# Patient Record
Sex: Female | Born: 1955 | Race: White | Hispanic: No | Marital: Married | Smoking: Never smoker
Health system: Southern US, Community
[De-identification: ages and names within clinical notes are randomized; demographics above are authoritative.]

## PROBLEM LIST (undated history)

## (undated) DIAGNOSIS — K219 Gastro-esophageal reflux disease without esophagitis: Secondary | ICD-10-CM

## (undated) DIAGNOSIS — E039 Hypothyroidism, unspecified: Secondary | ICD-10-CM

## (undated) DIAGNOSIS — I214 Non-ST elevation (NSTEMI) myocardial infarction: Secondary | ICD-10-CM

## (undated) DIAGNOSIS — F419 Anxiety disorder, unspecified: Secondary | ICD-10-CM

## (undated) DIAGNOSIS — G4730 Sleep apnea, unspecified: Secondary | ICD-10-CM

## (undated) DIAGNOSIS — I639 Cerebral infarction, unspecified: Secondary | ICD-10-CM

## (undated) DIAGNOSIS — G43909 Migraine, unspecified, not intractable, without status migrainosus: Secondary | ICD-10-CM

## (undated) DIAGNOSIS — I10 Essential (primary) hypertension: Secondary | ICD-10-CM

## (undated) DIAGNOSIS — I2510 Atherosclerotic heart disease of native coronary artery without angina pectoris: Secondary | ICD-10-CM

## (undated) DIAGNOSIS — M1990 Unspecified osteoarthritis, unspecified site: Secondary | ICD-10-CM

## (undated) HISTORY — PX: SHOULDER ARTHROSCOPY: SHX128

## (undated) HISTORY — PX: ABDOMINAL HYSTERECTOMY: SHX81

## (undated) HISTORY — PX: NASAL FRACTURE SURGERY: SHX718

## (undated) HISTORY — DX: Cerebral infarction, unspecified: I63.9

## (undated) HISTORY — PX: TONSILLECTOMY: SUR1361

## (undated) HISTORY — PX: CARDIAC CATHETERIZATION: SHX172

## (undated) HISTORY — DX: Non-ST elevation (NSTEMI) myocardial infarction: I21.4

---

## 1999-12-18 ENCOUNTER — Ambulatory Visit (HOSPITAL_COMMUNITY): Admission: RE | Admit: 1999-12-18 | Discharge: 1999-12-18 | Payer: Self-pay | Admitting: Family Medicine

## 2000-01-01 ENCOUNTER — Ambulatory Visit (HOSPITAL_COMMUNITY): Admission: RE | Admit: 2000-01-01 | Discharge: 2000-01-01 | Payer: Self-pay | Admitting: Family Medicine

## 2002-10-02 ENCOUNTER — Emergency Department (HOSPITAL_COMMUNITY): Admission: EM | Admit: 2002-10-02 | Discharge: 2002-10-02 | Payer: Self-pay | Admitting: Emergency Medicine

## 2002-10-02 ENCOUNTER — Encounter: Payer: Self-pay | Admitting: Emergency Medicine

## 2008-09-07 DIAGNOSIS — E559 Vitamin D deficiency, unspecified: Secondary | ICD-10-CM | POA: Insufficient documentation

## 2009-01-02 ENCOUNTER — Encounter: Admission: RE | Admit: 2009-01-02 | Discharge: 2009-01-02 | Payer: Self-pay | Admitting: Orthopedic Surgery

## 2009-03-12 DIAGNOSIS — I10 Essential (primary) hypertension: Secondary | ICD-10-CM | POA: Diagnosis present

## 2009-04-09 ENCOUNTER — Ambulatory Visit (HOSPITAL_BASED_OUTPATIENT_CLINIC_OR_DEPARTMENT_OTHER): Admission: RE | Admit: 2009-04-09 | Discharge: 2009-04-09 | Payer: Self-pay | Admitting: Orthopedic Surgery

## 2010-12-07 LAB — POCT I-STAT, CHEM 8
BUN: 10 mg/dL (ref 6–23)
Calcium, Ion: 1 mmol/L — ABNORMAL LOW (ref 1.12–1.32)
Chloride: 104 mEq/L (ref 96–112)
Creatinine, Ser: 0.7 mg/dL (ref 0.4–1.2)
Glucose, Bld: 90 mg/dL (ref 70–99)
HCT: 41 % (ref 36.0–46.0)
Hemoglobin: 13.9 g/dL (ref 12.0–15.0)
Potassium: 4 mEq/L (ref 3.5–5.1)
Sodium: 138 mEq/L (ref 135–145)
TCO2: 26 mmol/L (ref 0–100)

## 2011-01-14 NOTE — Op Note (Signed)
Kelly Warner, HAUTH             ACCOUNT NO.:  [id number]   MEDICAL RECORD NO.:  [id number]          PATIENT TYPE:  AMB   LOCATION:  DSC                          FACILITY:  [facility]   PHYSICIAN:  Feliberto Gottron. Turner Daniels, M.D.   DATE OF BIRTH:  08/21/1956   DATE OF PROCEDURE:  04/09/2009  DATE OF DISCHARGE:                               OPERATIVE REPORT   PREOPERATIVE DIAGNOSIS:  Right shoulder impingement syndrome.   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   PROCEDURE:  Right shoulder arthroscopic anterior-inferior acromioplasty,  formal distal clavicle excision.   SURGEON:  Feliberto Gottron. Turner Daniels, MD   FIRST ASSISTANT:  Shirl Harris, PA-C   ANESTHETIC:  Right shoulder interscalene block plus general  endotracheal.   ESTIMATED BLOOD LOSS:  Minimal.   FLUID REPLACEMENT:  1200 mL of crystalloid.   DRAINS PLACED:  None.   TOURNIQUET TIME:  None.   INDICATIONS FOR PROCEDURE:  The patient is a 55 year old woman with  right shoulder impingement syndrome and AC joint arthritis, treated by  one of my partners Dr. Renae Fickle until recently when Dr. Renae Fickle retired from  surgical practice.  Because of persistent pain, she desires elective  arthroscopic evaluation and treatment of right shoulder.  Preoperative  MRI scan done by Dr. Renae Fickle showed a partial-thickness rotator cuff tear,  but no full-thickness tears in this 55 year old woman.  She has failed  conservative treatment with anti-inflammatory medicines, physical  therapy, good temporary relief with cortisone injections x2 and now  desires elective arthroscopic evaluation, treatment of her shoulder with  decompression, removing a type 3 subacromial spur that is about a  centimeter in size as well as an arthritic AC joint with a subclavicular  spur.  The risks and benefits of surgery have been discussed, questions  answered.   DESCRIPTION OF PROCEDURE:  The patient was  identified by armband and  taken to operating room #1 at [facility] after she had  successful induction of right shoulder interscalene block in the block  area.  Once in operating room #1, the appropriate anesthetic monitors  were reattached and general endotracheal anesthesia induced with the  patient in supine position.  She was then placed in the beach-chair  position and the right upper extremity was prepped and draped in usual  sterile fashion from the wrist to the hemithorax.  Time-out procedure  performed and we began the operation itself by making standard portals  of 1.5 cm anterior to the [facility] joint lateral to the junction of middle and  posterior thirds of the acromion and posterior to the posterolateral  corner of the acromion process.  The inflow was placed anteriorly, the  arthroscope laterally and a 4.2 great white sucker shaver posteriorly  allowing subacromial bursectomy.  We was also used the ArthroCare wand  to delineate subacromial spur and release part of the CA ligament.  The  rotator cuff was evaluated and did not have a partial-thickness external  leaflet rotator cuff tear that involved for about 25% of the tendon  anterior leading edge.  We  then selected a 4.5 hooded vortex bur and  performed a subacromial decompression creating a type 1 flat acromion.  We also removed the subclavicular spur, switched portals, bringing the  scope in posteriorly, the inflow laterally and the bur anteriorly  completing a formal distal clavicle excision.  The arthroscope was then  repositioned into the glenohumeral joint using the posterior portal with  the inflow attached where we documented normal glenohumeral cartilage,  normal labrum, normal biceps, biceps anchor, and a normal internal  leaflets of the subscapularis, supraspinatus, and infraspinatus tendons.  At this point, the shoulder was irrigated out with normal saline  solution.  The arthroscopic instrument was  removed.  A dressing of  Xeroform, 4 x 4 dressing sponges, paper tape, and a sling was applied.  The patient was laid supine, awakened, and taken to the recovery room  without difficulty.      Feliberto Gottron. Turner Daniels, M.D.  Electronically Signed     FJR/MEDQ  D:  04/09/2009  T:  04/10/2009  Job:  [id number]

## 2015-01-09 ENCOUNTER — Other Ambulatory Visit: Payer: Self-pay | Admitting: Gastroenterology

## 2015-01-09 DIAGNOSIS — R110 Nausea: Secondary | ICD-10-CM

## 2015-01-12 ENCOUNTER — Ambulatory Visit (HOSPITAL_COMMUNITY)
Admission: RE | Admit: 2015-01-12 | Discharge: 2015-01-12 | Disposition: A | Discharge status: Home / Self Care | Payer: PRIVATE HEALTH INSURANCE | Source: Ambulatory Visit | Attending: Gastroenterology | Admitting: Gastroenterology

## 2015-01-12 DIAGNOSIS — R110 Nausea: Secondary | ICD-10-CM

## 2015-01-12 DIAGNOSIS — R109 Unspecified abdominal pain: Secondary | ICD-10-CM | POA: Insufficient documentation

## 2015-09-20 DIAGNOSIS — H93299 Other abnormal auditory perceptions, unspecified ear: Secondary | ICD-10-CM | POA: Insufficient documentation

## 2015-09-20 DIAGNOSIS — M8580 Other specified disorders of bone density and structure, unspecified site: Secondary | ICD-10-CM | POA: Insufficient documentation

## 2015-09-20 DIAGNOSIS — K219 Gastro-esophageal reflux disease without esophagitis: Secondary | ICD-10-CM | POA: Diagnosis present

## 2015-09-20 DIAGNOSIS — L821 Other seborrheic keratosis: Secondary | ICD-10-CM | POA: Insufficient documentation

## 2015-09-20 DIAGNOSIS — L814 Other melanin hyperpigmentation: Secondary | ICD-10-CM | POA: Insufficient documentation

## 2015-09-20 DIAGNOSIS — J309 Allergic rhinitis, unspecified: Secondary | ICD-10-CM | POA: Insufficient documentation

## 2015-09-20 DIAGNOSIS — E785 Hyperlipidemia, unspecified: Secondary | ICD-10-CM | POA: Diagnosis present

## 2015-09-20 DIAGNOSIS — G43909 Migraine, unspecified, not intractable, without status migrainosus: Secondary | ICD-10-CM | POA: Diagnosis present

## 2015-11-02 DIAGNOSIS — J0141 Acute recurrent pansinusitis: Secondary | ICD-10-CM | POA: Insufficient documentation

## 2016-09-01 DIAGNOSIS — I639 Cerebral infarction, unspecified: Secondary | ICD-10-CM

## 2016-09-01 HISTORY — DX: Cerebral infarction, unspecified: I63.9

## 2017-03-15 ENCOUNTER — Emergency Department (HOSPITAL_COMMUNITY): Payer: PRIVATE HEALTH INSURANCE

## 2017-03-15 ENCOUNTER — Inpatient Hospital Stay (HOSPITAL_COMMUNITY)
Admission: EM | Admit: 2017-03-15 | Discharge: 2017-03-17 | DRG: 065 | Disposition: A | Discharge status: Home / Self Care | Payer: PRIVATE HEALTH INSURANCE | Attending: Internal Medicine | Admitting: Internal Medicine

## 2017-03-15 ENCOUNTER — Inpatient Hospital Stay (HOSPITAL_COMMUNITY): Payer: PRIVATE HEALTH INSURANCE

## 2017-03-15 ENCOUNTER — Encounter (HOSPITAL_COMMUNITY): Payer: Self-pay | Admitting: *Deleted

## 2017-03-15 DIAGNOSIS — G43909 Migraine, unspecified, not intractable, without status migrainosus: Secondary | ICD-10-CM | POA: Diagnosis present

## 2017-03-15 DIAGNOSIS — R29701 NIHSS score 1: Secondary | ICD-10-CM | POA: Diagnosis present

## 2017-03-15 DIAGNOSIS — G8192 Hemiplegia, unspecified affecting left dominant side: Secondary | ICD-10-CM | POA: Diagnosis present

## 2017-03-15 DIAGNOSIS — K219 Gastro-esophageal reflux disease without esophagitis: Secondary | ICD-10-CM | POA: Diagnosis present

## 2017-03-15 DIAGNOSIS — I639 Cerebral infarction, unspecified: Secondary | ICD-10-CM | POA: Diagnosis present

## 2017-03-15 DIAGNOSIS — Z6832 Body mass index (BMI) 32.0-32.9, adult: Secondary | ICD-10-CM | POA: Diagnosis not present

## 2017-03-15 DIAGNOSIS — Z7982 Long term (current) use of aspirin: Secondary | ICD-10-CM | POA: Diagnosis not present

## 2017-03-15 DIAGNOSIS — E663 Overweight: Secondary | ICD-10-CM | POA: Diagnosis present

## 2017-03-15 DIAGNOSIS — Z823 Family history of stroke: Secondary | ICD-10-CM

## 2017-03-15 DIAGNOSIS — I10 Essential (primary) hypertension: Secondary | ICD-10-CM | POA: Diagnosis present

## 2017-03-15 DIAGNOSIS — I480 Paroxysmal atrial fibrillation: Secondary | ICD-10-CM | POA: Diagnosis present

## 2017-03-15 DIAGNOSIS — E785 Hyperlipidemia, unspecified: Secondary | ICD-10-CM | POA: Diagnosis present

## 2017-03-15 DIAGNOSIS — Z79899 Other long term (current) drug therapy: Secondary | ICD-10-CM

## 2017-03-15 DIAGNOSIS — R531 Weakness: Secondary | ICD-10-CM

## 2017-03-15 DIAGNOSIS — G43709 Chronic migraine without aura, not intractable, without status migrainosus: Secondary | ICD-10-CM | POA: Diagnosis not present

## 2017-03-15 DIAGNOSIS — Z90710 Acquired absence of both cervix and uterus: Secondary | ICD-10-CM

## 2017-03-15 HISTORY — DX: Essential (primary) hypertension: I10

## 2017-03-15 HISTORY — DX: Migraine, unspecified, not intractable, without status migrainosus: G43.909

## 2017-03-15 HISTORY — DX: Gastro-esophageal reflux disease without esophagitis: K21.9

## 2017-03-15 LAB — DIFFERENTIAL
Basophils Absolute: 0 10*3/uL (ref 0.0–0.1)
Basophils Relative: 0 %
Eosinophils Absolute: 0.1 10*3/uL (ref 0.0–0.7)
Eosinophils Relative: 1 %
LYMPHS PCT: 27 %
Lymphs Abs: 1.6 10*3/uL (ref 0.7–4.0)
MONO ABS: 0.4 10*3/uL (ref 0.1–1.0)
MONOS PCT: 7 %
NEUTROS ABS: 3.9 10*3/uL (ref 1.7–7.7)
Neutrophils Relative %: 65 %

## 2017-03-15 LAB — COMPREHENSIVE METABOLIC PANEL
ALK PHOS: 123 U/L (ref 38–126)
ALT: 17 U/L (ref 14–54)
AST: 20 U/L (ref 15–41)
Albumin: 3.8 g/dL (ref 3.5–5.0)
Anion gap: 8 (ref 5–15)
BILIRUBIN TOTAL: 0.8 mg/dL (ref 0.3–1.2)
BUN: 14 mg/dL (ref 6–20)
CALCIUM: 9.1 mg/dL (ref 8.9–10.3)
CO2: 25 mmol/L (ref 22–32)
CREATININE: 0.82 mg/dL (ref 0.44–1.00)
Chloride: 105 mmol/L (ref 101–111)
Glucose, Bld: 116 mg/dL — ABNORMAL HIGH (ref 65–99)
Potassium: 3.9 mmol/L (ref 3.5–5.1)
Sodium: 138 mmol/L (ref 135–145)
Total Protein: 6.9 g/dL (ref 6.5–8.1)

## 2017-03-15 LAB — I-STAT CHEM 8, ED
BUN: 16 mg/dL (ref 6–20)
CREATININE: 0.7 mg/dL (ref 0.44–1.00)
Calcium, Ion: 1.1 mmol/L — ABNORMAL LOW (ref 1.15–1.40)
Chloride: 103 mmol/L (ref 101–111)
GLUCOSE: 114 mg/dL — AB (ref 65–99)
HCT: 44 % (ref 36.0–46.0)
HEMOGLOBIN: 15 g/dL (ref 12.0–15.0)
POTASSIUM: 3.9 mmol/L (ref 3.5–5.1)
Sodium: 140 mmol/L (ref 135–145)
TCO2: 25 mmol/L (ref 0–100)

## 2017-03-15 LAB — APTT: aPTT: 30 seconds (ref 24–36)

## 2017-03-15 LAB — CBC
HEMATOCRIT: 43.9 % (ref 36.0–46.0)
Hemoglobin: 14.5 g/dL (ref 12.0–15.0)
MCH: 29.1 pg (ref 26.0–34.0)
MCHC: 33 g/dL (ref 30.0–36.0)
MCV: 88.2 fL (ref 78.0–100.0)
Platelets: 288 10*3/uL (ref 150–400)
RBC: 4.98 MIL/uL (ref 3.87–5.11)
RDW: 14.1 % (ref 11.5–15.5)
WBC: 6 10*3/uL (ref 4.0–10.5)

## 2017-03-15 LAB — I-STAT TROPONIN, ED: TROPONIN I, POC: 0.01 ng/mL (ref 0.00–0.08)

## 2017-03-15 LAB — PROTIME-INR
INR: 1.08
Prothrombin Time: 14.1 seconds (ref 11.4–15.2)

## 2017-03-15 LAB — CBG MONITORING, ED: Glucose-Capillary: 114 mg/dL — ABNORMAL HIGH (ref 65–99)

## 2017-03-15 MED ORDER — PANTOPRAZOLE SODIUM 40 MG PO TBEC
40.0000 mg | DELAYED_RELEASE_TABLET | Freq: Every day | ORAL | Status: DC
  Administered 2017-03-16 – 2017-03-17 (×2): 40 mg via ORAL
  Filled 2017-03-15 (×2): qty 1

## 2017-03-15 MED ORDER — ACETAMINOPHEN 325 MG PO TABS
650.0000 mg | ORAL_TABLET | ORAL | Status: DC | PRN
  Administered 2017-03-15 – 2017-03-16 (×4): 650 mg via ORAL
  Filled 2017-03-15 (×4): qty 2

## 2017-03-15 MED ORDER — ATENOLOL 25 MG PO TABS
12.5000 mg | ORAL_TABLET | Freq: Every day | ORAL | Status: DC
  Administered 2017-03-16: 12.5 mg via ORAL
  Filled 2017-03-15: qty 1

## 2017-03-15 MED ORDER — ACETAMINOPHEN 160 MG/5ML PO SOLN: 650.0000 mg | ORAL | Status: DC | PRN

## 2017-03-15 MED ORDER — STROKE: EARLY STAGES OF RECOVERY BOOK
Freq: Once | Status: AC
  Administered 2017-03-16: 03:00:00
  Filled 2017-03-15: qty 1

## 2017-03-15 MED ORDER — IOPAMIDOL (ISOVUE-370) INJECTION 76%
INTRAVENOUS | Status: AC
  Administered 2017-03-15: 50 mL
  Filled 2017-03-15: qty 50

## 2017-03-15 MED ORDER — ASPIRIN EC 81 MG PO TBEC: 81.0000 mg | DELAYED_RELEASE_TABLET | Freq: Every day | ORAL | Status: DC

## 2017-03-15 MED ORDER — ATORVASTATIN CALCIUM 40 MG PO TABS
40.0000 mg | ORAL_TABLET | Freq: Every day | ORAL | Status: DC
  Filled 2017-03-15: qty 1

## 2017-03-15 MED ORDER — SENNOSIDES-DOCUSATE SODIUM 8.6-50 MG PO TABS
1.0000 | ORAL_TABLET | Freq: Every evening | ORAL | Status: DC | PRN
  Filled 2017-03-15: qty 1

## 2017-03-15 MED ORDER — ATORVASTATIN CALCIUM 80 MG PO TABS: 80.0000 mg | ORAL_TABLET | Freq: Every day | ORAL | Status: DC

## 2017-03-15 MED ORDER — ENOXAPARIN SODIUM 40 MG/0.4ML ~~LOC~~ SOLN
40.0000 mg | SUBCUTANEOUS | Status: DC
  Administered 2017-03-16: 40 mg via SUBCUTANEOUS
  Filled 2017-03-15: qty 0.4

## 2017-03-15 MED ORDER — ACETAMINOPHEN 650 MG RE SUPP: 650.0000 mg | RECTAL | Status: DC | PRN

## 2017-03-15 MED ORDER — CITALOPRAM HYDROBROMIDE 10 MG PO TABS
20.0000 mg | ORAL_TABLET | Freq: Every day | ORAL | Status: DC
  Administered 2017-03-16 – 2017-03-17 (×2): 20 mg via ORAL
  Filled 2017-03-15 (×2): qty 2

## 2017-03-15 MED ORDER — CYCLOBENZAPRINE HCL 10 MG PO TABS: 5.0000 mg | ORAL_TABLET | Freq: Three times a day (TID) | ORAL | Status: DC | PRN

## 2017-03-15 MED ORDER — FLUTICASONE PROPIONATE 50 MCG/ACT NA SUSP
2.0000 | Freq: Every day | NASAL | Status: DC | PRN
  Filled 2017-03-15: qty 16

## 2017-03-15 NOTE — ED Notes (Signed)
Neurologist at bedside. 

## 2017-03-15 NOTE — ED Notes (Signed)
Patient transported to CT 

## 2017-03-15 NOTE — Progress Notes (Signed)
New Admission Note:  Arrival Method: stretcher from ER Mental Orientation: Alert & oriented x 4  Telemetry:box [id number] Assessment: Completed Skin: assessment complete IV: R AC Pain:5/10; pt medicated see MAR Safety Measures: Safety Fall Prevention Plan was given, discussed. Admission: Completed 5M10: Patient has been orientated to the room, unit and the staff. Family: husband at beside   Orders have been reviewed and implemented. Will continue to monitor the patient. Call light has been placed within reach and bed alarm has been activated.   Arta Silence ,RN

## 2017-03-15 NOTE — H&P (Signed)
Date: 03/15/2017               Patient Name:  Kelly Warner MRN: [id number]  DOB: Aug 23, 1956 Age / Sex: 61 y.o., female   PCP: System, Pcp Not In         Medical Service: Internal Medicine Teaching Service         Attending Physician: Dr. Sherry Ruffing, Gwenyth Allegra, *    First Contact: Dr. Maricela Bo Pager: [phone]  Second Contact: Dr. Benjamine Mola Pager: [phone]       After Hours (After 5p/  First Contact Pager: [phone]  weekends / holidays): Second Contact Pager: [phone]   Chief Complaint: Extremity weakness   History of Present Illness:   Kelly Warner is a 61 y.o f with pmh of htn, migraine headaches, and gerd who presented to the ed following an episode of acute lower extremity weakness. Kelly Warner woke up this morning at 4/5am because she had a headache and neck pain. She put an ice pack on her head and subsequently went to sleep. After waking up her and her husband decided to go on a walk with Kelly Warner around 8am. As Kelly Warner was walking she heard her feet dragging and she felt that both her left arm and leg felt rubbery. She denies any dizziness, numbness or tingling. She does say that she felt weak and would have possibly fallen if not for holding as stroller. She states she took an aspirn 325mg  and then subsequently came to the emergency room.   Kelly Warner states that she has been having a headache for the past week that is 6-7/10 intensity, in the left frontal area, pressure type pain, worsens with loud noises and light. She got a massage last week in attempt to alleviate the pain, but that did not help. She uses zolmitriptan 5mg  for her headaches which she last used on Thursday 03/12/17.  Kelly Warner states has not had a stroke in the past. She states that she is compliant with all her medication.   ED Course- Ct head and MRI brain, EKG, neurology consult  Meds:  Current Meds  Medication Sig  . aspirin EC 325 MG tablet Take 325 mg by mouth daily as needed for mild pain.    Marland Kitchen atenolol (TENORMIN) 25 MG tablet Take 12.5 mg by mouth daily.   . citalopram (CELEXA) 20 MG tablet Take 20 mg by mouth daily.  . cyclobenzaprine (FLEXERIL) 5 MG tablet Take 5 mg by mouth 3 (three) times daily as needed for muscle spasms.   Marland Kitchen loratadine (CLARITIN) 10 MG tablet Take 10 mg by mouth daily as needed for allergies.  . mometasone (NASONEX) 50 MCG/ACT nasal spray Place 2 sprays into the nose daily as needed (congestion).  . naproxen sodium (ANAPROX) 220 MG tablet Take 220 mg by mouth daily as needed (pain).  . pantoprazole (PROTONIX) 40 MG tablet Take 40 mg by mouth daily.  Marland Kitchen zolmitriptan (ZOMIG) 5 MG tablet Take 5 mg by mouth as needed for migraine.  . [DISCONTINUED] mometasone (NASONEX) 50 MCG/ACT nasal spray 2 SPRAYS BY NASAL ROUTE DAILY prn     Allergies: Allergies as of 03/15/2017  . (No Known Allergies)   Past Medical History:  Diagnosis Date  . Hypertension   . Migraines     Family History:   Mother-afib, dementia, hemorrhagic stroke at age 38 with dementia, htn   Father- vfib at age 62, htn  Brother-htn  Sister-htn  Social History:  Denies smoking  and illicit drugs Drinks alcohol occasionally  Review of Systems: A complete ROS was negative except as per HPI.  Physical Exam: Blood pressure 123/72, pulse (!) 56, temperature 98.2 F (36.8 C), temperature source Oral, resp. rate 16, height 5\' 4"  (1.626 m), weight 180 lb (81.6 kg), SpO2 96 %.  Physical Exam  Constitutional: She appears distressed.  HENT:  Head: Normocephalic and atraumatic.  Cardiovascular: Normal rate, regular rhythm, normal heart sounds and intact distal pulses.   Pulmonary/Chest: Effort normal and breath sounds normal. No respiratory distress. She has no wheezes. She exhibits no tenderness.  Abdominal: Soft. Bowel sounds are normal. She exhibits no distension. There is no tenderness.  Neurological: She is alert. She displays normal reflexes. No cranial nerve deficit. Coordination  normal.  Reflex Scores:      Patellar reflexes are 2+ on the right side and 2+ on the left side.      Achilles reflexes are 2+ on the right side and 2+ on the left side. Right upper and lower extremity strength 5/5 Left upper and lower extremity strength 4/5  Psychiatric: Mood, memory, affect and judgment normal.    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    Assessment & Plan by Problem:  Acute Ischemic Stroke: The patient's age and htn are risk factors for stroke. She does not have any large deficits on exam except for slight weakness on her left upper and lower extremities. She has no history of coagulopathy, dm, or hyperlipidemia. In order to further assess cause of the stoke we have ordered  - CTangio head and neck, lipid panel, HbA1c, and echocardiogram.  Her family history of atrial fibrillation causes suspicion as to whether a paroxysmal atrial fibrillation caused her stroke. Furthermore, the patient has been on zolmitriptan. There has been some research which show the usage of triptans being linked to onset of ischemic stroke due to the vasoconstricting properties of triptan agents. It is also thought that migraine can also independently be a possible cause of stroke due to vasospasm causing changes in cerebral blood flow, increased platelet aggregability, and endothelial abnormalities.   -stop zolmitriptan due to contraindication in ischemic strokes -PT/OT -Lovenox 40mg   -Neuro checks q 2hrs x12 hrs  HTN: Blood pressure has ranged 117-140/73-76 over the past 24 hrs. Contineue atenolol 12.5mg   GERD: Continue with home pantoprazole   Dispo: Admit patient to Inpatient with expected length of stay greater than 2 midnights.  Signed: Lars Mage, MD Internal Medicine PGY1 [phone] 03/15/2017, 6:02 PM

## 2017-03-15 NOTE — ED Triage Notes (Signed)
Pt with L sided weakness "feels rubbery", acute onset at 0830 this am.  No deficits noted on exam.  Hx of migraines with L parietal headache x 1 week that improved with massage.

## 2017-03-15 NOTE — ED Notes (Signed)
Patient transported to MRI 

## 2017-03-15 NOTE — Consult Note (Addendum)
Referring Physician: Dr Leonides Schanz    Chief Complaint:  Left-sided weakness  HPI: Kelly Warner is a 61 y.o. female  with a history of mild hypertension and migraine headaches. Last week the patient had neck and occipital pain which was different from her usual migraine headaches. She did take migraine medication with no relief. She finally put an ice pack to the area. She had intermittent relief. This morning she awoke at approximately 4 or 5 AM again with neck and occipital pain. She applied an ice pack and fell back to sleep. She later got up at around 8 AM and with her husband and granddaughter went for a short walk outside. She noticed that her left arm and left leg felt heavy. She began to drag her left foot. They went back to the house. She took an aspirin 325 mgs and went to lie on the couch. She was still having symptoms after she had rested. They decided to come to the emergency room for further evaluation. Currently she feels that her left arm is back to normal. She still feels heaviness in her left leg. She is not on antiplatelet therapy on a regular basis. She takes aspirin as needed for mild discomfort. An MRI revealed an acute nonhemorrhagic 8 mm white matter infarct within the right corona and radiata, potentially involving the cortical spinal tracts.   Date last known well: Date: 03/15/2017 Time last known well: Time: 05:00 tPA Given: No: Minimal deficits which appear to be improving.  Past Medical History:  Diagnosis Date  . Hypertension   . Migraines     Past Surgical History:  Procedure Laterality Date  . ABDOMINAL HYSTERECTOMY    . NASAL FRACTURE SURGERY    . SHOULDER ARTHROSCOPY Right   . TONSILLECTOMY      No family history on file. The patient's mother had a hemorrhagic stroke in her 45s. Her mother also had atrial fibrillation. Social History:  reports that she has never smoked. She has never used smokeless tobacco. She reports that she drinks alcohol. She reports that  she does not use drugs.  Allergies: No Known Allergies  Medications:  Current Facility-Administered Medications  Medication Dose Route Frequency Provider Last Rate Last Dose  .  stroke: mapping our early stages of recovery book   Does not apply Once Collier Salina, MD      . acetaminophen (TYLENOL) tablet 650 mg  650 mg Oral Q4H PRN Rice, Resa Miner, MD       Or  . acetaminophen (TYLENOL) solution 650 mg  650 mg Per Tube Q4H PRN Rice, Resa Miner, MD       Or  . acetaminophen (TYLENOL) suppository 650 mg  650 mg Rectal Q4H PRN Collier Salina, MD      . Derrill Memo ON 03/16/2017] aspirin EC tablet 81 mg  81 mg Oral Daily Rice, Resa Miner, MD      . atenolol (TENORMIN) tablet 12.5 mg  12.5 mg Oral Daily Rice, Resa Miner, MD      . citalopram (CELEXA) tablet 20 mg  20 mg Oral Daily Rice, Resa Miner, MD      . cyclobenzaprine (FLEXERIL) tablet 5 mg  5 mg Oral TID PRN Collier Salina, MD      . enoxaparin (LOVENOX) injection 40 mg  40 mg Subcutaneous Q24H Rice, Resa Miner, MD      . fluticasone (FLONASE) 50 MCG/ACT nasal spray 2 spray  2 spray Each Nare Daily PRN Collier Salina,  MD      . pantoprazole (PROTONIX) EC tablet 40 mg  40 mg Oral Daily Rice, Resa Miner, MD      . senna-docusate (Senokot-S) tablet 1 tablet  1 tablet Oral QHS PRN Collier Salina, MD       Current Outpatient Prescriptions  Medication Sig Dispense Refill  . aspirin EC 325 MG tablet Take 325 mg by mouth daily as needed for mild pain.    Marland Kitchen atenolol (TENORMIN) 25 MG tablet Take 12.5 mg by mouth daily.     . citalopram (CELEXA) 20 MG tablet Take 20 mg by mouth daily.    . cyclobenzaprine (FLEXERIL) 5 MG tablet Take 5 mg by mouth 3 (three) times daily as needed for muscle spasms.   0  . loratadine (CLARITIN) 10 MG tablet Take 10 mg by mouth daily as needed for allergies.    . mometasone (NASONEX) 50 MCG/ACT nasal spray Place 2 sprays into the nose daily as needed (congestion).    .  naproxen sodium (ANAPROX) 220 MG tablet Take 220 mg by mouth daily as needed (pain).    . pantoprazole (PROTONIX) 40 MG tablet Take 40 mg by mouth daily.  3  . zolmitriptan (ZOMIG) 5 MG tablet Take 5 mg by mouth as needed for migraine.     I have reviewed the patient's current medications.  ROS: History obtained from the patient  General ROS: negative for - chills, fatigue, fever, night sweats, weight gain or weight loss. Positive for recent occipital headaches and neck pain. Psychological ROS: negative for - behavioral disorder, hallucinations, memory difficulties, mood swings or suicidal ideation Ophthalmic ROS: negative for - blurry vision, double vision, eye pain or loss of vision. Positive for recent blurred vision in her left eye. Seen by her ophthalmologist who told her she had a corneal ulcer. ENT ROS: negative for - epistaxis, nasal discharge, oral lesions, sore throat, tinnitus or vertigo Allergy and Immunology ROS: negative for - hives or itchy/watery eyes Hematological and Lymphatic ROS: negative for - bleeding problems, bruising or swollen lymph nodes Endocrine ROS: negative for - galactorrhea, hair pattern changes, polydipsia/polyuria or temperature intolerance Respiratory ROS: negative for - cough, hemoptysis, shortness of breath or wheezing. Positive for shortness of breath during a hike last weekend with some mild chest discomfort. Cardiovascular ROS: negative for - chest pain, dyspnea on exertion, edema or irregular heartbeat Gastrointestinal ROS: negative for - abdominal pain, diarrhea, hematemesis, nausea/vomiting or stool incontinence Genito-Urinary ROS: negative for - dysuria, hematuria, incontinence or urinary frequency/urgency Musculoskeletal ROS: negative for - joint swelling or muscular weakness Neurological ROS: as noted in HPI Dermatological ROS: negative for rash and skin lesion changes   Physical Examination: Blood pressure 123/72, pulse (!) 56, temperature  98.2 F (36.8 C), temperature source Oral, resp. rate 16, height 5\' 4"  (1.626 m), weight 81.6 kg (180 lb), SpO2 96 %.  General - pleasant 61 year old female in no acute distress Heart - Regular rate and rhythm - no murmer appreciated Lungs - Clear to auscultation Abdomen - Soft - non tender Extremities - Distal pulses intact - no edema Skin - Warm and dry   Neurologic Examination:  Mental Status:  Alert, oriented, thought content appropriate. Speech without evidence of dysarthria or aphasia. Able to follow 3 step commands without difficulty.  Cranial Nerves:  II-bilateral visual fields intact III/IV/VI-Pupils were equal and reacted. Extraocular movements were full.  V/VII-no facial numbness and no facial weakness.  VIII-hearing normal.  X-normal speech and symmetrical palatal movement.  XII-midline tongue extension  Motor: 5/5 strength symmetrical throughout.  Muscle tone normal throughout. Sensory: Sensation decreased to light touch in the left upper and lower extremities.. Deep Tendon Reflexes: 2/4 throughout Plantars: Downgoing bilaterally  Cerebellar: Normal finger to nose and heel to shin bilaterally. Gait: not tested   Laboratory Studies:  Basic Metabolic Panel:  Recent Labs Lab 03/15/17 1029 03/15/17 1045  NA 138 140  K 3.9 3.9  CL 105 103  CO2 25  --   GLUCOSE 116* 114*  BUN 14 16  CREATININE 0.82 0.70  CALCIUM 9.1  --     Liver Function Tests:  Recent Labs Lab 03/15/17 1029  AST 20  ALT 17  ALKPHOS 123  BILITOT 0.8  PROT 6.9  ALBUMIN 3.8   No results for input(s): LIPASE, AMYLASE in the last 168 hours. No results for input(s): AMMONIA in the last 168 hours.  CBC:  Recent Labs Lab 03/15/17 1029 03/15/17 1045  WBC 6.0  --   NEUTROABS 3.9  --   HGB 14.5 15.0  HCT 43.9 44.0  MCV 88.2  --   PLT 288  --     Cardiac Enzymes: No results for input(s): CKTOTAL, CKMB, CKMBINDEX, TROPONINI in the last 168 hours.  BNP: Invalid input(s):  POCBNP  CBG:  Recent Labs Lab 03/15/17 1050  GLUCAP 114*    Microbiology: No results found for this or any previous visit.  Coagulation Studies:  Recent Labs  03/15/17 1029  LABPROT 14.1  INR 1.08    Urinalysis: No results for input(s): COLORURINE, LABSPEC, PHURINE, GLUCOSEU, HGBUR, BILIRUBINUR, KETONESUR, PROTEINUR, UROBILINOGEN, NITRITE, LEUKOCYTESUR in the last 168 hours.  Invalid input(s): APPERANCEUR  Lipid Panel: No results found for: CHOL, TRIG, HDL, CHOLHDL, VLDL, LDLCALC  HgbA1C: No results found for: HGBA1C  Urine Drug Screen:  No results found for: LABOPIA, COCAINSCRNUR, LABBENZ, AMPHETMU, THCU, LABBARB  Alcohol Level: No results for input(s): ETH in the last 168 hours.  Other results: EKG: Sinus bradycardia rate 59 bpm. No obvious ischemia. Please refer to the formal cardiology reading for complete details.  Imaging:  Ct Head Wo Contrast 03/15/2017 No significant abnormality.   Mr Brain Wo Contrast 03/15/2017 1. Acute nonhemorrhagic 8 mm white matter infarct within the right corona and radiata, potentially involving the cortical spinal tracts.  2. No other acute intracranial abnormality or evidence for significant previous ischemia.       Assessment: 61 y.o. female with a history of hypertension and migraine headaches who developed left-sided heaviness / weakness while outside walking with her husband and granddaughter this morning between 8:30 and 9 AM. Her left upper extremity has improved. Her left lower extremity still feels heavy although strength appears to be equal throughout on exam. She has mildly decreased sensation in both the left upper and lower extremities. MRI is consistent with a right corona radiata infarct. The patient will be admitted by the teaching service for a full stroke workup. Dr. Rory Percy recommended a CT angiogram of the head and neck which has been ordered.  Stroke Risk Factors - hypertension, overweight, migraine headaches,  and family history. Lipid status unknown.  Plan: HgbA1c, fasting lipid panel PT consult, OT consult, Speech consult Echocardiogram CTA head and neck Prophylactic therapy- aspirin 325 mg daily NPO until RN stroke swallow screen Telemetry monitoring Frequent neuro checks   Further impression and plan to follow per Dr. Caryl Comes Rinehuls PA-C [facility] Pager [phone] 03/15/2017, 5:21 PM   NEUROHOSPITALIST ADDENDUM  Patient seen and examined in the emergency room. Please see the detailed history documented above. Briefly, 61 year old woman with a history of hypertension and migraine headaches who had some ongoing headache for about a week woke up this morning about 5 AM with a headache, went about her day and during the course of the day felt heaviness in the left leg and felt that she was dragging her left foot while walking. She returned back to baseline. She took aspirin and went to rest. She continued to have some symptoms including some sensory changes and that prompted her to come to the emergency room. Last known well-unclear, but greater than 12 hours ago. Not a candidate for TPA due to being outside the time window a  Review of systems documented above positive for headaches, blurred vision because of her corneal ulcer, or is negative. A full 14 point review of systems was conducted.  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  Family history significant for hemorrhagic stroke in the mother.  On examination, her vital signs are stable. Well-developed well-nourished acute distress S1-S2 heard, regular rate rhythm no murmur or gallop Chest clear to auscultation Soft nontender abdomen Warm well-perfused extremities with normal pulses Neurological exam She is alert, awake oriented 3. Normal speech. No dysarthria. No aphasia. Following all commands. Cranial nerve exam: Pupils equal round reactive to  light, visual fields full, movements intact, face symmetric, facial sensation intact bilaterally, palate elevates symmetrically shoulder shrug intact and tongue is midline. Motor exam: 5/5 strength in all 4 extremities with normal tone and normal range of motion Sensory exam reveals decreased sensation to light touch on the left upper and lower extremity. Coordination intact finger-nose-finger and heel shin Deep tendon reflexes: 2+ all over, downgoing toes bilaterally. Gait was not tested at this time.  Pertinent labs reviewed: Unremarkable BMP and CBC.  Imaging: Noncontrast CT of the head shows no acute changes. No bleeds MRI of the brain without contrast shows a small area of restricted diffusion in the right corona radiata. No other infarcts noted. No evidence of bleed.  Medication list Aspirin, atenolol, citalopram, Flexeril, loratadine, Nasonex, Naprosyn, metoprolol, Zomig  NIHSS 1a Level of Conscious.:  1b LOC Questions:  1c LOC Commands:  2 Best Gaze:  3 Visual:  4 Facial Palsy:  5a Motor Arm - left:  5b Motor Arm - Right:  6a Motor Leg - Left:  6b Motor Leg - Right:  7 Limb Ataxia:  8 Sensory: 1 9 Best Language:  10 Dysarthria:  11 Extinct. and Inatten.:  TOTAL: 1  Assessment and plan 61 year old with a history of hypertension and migraine, who is also on triptan presented with left-sided weakness and paresthesias. On exam, she does have some sensory loss on the left hemibody. NIH stroke scale 01. Mild symptoms. She is not a candidate for IV TPA because her symptoms started sometime yesterday or this morning and she is well outside the 4-1/2 hour window of TPA administration. She does not have any cortical signs or evidence to suggest large vessel occlusion and has not a candidate for endovascular treatment.  The etiology of her stroke is small vessel versus embolic. Triptan as an etiology for stroke should also be considered and at this point I would recommend  discontinuing the triptan.  Impression Acute ischemic stroke Hypertension Migraine  Recommendations  1. HgbA1c, fasting lipid panel 2. CTA of the  head and neck 3. Frequent neuro checks 4. Echocardiogram  5. Carotid dopplers 6. Prophylactic therapy-Antiplatelet med: Aspirin - dose 325mg  PO or 300mg  PR. And statin - atorvastatin 80 7. Risk factor modification 8. Telemetry monitoring 9. PT consult, OT consult, Speech consult 10. D/C Triptan (Zomig)  Please page stroke NP  Or  PA  Or MD  from 8am -4 pm as this patient will be followed by the stroke team at this point.   You can look them up on [email or web address]   Amie Portland, MD [facility] [phone]  If 7pm to 7am, please call on call as listed on AMION.

## 2017-03-15 NOTE — ED Provider Notes (Signed)
[facility] DEPT Provider Note   CSN: [id number] Arrival date & time: 03/15/17  1016     History   Chief Complaint Chief Complaint  Patient presents with  . Extremity Weakness    HPI CLAUDETT BAYLY is a 61 y.o. female.  The history is provided by the patient and medical records. No language interpreter was used.  Extremity Weakness  Associated symptoms include headaches.   OKLA QAZI is a 61 y.o. female  with a PMH of HTN, migraines who presents to the Emergency Department complaining of acute onset of left upper and lower extremity weakness around 8:30 am this morning. Patient states that she awoke around 5:30 this morning with a headache. She has a history of migraines and headache felt similar. She applied ice which mildly improved symptoms and returned to bed. She is having no weakness at this time. When she awoke, she was not experiencing any weakness either. She and her husband went on a walk. Halfway through her walk, she felt as if her left arm and leg "started feeling rubbery". She will notice that she was slightly dragging her left leg while walking, however was able to continue ambulating back to the car. Her husband stated that he thought out carrying her because she was going slow, dragging her left foot and complaining that her left arm was weak as well, however she was able to get back to the car without any falls. Husband denies any changes in her speech. Patient also denies any slurred speech, but does state that while in triage she felt as if she was having to getting her words out. This has now resolved. Her symptoms are improving, however her left arm still feels slightly weak. She did take a 324 ASA prior to arrival. No other medications. She never experienced any numbness or tingling. She has never had similar signs or symptoms. No prior stroke. No hx of coagulopathy, HLD or DM.   Past Medical History:  Diagnosis Date  . Hypertension   . Migraines      Patient Active Problem List   Diagnosis Date Noted  . Acute recurrent pansinusitis 11/02/2015  . Allergic rhinitis 09/20/2015  . Hyperlipidemia 09/20/2015  . Laryngopharyngeal reflux (LPR) 09/20/2015  . Lentigo 09/20/2015  . Migraine headache 09/20/2015  . Osteopenia 09/20/2015  . Other abnormal auditory perceptions, unspecified ear 09/20/2015  . Other seborrheic keratosis 09/20/2015  . Essential hypertension 03/12/2009  . Vitamin D deficiency 09/07/2008    Past Surgical History:  Procedure Laterality Date  . ABDOMINAL HYSTERECTOMY    . NASAL FRACTURE SURGERY    . SHOULDER ARTHROSCOPY Right   . TONSILLECTOMY      OB History    No data available       Home Medications    Prior to Admission medications   Medication Sig Start Date End Date Taking? Authorizing Provider  aspirin EC 325 MG tablet Take 325 mg by mouth daily as needed for mild pain.   Yes [provider]  atenolol (TENORMIN) 25 MG tablet Take 12.5 mg by mouth daily.  10/13/16  Yes [provider]  citalopram (CELEXA) 20 MG tablet Take 20 mg by mouth daily. 12/31/16  Yes [provider]  cyclobenzaprine (FLEXERIL) 5 MG tablet Take 5 mg by mouth 3 (three) times daily as needed for muscle spasms.  12/31/16  Yes [provider]  loratadine (CLARITIN) 10 MG tablet Take 10 mg by mouth daily as needed for allergies.   Yes [provider]  mometasone (NASONEX) 50 MCG/ACT nasal spray Place 2 sprays into the nose daily as needed (congestion).   Yes [provider]  naproxen sodium (ANAPROX) 220 MG tablet Take 220 mg by mouth daily as needed (pain).   Yes [provider]  pantoprazole (PROTONIX) 40 MG tablet Take 40 mg by mouth daily. 02/04/17  Yes [provider]  zolmitriptan (ZOMIG) 5 MG tablet Take 5 mg by mouth as needed for migraine. 12/31/16  Yes [provider]    Family History History reviewed. No pertinent family history.  Social  History Social History  Substance Use Topics  . Smoking status: Never Smoker  . Smokeless tobacco: Never Used  . Alcohol use Yes     Comment: occ     Allergies   Patient has no known allergies.   Review of Systems Review of Systems  Musculoskeletal: Positive for extremity weakness.  Neurological: Positive for weakness and headaches. Negative for dizziness, syncope, facial asymmetry and numbness.  All other systems reviewed and are negative.    Physical Exam Updated Vital Signs BP 123/72   Pulse (!) 56   Temp 98.2 F (36.8 C) (Oral)   Resp 16   Ht 5\' 4"  (1.626 m)   Wt 81.6 kg (180 lb)   SpO2 96%   BMI 30.90 kg/m   Physical Exam  Constitutional: She is oriented to person, place, and time. She appears well-developed and well-nourished. No distress.  HENT:  Head: Normocephalic and atraumatic.  Cardiovascular: Normal rate, regular rhythm and normal heart sounds.   No murmur heard. Pulmonary/Chest: Effort normal and breath sounds normal. No respiratory distress.  Abdominal: Soft. She exhibits no distension. There is no tenderness.  Musculoskeletal: Normal range of motion. She exhibits no edema.  Neurological: She is alert and oriented to person, place, and time.  Alert, oriented, thought content appropriate, able to give a coherent history. Speech is clear and goal oriented, able to follow commands.  Cranial Nerves:  II:  Pupils equal, round, reactive to light III, IV, VI: EOM intact bilaterally, ptosis not present V,VII: smile symmetric, eyes kept closed tightly against resistance, facial light touch sensation equal VIII: hearing grossly normal IX, X: symmetric soft palate movement, uvula elevates symmetrically  XI: bilateral shoulder shrug symmetric and strong XII: midline tongue extension 5/5 muscle strength in upper and lower extremities bilaterally including strong and equal grip strength and dorsiflexion/plantar flexion Sensory to light touch normal in all four  extremities.  Normal finger-to-nose and rapid alternating movements;  no drift.  Skin: Skin is warm and dry.  Nursing note and vitals reviewed.    ED Treatments / Results  Labs (all labs ordered are listed, but only abnormal results are displayed) Labs Reviewed  COMPREHENSIVE METABOLIC PANEL - Abnormal; Notable for the following:       Result Value   Glucose, Bld 116 (*)    All other components within normal limits  CBG MONITORING, ED - Abnormal; Notable for the following:    Glucose-Capillary 114 (*)    All other components within normal limits  I-STAT CHEM 8, ED - Abnormal; Notable for the following:    Glucose, Bld 114 (*)    Calcium, Ion 1.10 (*)    All other components within normal limits  PROTIME-INR  APTT  CBC  DIFFERENTIAL  I-STAT TROPOININ, ED    EKG  EKG Interpretation  Date/Time:  Sunday March 15 2017 10:21:51 EDT Ventricular Rate:  59 PR Interval:  190 QRS Duration:  82 QT Interval:  442 QTC Calculation: 437 R Axis:   12 Text Interpretation:  Sinus bradycardia Low voltage QRS Borderline ECG No prior ECG.  No STEMI Confirmed by Antony Blackbird [phone]) on 03/15/2017 12:20:00 PM       Radiology Ct Head Wo Contrast  Result Date: 03/15/2017 CLINICAL DATA:  Headache for the past week. Left arm numbness this morning. Left neck pain. EXAM: CT HEAD WITHOUT CONTRAST TECHNIQUE: Contiguous axial images were obtained from the base of the skull through the vertex without intravenous contrast. COMPARISON:  Paranasal sinus CT dated 11/18/2006. FINDINGS: Brain: No evidence of acute infarction, hemorrhage, hydrocephalus, extra-axial collection or mass lesion/mass effect. Vascular: No hyperdense vessel or unexpected calcification. Skull: Normal. Negative for fracture or focal lesion. Sinuses/Orbits: Unremarkable. Other: Mild concha bullosa on the left. IMPRESSION: No significant abnormality. Electronically Signed   By: Claudie Revering M.D.   On: 03/15/2017 12:02   Mr Brain Wo  Contrast  Result Date: 03/15/2017 CLINICAL DATA:  Acute onset of left-sided weakness beginning at 8:30 a.m. today. Personal history of migraine headaches with left parietal headache over the last 1 week. EXAM: MRI HEAD WITHOUT CONTRAST TECHNIQUE: Multiplanar, multiecho pulse sequences of the brain and surrounding structures were obtained without intravenous contrast. COMPARISON:  CT head without contrast from the same day at 11:35 a.m. FINDINGS: Brain: Acute nonhemorrhagic 8 mm white matter infarct is present within the right corona radiata, potentially involving the cortical spinal tracts. Subtle T2 changes are associated. Minimal periventricular T2 change is present otherwise. Minimal T2 hyperintensity is present in the right paramedian pons. Brainstem and cerebellum are otherwise within normal limits. No acute hemorrhage or mass lesion is present. The ventricles are of normal size. No significant extra-axial fluid collection is present. Vascular: Flow is present in the major intracranial arteries. Skull and upper cervical spine: The skullbase is within normal limits. The craniocervical junction is within normal limits. The upper cervical spine is normal. Sinuses/Orbits: The paranasal sinuses and mastoid air cells are clear. The globes and orbits are within normal limits. IMPRESSION: 1. Acute nonhemorrhagic 8 mm white matter infarct within the right corona and radiata, potentially involving the cortical spinal tracts. 2. No other acute intracranial abnormality or evidence for significant previous ischemia. These results were called by telephone at the time of interpretation on 03/15/2017 at 3:07 pm to [facility] , who verbally acknowledged these results. Electronically Signed   By: San Morelle M.D.   On: 03/15/2017 15:07    Procedures Procedures (including critical care time)  Medications Ordered in ED Medications - No data to display   Initial Impression / Assessment and Plan / ED Course    I have reviewed the triage vital signs and the nursing notes.  Pertinent labs & imaging results that were available during my care of the patient were reviewed by me and considered in my medical decision making (see chart for details).    NAKYIAH KUCK is a 61 y.o. female who presents to ED for acute onset of left upper and lower extremity weakness today at 8:30 am. Symptoms improved upon my evaluation. Exam with no focal neuro deficits and 5/5 strength in all four extremities. Labs reassuring. CT head negative. Neurology consulted: will obtain MRI. If negative, home with outpatient TIA work up. If abnormal, will reconsult neurology.   MRI does show acute nonhemorrhagic 8 mm white matter infarct within the right corona and radiata, potentially involving the cortical spinal tracts. No other acute abnormalities noted. Reconsulted  neurology who will evaluate patient. IM teaching service consulted who will admit.   Patient discussed with Dr. Sherry Ruffing who agrees with treatment plan.    Final Clinical Impressions(s) / ED Diagnoses   Final diagnoses:  Left-sided weakness  Cerebrovascular accident (CVA), unspecified mechanism [facility])    New Prescriptions New Prescriptions   No medications on file     Ward, Ozella Almond, PA-C 03/15/17 [place], Gwenyth Allegra, MD 03/16/17 1028

## 2017-03-16 DIAGNOSIS — E785 Hyperlipidemia, unspecified: Secondary | ICD-10-CM

## 2017-03-16 DIAGNOSIS — I639 Cerebral infarction, unspecified: Principal | ICD-10-CM

## 2017-03-16 DIAGNOSIS — I10 Essential (primary) hypertension: Secondary | ICD-10-CM

## 2017-03-16 DIAGNOSIS — G43709 Chronic migraine without aura, not intractable, without status migrainosus: Secondary | ICD-10-CM

## 2017-03-16 LAB — RAPID URINE DRUG SCREEN, HOSP PERFORMED
Amphetamines: NOT DETECTED
BARBITURATES: NOT DETECTED
BENZODIAZEPINES: NOT DETECTED
Cocaine: NOT DETECTED
Opiates: NOT DETECTED
TETRAHYDROCANNABINOL: NOT DETECTED

## 2017-03-16 LAB — LIPID PANEL
Cholesterol: 177 mg/dL (ref 0–200)
HDL: 55 mg/dL (ref >40)
LDL CALC: 105 mg/dL — AB (ref 0–99)
TRIGLYCERIDES: 84 mg/dL (ref <150)
Total CHOL/HDL Ratio: 3.2 RATIO
VLDL: 17 mg/dL (ref 0–40)

## 2017-03-16 MED ORDER — ASPIRIN EC 325 MG PO TBEC
325.0000 mg | DELAYED_RELEASE_TABLET | Freq: Every day | ORAL | Status: DC
  Administered 2017-03-16 – 2017-03-17 (×2): 325 mg via ORAL
  Filled 2017-03-16 (×2): qty 1

## 2017-03-16 MED ORDER — ATORVASTATIN CALCIUM 80 MG PO TABS
80.0000 mg | ORAL_TABLET | Freq: Every day | ORAL | Status: DC
  Administered 2017-03-16: 80 mg via ORAL
  Filled 2017-03-16: qty 1

## 2017-03-16 MED ORDER — ATORVASTATIN CALCIUM 40 MG PO TABS: 40.0000 mg | ORAL_TABLET | Freq: Every day | ORAL | Status: DC

## 2017-03-16 NOTE — Progress Notes (Addendum)
Paged by Nurse Raquel Sarna about bp being 101/42. Requested a manual bp reading which was 104/65. Patient does not have any lightheadedness or dizziness and she has been careful to ambulate around her room.   The patient's heartrate has been 51-58 during this admission. Stopped the patient's atenolol 12.5mg . The patient states that she usually takes the atenolol at nighttime prior to bed, however she took it at 9:11am this morning 7/16.  Lars Mage, MD Internal Medicine PGY1 Pager:[phone] 03/16/2017, 2:41 PM

## 2017-03-16 NOTE — Progress Notes (Signed)
   Subjective: Kelly Warner was seen laying in her bed this morning. She was pleasant and stated that she was doing well. She denied any headache, sob, chest pain, weakness, numbness or tingling. She said that the headache that she previously had has now subsided with tylenol.  Objective:  Vital signs in last 24 hours: Vitals:   03/16/17 0245 03/16/17 0435 03/16/17 0637 03/16/17 0957  BP: 110/68 124/72 126/67 123/69  Pulse: (!) 55 (!) 59 (!) 57 (!) 58  Resp: 16 16 16 18   Temp: 97.9 F (36.6 C) 97.9 F (36.6 C) 97.9 F (36.6 C) 99.3 F (37.4 C)  TempSrc: Oral Oral Oral Oral  SpO2: 95% 96% 95% 95%  Weight:      Height:       Physical Exam  Constitutional: She is oriented to person, place, and time and well-developed, well-nourished, and in no distress. No distress.  HENT:  Head: Normocephalic and atraumatic.  Cardiovascular: Normal rate, regular rhythm, normal heart sounds and intact distal pulses.   Pulmonary/Chest: Effort normal and breath sounds normal. No respiratory distress. She has no wheezes.  Abdominal: Soft. Bowel sounds are normal. She exhibits no distension. There is no tenderness.  Neurological: She is alert and oriented to person, place, and time. She displays no Babinski's sign on the right side. She displays no Babinski's sign on the left side.  Normal strength  Skin: Skin is intact.  Psychiatric: Memory, affect and judgment normal.    Assessment/Plan:  Acute Ischemic Stroke: The patient suffered an ischemic stroke as indicated by the MRI showing an acute nonhemorrhagic 54mm white matter infarct in the corona and radiata. She does not have any physical defecits are indicated by the physical exam.  -CT angio of the head and neck did not show any stenosis at the aortic arch or in the neck, there was normal variant cta circle of willis without sig prox stenosis, aneurysm, or branch vessel occlusion.  -Lipid panel was normal with tchol=177, trig=84, hdl=55,  vldl=17 -HbA1c pending -echo pending -stop zolmitriptan due to contraindication in ischemic strokes -PT/OT follow-up OT said that pt was able to independently ambulate and found that pt only felt residual numbness around the left knee. She does not need f/u and needs intermittent supervision. -Lovenox 40mg   -atorvastatin 80mg , aspirin 325mg   It is still uncertain what precipitated the patient's stroke, but migraine is a possible cause due to the vasospastic effects causing changes in blood flow, increased platelet aggregability, and endothelial abnormalities.   Essential HTN: Blood pressure has ranged 117-140/73-76 over the past 24 hrs. Contineue atenolol 12.5mg   GERD: Continue with home pantoprazole  Migraine headache Alleviated with tylenol -D/c zomig due to contraindication in strokes  Dispo: Anticipated discharge today.   Lars Mage, MD Internal Medicine PGY1 [phone] 03/16/2017, 1:23 PM

## 2017-03-16 NOTE — Progress Notes (Signed)
Occupational Therapy Evaluation Patient Details Name: Kelly Warner MRN: [id number] DOB: [birth year] Today's Date: 03/16/2017    History of Present Illness 61 yo with acute onset of L U/LE weakness. See chart for PMH. 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 Pt appears to be functioning close to baseline level. Pt states she only feels residual "numbness" around L knee. Educated pt/family on warning signs/symptoms of CVA using [employer]. Pt verbalized understanding. Pt safe to DC home from OT viewpoint when medically stable. OT signing off.     Follow Up Recommendations  No OT follow up;Supervision - Intermittent    Equipment Recommendations  None recommended by OT    Recommendations for Other Services       Precautions / Restrictions Precautions Precautions: None      Mobility Bed Mobility Overal bed mobility: Independent                Transfers Overall transfer level: Independent                    Balance Overall balance assessment: Independent                               Standardized Balance Assessment Standardized Balance Assessment : Dynamic Gait Index   Dynamic Gait Index Level Surface: Normal Change in Gait Speed: Normal Gait with Horizontal Head Turns: Normal Gait with Vertical Head Turns: Normal Gait and Pivot Turn: Normal Step Over Obstacle: Normal Step Around Obstacles: Normal Steps: Normal Total Score: 24     ADL either performed or assessed with clinical judgement   ADL Overall ADL's : At baseline                                             Vision Baseline Vision/History: Wears glasses Wears Glasses: At all times Vision Assessment?: No apparent visual deficits Additional Comments: Pt reports visual changes @ 3 weeks ago that she attributed to migraine headache     Perception      Praxis      Pertinent Vitals/Pain Pain Assessment: 0-10 Pain Score: 3  Pain Location: head Pain Descriptors / Indicators: Aching Pain Intervention(s): Limited activity within patient's tolerance     Hand Dominance Left   Extremity/Trunk Assessment Upper Extremity Assessment Upper Extremity Assessment: Overall WFL for tasks assessed   Lower Extremity Assessment Lower Extremity Assessment: Defer to PT evaluation   Cervical / Trunk Assessment Cervical / Trunk Assessment: Normal   Communication Communication Communication: No difficulties   Cognition Arousal/Alertness: Awake/alert Behavior During Therapy: WFL for tasks assessed/performed Overall Cognitive Status: Within Functional Limits for tasks assessed                                     General Comments       Exercises     Shoulder Instructions      Home Living Family/patient expects to be discharged to:: Private residence Living Arrangements: Spouse/significant other;Children Available Help at Discharge: Family;Available 24 hours/day Type of Home: House Home Access: Stairs to enter [employer] of Steps: 5 Entrance Stairs-Rails: Right Home Layout: Two level;Able to  live on main level with bedroom/bathroom     Bathroom Shower/Tub: Occupational psychologist: Standard Bathroom Accessibility: Yes How Accessible: Accessible via walker Home Equipment: None          Prior Functioning/Environment Level of Independence: Independent        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 [employer]        OT Problem List: Impaired sensation;Decreased knowledge of precautions      OT Treatment/Interventions:      OT Goals(Current goals can be found in the care plan section) Acute Rehab OT Goals Patient Stated Goal: to find out why this happened OT Goal Formulation: All assessment and education complete, DC therapy  OT Frequency:     Barriers to D/C:             Co-evaluation              AM-PAC PT "6 Clicks" Daily Activity     Outcome Measure Help from another person eating meals?: None Help from another person taking care of personal grooming?: None Help from another person toileting, which includes using toliet, bedpan, or urinal?: None Help from another person bathing (including washing, rinsing, drying)?: None Help from another person to put on and taking off regular upper body clothing?: None Help from another person to put on and taking off regular lower body clothing?: None 6 Click Score: 24   End of Session Nurse Communication: Mobility status  Activity Tolerance: Patient tolerated treatment well Patient left: in bed;with call bell/phone within reach;with family/visitor present  OT Visit Diagnosis: Unsteadiness on feet (R26.81)                Time: 6546-5035 OT Time Calculation (min): 19 min Charges:  OT General Charges $OT Visit: 1 Procedure OT Evaluation $OT Eval Low Complexity: 1 Procedure G-Codes:     Brandom Kerwin, OT/L  [phone] 03/16/2017  Edmund Holcomb,HILLARY 03/16/2017, 12:23 PM

## 2017-03-16 NOTE — Evaluation (Signed)
Physical Therapy Evaluation Patient Details Name: Kelly Warner MRN: [id number] DOB: 10/28/1955 Today's Date: 03/16/2017   History of Present Illness  61 yo female admitted on 03/15/17 with acute onset of L U/LE weakness. MRI + acute R corona radiata infarct.  Pt with significant PMH of HTN, migraines, R shoulder surgery.   Clinical Impression  Pt with very mild residual deficits affecting her high level balance.  BEFAST education reinforced as she could not report one of the letters without assist.  Encouraged to slow down and take her time until she feels that all of her symptoms have resolved.  Pt is too high functioning and will likely make a full recovery.  At this time PT will sign off and does not recommend f/u therapy.     Follow Up Recommendations No PT follow up    Equipment Recommendations  None recommended by PT    Recommendations for Other Services   NA    Precautions / Restrictions Precautions Precautions: None      Mobility  Bed Mobility Overal bed mobility: Independent                Transfers Overall transfer level: Independent                  Ambulation/Gait Ambulation/Gait assistance: Supervision Ambulation Distance (Feet): 200 Feet Assistive device: None Gait Pattern/deviations: WFL(Within Functional Limits)   Gait velocity interpretation: at or above normal speed for age/gender General Gait Details: Pt was able to preform level surface gait at supervision level.  Only LOB observed was with turns bil stepping reaction elicited, but no outside physical help to recover balance.   Stairs Stairs: Yes Stairs assistance: Modified independent (Device/Increase time) Stair Management: One rail Left;Alternating pattern;Forwards Number of Stairs: 10 General stair comments: Pt able to do stairs safely and easily with rail for support/balance.       Modified Rankin (Stroke Patients Only) Modified Rankin (Stroke Patients Only) Pre-Morbid  Rankin Score: No symptoms Modified Rankin: No significant disability     Balance Overall balance assessment: Needs assistance Sitting-balance support: No upper extremity supported;Feet supported Sitting balance-Leahy Scale: Normal     Standing balance support: No upper extremity supported Standing balance-Leahy Scale: Good               High level balance activites: Side stepping;Backward walking;Direction changes;Turns;Sudden stops High Level Balance Comments: high level balance activities required supervision for safety and LOB x 2             Pertinent Vitals/Pain Pain Assessment: 0-10 Pain Score: 5  Pain Location: head Pain Descriptors / Indicators: Aching Pain Intervention(s): Limited activity within patient's tolerance;Monitored during session;Repositioned    Home Living Family/patient expects to be discharged to:: Private residence Living Arrangements: Spouse/significant other;Children Available Help at Discharge: Family;Available 24 hours/day Type of Home: House Home Access: Stairs to enter Entrance Stairs-Rails: Right Entrance Stairs-Number of Steps: 5 Home Layout: Two level;Able to live on main level with bedroom/bathroom Home Equipment: None      Prior Function Level of Independence: Independent         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  LLE Sensation:  (intact to LT) LLE Coordination: decreased fine motor (pt reports the leg feels "slow")    Cervical / Trunk Assessment Cervical / Trunk Assessment: Normal  Communication   Communication: No  difficulties  Cognition Arousal/Alertness: Awake/alert Behavior During Therapy: WFL for tasks assessed/performed Overall Cognitive Status: Impaired/Different from baseline Area of Impairment: Memory                     Memory: Decreased recall of precautions;Decreased short-term memory         General Comments: PT had significant difficulty recalling any of the BEFAST acronyms.        General Comments General comments (skin integrity, edema, etc.): Reviewed BEFAST with pt and her family.  Daughter wrote it down and will quiz pt re: what it means.         Assessment/Plan    PT Assessment Patent does not need any further PT services         PT Goals (Current goals can be found in the Care Plan section)  Acute Rehab PT Goals Patient Stated Goal: to find out why this happened PT Goal Formulation: All assessment and education complete, DC therapy               AM-PAC PT "6 Clicks" Daily Activity  Outcome Measure Difficulty turning over in bed (including adjusting bedclothes, sheets and blankets)?: None Difficulty moving from lying on back to sitting on the side of the bed? : None Difficulty sitting down on and standing up from a chair with arms (e.g., wheelchair, bedside commode, etc,.)?: None Help needed moving to and from a bed to chair (including a wheelchair)?: None Help needed walking in hospital room?: None Help needed climbing 3-5 steps with a railing? : None 6 Click Score: 24    End of Session   Activity Tolerance: Patient tolerated treatment well Patient left: in chair;with family/visitor present   PT Visit Diagnosis: Hemiplegia and hemiparesis Hemiplegia - Right/Left: Left Hemiplegia - dominant/non-dominant: Dominant Hemiplegia - caused by: Cerebral infarction    Time: 1719-1735 PT Time Calculation (min) (ACUTE ONLY): 16 min   Charges:      Wells Guiles B. Amberlie Gaillard, PT, DPT [phone]    PT Evaluation $PT Eval Low Complexity: 1 Procedure      03/16/2017, 11:06 PM

## 2017-03-16 NOTE — Progress Notes (Signed)
NT reported to nurse pt's blood pressure showing 101/42. RN informed MD. MD asking to get manual B/P on patient.

## 2017-03-16 NOTE — Progress Notes (Signed)
STROKE TEAM PROGRESS NOTE   HISTORY OF PRESENT ILLNESS (per record) HPI: Kelly Warner is a 61 y.o. female  with a history of mild hypertension and migraine headaches. Last week the patient had neck and occipital pain which was different from her usual migraine headaches. She did take migraine medication with no relief. She finally put an ice pack to the area. She had intermittent relief.  On the morning of 03/15/2017 she awoke at approximately 4 or 5 AM again with neck and occipital pain. She applied an ice pack and fell back to sleep. She later got up at around 8 AM and with her husband and granddaughter went for a short walk outside. She noticed that her left arm and left leg felt heavy. She began to drag her left foot. They went back to the house. She took an aspirin 325 mgs and went to lie on the couch. She was still having symptoms after she had rested. She decided to come to the emergency room for further evaluation.  Left arm symptoms had resolved by the time the patient was examined by the Neurohospitalist.  The patient continues to endorse left-leg heaviness in her left leg. She is not on antiplatelet therapy on a regular basis. She takes aspirin as needed for mild discomfort. An MRI revealed an acute nonhemorrhagic 8 mm white matter infarct within the right corona and radiata, potentially involving the cortical spinal tracts.   Date last known well: Date: 03/15/2017 Time last known well: Time: 05:00   Patient was not administered IV t-PA secondary to minimal deficits on arrival. She was admitted to General Neurology for further evaluation and treatment.   SUBJECTIVE (INTERVAL HISTORY) Her husband and daughter are at the bedside.  The patient is awake, alert, and answers all questions appropriately.  On exam she continues to endorse slight weakness around her right vastus lateralis, but has a normal gait.  Of note, the patient has a history of migraines, and has been taking zolmitriptan.  Admitted that occasionally she will have heart fluttering lasting just seconds but not often.    OBJECTIVE Temp:  [97.9 F (36.6 C)-99.3 F (37.4 C)] 99.3 F (37.4 C) (07/16 0957) Pulse Rate:  [54-59] 58 (07/16 0957) Cardiac Rhythm: Heart block (07/16 0700) Resp:  [12-18] 18 (07/16 0957) BP: (107-145)/(58-78) 123/69 (07/16 0957) SpO2:  [91 %-97 %] 95 % (07/16 0957) Weight:  [84.7 kg (186 lb 11.2 oz)] 84.7 kg (186 lb 11.2 oz) (07/15 1830)  CBC:  Recent Labs Lab 03/15/17 1029 03/15/17 1045  WBC 6.0  --   NEUTROABS 3.9  --   HGB 14.5 15.0  HCT 43.9 44.0  MCV 88.2  --   PLT 288  --     Basic Metabolic Panel:  Recent Labs Lab 03/15/17 1029 03/15/17 1045  NA 138 140  K 3.9 3.9  CL 105 103  CO2 25  --   GLUCOSE 116* 114*  BUN 14 16  CREATININE 0.82 0.70  CALCIUM 9.1  --     Lipid Panel:    Component Value Date/Time   CHOL 177 03/16/2017 0307   TRIG 84 03/16/2017 0307   HDL 55 03/16/2017 0307   CHOLHDL 3.2 03/16/2017 0307   VLDL 17 03/16/2017 0307   LDLCALC 105 (H) 03/16/2017 0307   HgbA1c: No results found for: HGBA1C Urine Drug Screen:    Component Value Date/Time   LABOPIA NONE DETECTED 03/16/2017 0800   COCAINSCRNUR NONE DETECTED 03/16/2017 0800   LABBENZ NONE  DETECTED 03/16/2017 0800   AMPHETMU NONE DETECTED 03/16/2017 0800   THCU NONE DETECTED 03/16/2017 0800   LABBARB NONE DETECTED 03/16/2017 0800    Alcohol Level No results found for: [place] I have personally reviewed the radiological images below and agree with the radiology interpretations.  Ct Angio Head and neck W and Wo Contrast 03/15/2017 IMPRESSION: 1. No significant stenosis at the aortic arch or in the neck. 2. Normal variant CTA circle of Willis without significant proximal stenosis, aneurysm, or branch vessel occlusion. 3. Cervical spondylosis is most pronounced at C4-5 and C5-6.   Ct Head Wo Contrast 03/15/2017 IMPRESSION: No significant abnormality.  Mr Brain Wo  Contrast 03/15/2017 IMPRESSION: 1. Acute nonhemorrhagic 8 mm white matter infarct within the right corona and radiata, potentially involving the cortical spinal tracts. 2. No other acute intracranial abnormality or evidence for significant previous ischemia.   TTE pending   PHYSICAL EXAM  Temp:  [97.9 F (36.6 C)-99.3 F (37.4 C)] 97.9 F (36.6 C) (07/16 1426) Pulse Rate:  [51-59] 55 (07/16 1447) Resp:  [12-18] 17 (07/16 1447) BP: (101-145)/(42-78) 104/65 (07/16 1447) SpO2:  [91 %-97 %] 97 % (07/16 1426) Weight:  [186 lb 11.2 oz (84.7 kg)] 186 lb 11.2 oz (84.7 kg) (07/15 1830)  General - Well nourished, well developed, in no apparent distress.  Ophthalmologic - Sharp disc margins OU.   Cardiovascular - Regular rate and rhythm.  Mental Status -  Level of arousal and orientation to time, place, and person were intact. Language including expression, naming, repetition, comprehension was assessed and found intact. Attention span and concentration were normal. Fund of Knowledge was assessed and was intact.  Cranial Nerves II - XII - II - Visual field intact OU. III, IV, VI - Extraocular movements intact. V - Facial sensation intact bilaterally. VII - Facial movement intact bilaterally. VIII - Hearing & vestibular intact bilaterally. X - Palate elevates symmetrically. XI - Chin turning & shoulder shrug intact bilaterally. XII - Tongue protrusion intact.  Motor Strength - The patient's strength was normal in all extremities and pronator drift was absent.  Bulk was normal and fasciculations were absent.   Motor Tone - Muscle tone was assessed at the neck and appendages and was normal.  Reflexes - The patient's reflexes were 1+ in all extremities and she had no pathological reflexes.  Sensory - Light touch, temperature/pinprick, vibration and proprioception, and Romberg testing were assessed and were symmetrical.    Coordination - The patient had normal movements in the hands  and feet with no ataxia or dysmetria.  Tremor was absent.  Gait and Station - The patient's transfers, posture, gait, station, and turns were observed as normal.   ASSESSMENT/PLAN Ms. Kelly Warner is a 61 y.o. female with history of mild hypertension and migraine headaches presenting with occipital and neck pain, and left and and leg heaviness. She did not receive IV t-PA due to low NIHSS/minimal deficits on arrival.   Stroke: acute right CR/SO small infarct, likely secondary to small vessel disease. However, patient lack of traditional stroke risk factors, we will recommended outpatient 30 day cardiac event monitoring.  Resultant  subjective left leg discomfort on walking  CT head: no stroke  MRI head: acute right corona radiata infarct, potentially involving the cortical spinal tracts  CTA head: Unremarkable, No LVO  2D Echo: pending  Recommend 30 day cardiac event monitoring as outpatient to rule out A. fib  LDL 105  HgbA1c pending  Lovenox 40 mg  sq daily for VTE prophylaxis  Diet Heart Room service appropriate? Yes; Fluid consistency: Thin  aspirin 81 mg daily (taking intermittently) prior to admission, now on aspirin 325 mg daily. Continue aspirin on discharge  Patient counseled to be compliant with her antithrombotic medications  Ongoing aggressive stroke risk factor management  Therapy recommendations:  None  Disposition:  Likely home  Hypertension  Stable  On home atenolol 12.5mg  PO daily  Permissive hypertension (OK if < 220/120) but gradually normalize in 5-7 days  Long-term BP goal normotensive  Hyperlipidemia  Home meds: none  LDL 105, goal < 70  Add atorvastatin 40mg  PO daily  Continue statin at discharge  Other Stroke Risk Factors  ETOH use, advised to drink no more than drink(s) a day  Obesity, Body mass index is 32.05 kg/m., recommend weight loss, diet and exercise as appropriate   Migraines - every 3 months in the past, however  for the last week it was constant  Other Active Problems  Infrequent heart fluttering lasting just seconds   Hospital day # 1  Neurology will sign off. Please call with questions. Pt will follow up with Cecille Rubin, NP, at [facility] in about 6 weeks. Thanks for the consult.  Rosalin Hawking, MD PhD Stroke Neurology 03/16/2017 5:29 PM   To contact Stroke Continuity provider, please refer to [email or web address] After hours, contact General Neurology

## 2017-03-17 ENCOUNTER — Inpatient Hospital Stay (HOSPITAL_COMMUNITY): Payer: PRIVATE HEALTH INSURANCE

## 2017-03-17 DIAGNOSIS — I639 Cerebral infarction, unspecified: Secondary | ICD-10-CM

## 2017-03-17 LAB — ECHOCARDIOGRAM COMPLETE
Height: 64 in
Weight: 2987.2 oz

## 2017-03-17 LAB — HIV ANTIBODY (ROUTINE TESTING W REFLEX): HIV Screen 4th Generation wRfx: NONREACTIVE

## 2017-03-17 MED ORDER — ATORVASTATIN CALCIUM 80 MG PO TABS: 80.0000 mg | ORAL_TABLET | Freq: Every day | ORAL | 0 refills | Status: DC

## 2017-03-17 MED ORDER — ASPIRIN EC 81 MG PO TBEC: 81.0000 mg | DELAYED_RELEASE_TABLET | Freq: Every day | ORAL | 0 refills | Status: AC

## 2017-03-17 NOTE — Progress Notes (Signed)
  Echocardiogram 2D Echocardiogram has been performed.  Kelly Warner 03/17/2017, 10:25 AM

## 2017-03-17 NOTE — Progress Notes (Signed)
   Subjective: Kelly Warner was seen sitting on the side of her bed this morning. She was pleasant and doing well. She denied any lightheadedness, dizziness, sob, or chest pain. She said that she was able to ambulate well and only has some mild left sided weakness at her knee. She was educated about the medication she will be discharged on. She expressed concern whether she will have any GI bleeds if she has to use aspirin long term. We suggested she use enteric coated aspirin.   Objective:  Vital signs in last 24 hours: Vitals:   03/16/17 2100 03/17/17 0054 03/17/17 0438 03/17/17 1044  BP: 123/66 123/60 112/62 135/65  Pulse: (!) 59 60 (!) 58 (!) 57  Resp: 18 18 16 18   Temp:  98.4 F (36.9 C) 98.5 F (36.9 C) 97.7 F (36.5 C)  TempSrc: Oral Oral Oral Oral  SpO2: 98% 95% 92% 98%  Weight:      Height:       Physical Exam  Constitutional: She is well-developed, well-nourished, and in no distress. No distress.  HENT:  Head: Normocephalic and atraumatic.  Cardiovascular: Normal rate, regular rhythm, normal heart sounds and intact distal pulses.   Pulmonary/Chest: Effort normal and breath sounds normal. No respiratory distress. She has no wheezes.  Musculoskeletal: Normal range of motion.  Neurological:  Normal 5/5 strength bilaterally  Skin: No rash noted. No erythema.    Assessment/Plan: Acute Ischemic Stroke: -Acute 62mm ischemic stroke noted in the corona and radiata per mri.  -Mild residual left knee weakness  -OT recommended intermittent supervision w/o any need for follow up -HbA1c pending -echo in process today 7/17 -stop zolmitriptan due to contraindication in ischemic strokes -Lovenox 40mg   -atorvastatin 80mg , aspirin 325mg  -Holter monitor outpatient   Cause of stroke uncertain. Possible due to paroxysmal afib that will be followed with holter monitor. May also be due to migraine history.  Essential HTN: Blood pressure has ranged 101-123/42-69.  -Discontinued  atenolol. P over the past 24 hrs. Contineue atenolol 12.5mg  -Pt does not have any complaints of nausea, lightheadedness  GERD: Continue with home pantoprazole  Migraine headache -Acetaminophen 650mg  q4hrs prn -D/c zomig due to contraindication in strokes   Dispo: Anticipated discharge today.   Lars Mage, MD Internal Medicine PGY1 Pager:[phone] 03/17/2017, 10:57 AM

## 2017-03-17 NOTE — Progress Notes (Signed)
Discharge instructions reviewed with patient/family. All questions answered at this time. Transport home by family.   Brigida Scotti, RN 

## 2017-03-17 NOTE — Discharge Summary (Signed)
Name: Kelly Warner MRN: [id number] DOB: 1956/08/17 61 y.o. PCP: System, Pcp Not In  Date of Admission: 03/15/2017 10:42 AM Date of Discharge: 03/17/2017 Attending Physician: Lucious Groves, DO  Discharge Diagnosis:  Principal Problem:   Acute ischemic stroke [facility]) Active Problems:   Essential hypertension   Hyperlipidemia   Laryngopharyngeal reflux (LPR)   Migraine headache   Discharge Medications: Allergies as of 03/17/2017   No Known Allergies     Medication List    STOP taking these medications   atenolol 25 MG tablet Commonly known as:  TENORMIN   zolmitriptan 5 MG tablet Commonly known as:  ZOMIG     TAKE these medications   aspirin EC 81 MG tablet Take 1 tablet (81 mg total) by mouth daily. What changed:  medication strength  how much to take  when to take this  reasons to take this   atorvastatin 80 MG tablet Commonly known as:  LIPITOR Take 1 tablet (80 mg total) by mouth daily at 6 PM.   citalopram 20 MG tablet Commonly known as:  CELEXA Take 20 mg by mouth daily.   cyclobenzaprine 5 MG tablet Commonly known as:  FLEXERIL Take 5 mg by mouth 3 (three) times daily as needed for muscle spasms.   loratadine 10 MG tablet Commonly known as:  CLARITIN Take 10 mg by mouth daily as needed for allergies.   mometasone 50 MCG/ACT nasal spray Commonly known as:  NASONEX Place 2 sprays into the nose daily as needed (congestion).   naproxen sodium 220 MG tablet Commonly known as:  ANAPROX Take 220 mg by mouth daily as needed (pain).   pantoprazole 40 MG tablet Commonly known as:  PROTONIX Take 40 mg by mouth daily.       Disposition and follow-up:   Kelly Warner was discharged from [facility] in good condition.  At the hospital follow up visit please address:  1.  Holter monitor, neurology appointment   2.  Labs / imaging needed at time of follow-up: none  3.  Pending labs/ test needing follow-up:  hba1c=5.8  Follow-up Appointments: Follow-up Information    Dennie Bible, NP. Schedule an appointment as soon as possible for a visit in 6 week(s).   Specialty:  Family Medicine Contact information: [place] [address] [address] [place] Hospital Course by problem list:  Acute ischemic stroke [facility]) The patient presented with sudden onset left sided weakness in her upper and lower extremity. MRI of the head found an acute nonhemorrhagic 8 mm white matter infarct within the right corona and radiata. There was no significant deficits noted on neurology exam except for mild left knee weakness. PT and OT referral was made and OT stated that she was able to do all activities well and signed off.   We examined reasons why she may have had the stroke. Her hba1c=5.8 and lipid panel was normal (tchole=177, trigly=84, hdl=55, ldl=105). There was no indication of atrial fibrillation on ekg or on telemetry. CT angio of the head and neck did not show any sign of stenosis in the aortic arch or in the neck and there was no aneurysm, stenosis, or branch vessel occlusion. Cardiac echo showed lv ef of 50-55% and normal structure of the heart without any remarkable abnormalities. It is possible that her history of migraines may have contributed to the stroke. Migraines cause changes in the cerebral  blood flow, increased platelet aggregability, and endothelial abnormalities can lead to a stroke.   The patient's Zolmitriptan was stopped due to its contraindication in ischemic strokes. Neuro checks were done every 2 hours for the first 12 hours. Lovenox 40mg  was given. The patient was stable at discharge and did not have any significant neurological deficits. She was instructed to wear a holter monitor outpatient to note presence of any paroxysmal atrial fibrillation. She was instructed to follow up with neurology in 2 weeks and was discharged on atorvastatin and aspirin.    Essential hypertension The patient's htn was managed with her home atenolol 12.5mg . However, her blood pressure dropped during the admission ranging 101-123/42-69 and hence we discontinued the atenolol. The patient did not have any symptoms of nausea or lightheadedness.    Hyperlipidemia Managed with atorvastatin  Laryngopharyngeal reflux (LPR) Managed with home pantoprazole.  Migraine headache The patient has a history of migraines for which she used zolmitriptan at home. However, zolmitriptan is contraindicated in ischemic strokes and therefore was discontinued.   Discharge Vitals:   BP (!) 147/65 (BP Location: Right Arm)   Pulse 65   Temp 97.7 F (36.5 C) (Oral)   Resp 18   Ht 5\' 4"  (1.626 m)   Wt 186 lb 11.2 oz (84.7 kg)   SpO2 99%   BMI 32.05 kg/m   Pertinent Labs, Studies, and Procedures:   MR Brain (7/15) 1. Acute nonhemorrhagic 8 mm white matter infarct within the right corona and radiata, potentially involving the cortical spinal tracts. 2. No other acute intracranial abnormality or evidence for significant previous ischemia.  CT head (7/15) No significant abnormality  CT Angio Head and neck (7/15) 1. No significant stenosis at the aortic arch or in the neck. 2. Normal variant CTA circle of Willis without significant proximal stenosis, aneurysm, or branch vessel occlusion. 3. Cervical spondylosis is most pronounced at C4-5 and C5-6.  Discharge Instructions: Discharge Instructions    Ambulatory referral to Cardiology    Complete by:  As directed    30 day event monitor for cryptogenic stroke   Ambulatory referral to Neurology    Complete by:  As directed    Follow up with stroke clinic Cecille Rubin preferred, if not available, then consider Caesar Chestnut, [facility] or Jaynee Eagles whoever is available) at [facility] in about 6-8 weeks. Thanks.   Diet - low sodium heart healthy    Complete by:  As directed    Discharge instructions    Complete by:  As directed    You  were admitted to the hospital for a stroke. Fortunately your symptoms are mostly improved and we expect this to remain so. The most important thing is to reduce the risk of a repeat stroke.  You will need to start taking aspirin 81mg  once daily and atorvastatin (Lipitor) 80mg  once daily. I also recommend  avoiding any migraine medicine such as triptans without discussing them with your doctor or a neurologist first.  We will refer you to the cardiology clinic about a 30 day event monitor to look for possible arrhythmias that are coming and going.  You should plan to be seen at your primary care doctor's office within the next month.   Increase activity slowly    Complete by:  As directed       Signed: Lars Mage, MD Internal Medicine PGY1 Pager:[phone] 03/18/2017, 9:45 PM

## 2017-03-18 ENCOUNTER — Other Ambulatory Visit: Payer: Self-pay | Admitting: *Deleted

## 2017-03-18 DIAGNOSIS — Z8673 Personal history of transient ischemic attack (TIA), and cerebral infarction without residual deficits: Secondary | ICD-10-CM

## 2017-03-18 DIAGNOSIS — I499 Cardiac arrhythmia, unspecified: Secondary | ICD-10-CM

## 2017-03-18 DIAGNOSIS — I10 Essential (primary) hypertension: Secondary | ICD-10-CM

## 2017-03-18 LAB — HEMOGLOBIN A1C
HEMOGLOBIN A1C: 5.8 % — AB (ref 4.8–5.6)
Mean Plasma Glucose: 120 mg/dL

## 2017-03-24 ENCOUNTER — Institutional Professional Consult (permissible substitution): Payer: PRIVATE HEALTH INSURANCE | Admitting: Cardiology

## 2017-03-30 ENCOUNTER — Ambulatory Visit (INDEPENDENT_AMBULATORY_CARE_PROVIDER_SITE_OTHER): Payer: PRIVATE HEALTH INSURANCE

## 2017-03-30 ENCOUNTER — Other Ambulatory Visit: Payer: Self-pay | Admitting: Cardiovascular Disease

## 2017-03-30 ENCOUNTER — Other Ambulatory Visit: Payer: Self-pay | Admitting: Neurology

## 2017-03-30 DIAGNOSIS — I10 Essential (primary) hypertension: Secondary | ICD-10-CM

## 2017-03-30 DIAGNOSIS — Z8673 Personal history of transient ischemic attack (TIA), and cerebral infarction without residual deficits: Secondary | ICD-10-CM

## 2017-03-30 DIAGNOSIS — R002 Palpitations: Secondary | ICD-10-CM | POA: Diagnosis not present

## 2017-03-30 DIAGNOSIS — I499 Cardiac arrhythmia, unspecified: Secondary | ICD-10-CM

## 2017-04-13 ENCOUNTER — Other Ambulatory Visit: Payer: Self-pay | Admitting: Internal Medicine

## 2017-04-29 ENCOUNTER — Telehealth: Payer: Self-pay | Admitting: Neurology

## 2017-04-29 NOTE — Telephone Encounter (Signed)
RN call patient back about her insurance denying her 30 day cardiac monitor. PT stated they need a letter for medically necessary for the cardiac monitor. Rn stated Dr. Erlinda Hong is in the hospital this week,he will return to office on Tuesday because Monday is a holiday.RN ask if the letter can wait until he is back in the office. Pt stated it can wait. She gave claim number of [id number],HWY her insurance ID of [id number]. Rn recommend pt fax a copy of the denial letter to our office at [phone].Pt stated she will fax it from work.

## 2017-04-29 NOTE — Telephone Encounter (Signed)
Pt saw Dr Erlinda Hong on 7-16 when she was in the hospital and he wanted a cardiac heart monitor ordered for her to wear for 30 days.  Pt states her insurance denied it due to not having reason for it to be medically necessary.  Pt is asking for a note from Dr Erlinda Hong stating why it would be considered medically necessary for her to have, please call

## 2017-05-05 NOTE — Telephone Encounter (Signed)
Please let the patient know that the 30 day cardiac event monitoring test done recently was negative for irregular heart beat. Please continue current treatment. We are looking at her denial letter and will do appeal for her. Thanks.  Rosalin Hawking, MD PhD Stroke Neurology 05/05/2017 5:20 PM

## 2017-05-05 NOTE — Telephone Encounter (Signed)
Yes, let's see the denial letter and make appeal. Thanks.   Rosalin Hawking, MD PhD Stroke Neurology 05/05/2017 5:07 PM

## 2017-05-06 ENCOUNTER — Encounter: Payer: Self-pay | Admitting: Neurology

## 2017-05-06 NOTE — Telephone Encounter (Signed)
Rn call patient and left vm that her 30 day cardiac monitor was negative. Rn also left message that Dr.Xu is working on her appeal letter for insurance of why they denied coverage of the heart monitor.

## 2017-05-07 NOTE — Telephone Encounter (Signed)
Rn call patients insurance company at [phone]. Rn spoke with Oley Balm about the cardiac event monitor being denied by pts insurance. Oley Balm stated office notes, and claim letter from Dr. Erlinda Hong can be fax to [phone]. Information fax and receive.

## 2017-05-18 ENCOUNTER — Ambulatory Visit: Payer: Self-pay | Admitting: Nurse Practitioner

## 2017-05-18 NOTE — Progress Notes (Signed)
[facility]  PATIENT: JOSEPHINA MELCHER DOB: 05-19-1956   REASON FOR VISIT:   Hospital Follow up for stroke right corona and radiata HISTORY FROM:patient    HISTORY OF PRESENT ILLNESS:Per RecordCharlene M Robsonis a 61 y.o.femalewith a history of mild hypertension and migraine headaches.Last week the patient had neck and occipital pain which was different from her usual migraine headaches. She did take migraine medication with no relief. She finally put an ice pack to the area. She had intermittent relief.  On the morning of 03/15/2017 she awoke at approximately 4 or5 AM again with neck and occipital pain. She applied an ice pack and fell back to sleep. She later got up at around 8 AM and with her husband and granddaughter went for a short walk outside. She noticed that her left arm and left leg felt heavy. She began to drag her left foot. They went back to the house. She took an aspirin 325 mgs and went to lie on the couch. She was still having symptoms after she had rested. She decided to come to the emergency room for further evaluation.  Left arm symptoms had resolved by the time the patient was examined by the Neurohospitalist.  The patient continues to endorse left-leg heaviness in her left leg. She is not on antiplatelet therapy on a regular basis. She takes aspirin as needed for mild discomfort. An MRIrevealed an acute nonhemorrhagic 8 mm white matter infarct within the right corona and radiata, potentially involving the cortical spinal tracts.  Date last known well: Date: 03/15/2017 Time last known well: Time: 05:00   Patient was not administered IV t-PA secondary to minimal deficits on arrival. She was admitted to General Neurology for further evaluation and treatment. SUBJECTIVE (INTERVAL HISTORY)Dr. Erlinda Hong Her husband and daughter are at the bedside.  The patient is awake, alert, and answers all questions appropriately.  On exam she continues to endorse slight  weakness around her right vastus lateralis, but has a normal gait.  Of note, the patient has a history of migraines, and has been taking zolmitriptan. Admitted that occasionally she will have heart fluttering lasting just seconds but not often.   Interval hx  09/18/2018CMMs. Coote,  61 year old female returns  for hospital follow-up after stroke event in July 2018.  CTA of the head unremarkable no large vessel occlusion LDL 105. 2-D Echo 50-55%. Hemoglobin A1c 5.8.  She is currently on aspirin 0.81 daily without recurrent stroke or TIA symptoms. She has no bruising and no bleeding.  She is on Lipitor 40 mg daily without complaints of myalgias. She is due to get labs next month at primary care.  30 day cardiac event without atrial fib , no PVCs or PACs. Palpitations correlated with normal sinus rhythm. She execises by walking.  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  She returns for reevaluaon  REVIEW OF SYSTEMS: Full 14 system review of systems performed and notable only for those listed, all others are neg:  Constitutional: neg  Cardiovascular: neg Ear/Nose/Throat: neg  Skin: neg Eyes:  Light sensitivity Respiratory: neg Gastroitestinal: neg  Hematology/Lymphatic: neg  Endocrine: neg Musculoskeletal:neg Allergy/Immunology: neg Neurological:  Occasional headache Psychiatric:  anxiety Sleep : neg   ALLERGIES: No Known Allergies  HOME MEDICATIONS: Outpatient Medications Prior to Visit  Medication Sig Dispense Refill  . aspirin EC 81 MG tablet Take 1 tablet (81 mg total) by mouth daily. 30 tablet 0  . citalopram (CELEXA) 20 MG tablet Take 20 mg by mouth  daily.    . cyclobenzaprine (FLEXERIL) 5 MG tablet Take 5 mg by mouth 3 (three) times daily as needed for muscle spasms.   0  . loratadine (CLARITIN) 10 MG tablet Take 10 mg by mouth daily as needed for allergies.    . mometasone (NASONEX) 50 MCG/ACT nasal spray Place 2 sprays into the nose daily as needed (congestion).    .  naproxen sodium (ANAPROX) 220 MG tablet Take 220 mg by mouth daily as needed (pain).    . pantoprazole (PROTONIX) 40 MG tablet Take 40 mg by mouth daily.  3  . atorvastatin (LIPITOR) 80 MG tablet Take 1 tablet (80 mg total) by mouth daily at 6 PM. 30 tablet 0   No facility-administered medications prior to visit.     PAST MEDICAL HISTORY: Past Medical History:  Diagnosis Date  . GERD (gastroesophageal reflux disease)   . Hypertension   . Migraines     PAST SURGICAL HISTORY: Past Surgical History:  Procedure Laterality Date  . ABDOMINAL HYSTERECTOMY    . NASAL FRACTURE SURGERY    . SHOULDER ARTHROSCOPY Right   . TONSILLECTOMY      FAMILY HISTORY: Family History  Problem Relation Age of Onset  . Atrial fibrillation Mother   . Hypertension Mother   . Heart attack Father   . Atrial fibrillation Brother   . Hypertension Brother     SOCIAL HISTORY: Social History   Social History  . Marital status: Married    Spouse name: Shanon Brow  . Number of children: 3  . Years of education: 4   Occupational History  .      RN medical records   Social History Main Topics  . Smoking status: Never Smoker  . Smokeless tobacco: Never Used  . Alcohol use Yes     Comment: occ  . Drug use: No  . Sexual activity: Not on file   Other Topics Concern  . Not on file   Social History Narrative   Lives with husband   Caffeine - none        PHYSICAL EXAM  Vitals:   05/19/17 1431  BP: 110/63  Pulse: (!) 59  Weight: 187 lb 3.2 oz (84.9 kg)  Height: 5\' 4"  (1.626 m)   Body mass index is 32.13 kg/m.  Generalized: Well developed,  Obese female in no acute distress  Head: normocephalic and atraumatic,. Oropharynx benign  Neck: Supple, no carotid bruits  Cardiac: Regular rate rhythm, no murmur  Musculoskeletal: No deformity   Neurological examination   Mentation: Alert oriented to time, place, history taking. Attention span and concentration appropriate. Recent and remote  memory intact.  Follows all commands speech and language fluent.   Cranial nerve II-XII: Fundoscopic exam reveals sharp disc margins.Pupils were equal round reactive to light extraocular movements were full, visual field were full on confrontational test. Facial sensation and strength were normal. hearing was intact to finger rubbing bilaterally. Uvula tongue midline. head turning and shoulder shrug were normal and symmetric.Tongue protrusion into cheek strength was normal. Motor: normal bulk and tone, full strength in the BUE, BLE, fine finger movements normal, no pronator drift. No focal weakness Sensory: normal and symmetric to light touch, pinprick, and  Vibration,  In the upper and lower extremities Coordination: finger-nose-finger, heel-to-shin bilaterally, no dysmetria Reflexes:  1+ upper lower and symmetic responses were flexor bilaterally. Gait and Station: Rising up from seated position without assistance, normal stance,  moderate stride, good arm swing, smooth turning, able to  perform tiptoe, and heel walking without difficulty. Tandem gait is steady  DIAGNOSTIC DATA (LABS, IMAGING, TESTING) - I reviewed patient records, labs, notes, testing and imaging myself where available.  Lab Results  Component Value Date   WBC 6.0 03/15/2017   HGB 15.0 03/15/2017   HCT 44.0 03/15/2017   MCV 88.2 03/15/2017   PLT 288 03/15/2017      Component Value Date/Time   NA 140 03/15/2017 1045   K 3.9 03/15/2017 1045   CL 103 03/15/2017 1045   CO2 25 03/15/2017 1029   GLUCOSE 114 (H) 03/15/2017 1045   BUN 16 03/15/2017 1045   CREATININE 0.70 03/15/2017 1045   CALCIUM 9.1 03/15/2017 1029   PROT 6.9 03/15/2017 1029   ALBUMIN 3.8 03/15/2017 1029   AST 20 03/15/2017 1029   ALT 17 03/15/2017 1029   ALKPHOS 123 03/15/2017 1029   BILITOT 0.8 03/15/2017 1029   GFRNONAA >60 03/15/2017 1029   GFRAA >60 03/15/2017 1029   Lab Results  Component Value Date   CHOL 177 03/16/2017   HDL 55 03/16/2017     LDLCALC 105 (H) 03/16/2017   TRIG 84 03/16/2017   CHOLHDL 3.2 03/16/2017   Lab Results  Component Value Date   HGBA1C 5.8 (H) 03/16/2017    ASSESSMENT AND PLAN  61 y.o. year old female   Here for hospital follow-up for stroke right corona and radiata. She was not on aspirin prior to hospital admission. CTA of the head unremarkable no large vessel occlusion LDL 105. 2-D Echo 50-55%. Hemoglobin A1c 5.8.   PLAN: Stressed the importance of management of risk factors to prevent further stroke Continue aspirin for secondary stroke prevention Maintain strict control of hypertension with blood pressure goal below 130/90, today's reading 110/63 Control of diabetes with hemoglobin A1c below 6.5 followed by primary care most recent hemoglobin A1c5.8 Cholesterol with LDL cholesterol less than 70, followed by primary care,  most recent 105 continue statin drugs lipitor Exercise by walking, try yoga for stress reduction  eat healthy diet with whole grains,  fresh fruits and vegetables Follow up in 6 months Discussed risk for recurrent stroke/ TIA and answered additional questions This was a visit requiring 30 minutes of  medical decision making of high complexity with extensive review of history, hospital chart, counseling and answering questions Dennie Bible, [facility], [facility], APRN  [facility] [address] [place], [place] [address] [phone]

## 2017-05-19 ENCOUNTER — Encounter: Payer: Self-pay | Admitting: Nurse Practitioner

## 2017-05-19 ENCOUNTER — Ambulatory Visit (INDEPENDENT_AMBULATORY_CARE_PROVIDER_SITE_OTHER): Payer: PRIVATE HEALTH INSURANCE | Admitting: Nurse Practitioner

## 2017-05-19 VITALS — BP 110/63 | HR 59 | Ht 64.0 in | Wt 187.2 lb

## 2017-05-19 DIAGNOSIS — I639 Cerebral infarction, unspecified: Secondary | ICD-10-CM | POA: Diagnosis not present

## 2017-05-19 DIAGNOSIS — E785 Hyperlipidemia, unspecified: Secondary | ICD-10-CM

## 2017-05-19 NOTE — Patient Instructions (Signed)
Stressed the importance of management of risk factors to prevent further stroke Continue aspirin for secondary stroke prevention Maintain strict control of hypertension with blood pressure goal below 130/90, today's reading 110/63 Control of diabetes with hemoglobin A1c below 6.5 followed by primary care most recent hemoglobin A1c5.8 Cholesterol with LDL cholesterol less than 70, followed by primary care,  most recent 105 continue statin drugs lipitor Exercise by walking, try yoga for stress reduction  eat healthy diet with whole grains,  fresh fruits and vegetables Follow up in 6 months

## 2017-05-21 NOTE — Progress Notes (Signed)
I reviewed above note and agree with the assessment and plan.  Rosalin Hawking, MD PhD Stroke Neurology 05/21/2017 4:41 PM

## 2017-11-12 NOTE — Progress Notes (Signed)
[facility]  PATIENT: Kelly Warner DOB: 05-19-1956   REASON FOR VISIT:   Follow up for stroke right corona and radiata HISTORY FROM:patient    HISTORY OF PRESENT ILLNESS:Per RecordCharlene M Robsonis a 62 y.o.femalewith a history of mild hypertension and migraine headaches.Last week the patient had neck and occipital pain which was different from her usual migraine headaches. She did take migraine medication with no relief. She finally put an ice pack to the area. She had intermittent relief.  On the morning of 03/15/2017 she awoke at approximately 4 or5 AM again with neck and occipital pain. She applied an ice pack and fell back to sleep. She later got up at around 8 AM and with her husband and granddaughter went for a short walk outside. She noticed that her left arm and left leg felt heavy. She began to drag her left foot. They went back to the house. She took an aspirin 325 mgs and went to lie on the couch. She was still having symptoms after she had rested. She decided to come to the emergency room for further evaluation.  Left arm symptoms had resolved by the time the patient was examined by the Neurohospitalist.  The patient continues to endorse left-leg heaviness in her left leg. She is not on antiplatelet therapy on a regular basis. She takes aspirin as needed for mild discomfort. An MRIrevealed an acute nonhemorrhagic 8 mm white matter infarct within the right corona and radiata, potentially involving the cortical spinal tracts.  Date last known well: Date: 03/15/2017 Time last known well: Time: 05:00   Patient was not administered IV t-PA secondary to minimal deficits on arrival. She was admitted to General Neurology for further evaluation and treatment. SUBJECTIVE (INTERVAL HISTORY)Dr. Erlinda Hong Her husband and daughter are at the bedside.  The patient is awake, alert, and answers all questions appropriately.  On exam she continues to endorse slight weakness  around her right vastus lateralis, but has a normal gait.  Of note, the patient has a history of migraines, and has been taking zolmitriptan. Admitted that occasionally she will have heart fluttering lasting just seconds but not often.   Interval hx  09/18/2018CMMs. Baldridge,  62 year old female returns  for hospital follow-up after stroke event in July 2018.  CTA of the head unremarkable no large vessel occlusion LDL 105. 2-D Echo 50-55%. Hemoglobin A1c 5.8.  She is currently on aspirin 0.81 daily without recurrent stroke or TIA symptoms. She has no bruising and no bleeding.  She is on Lipitor 40 mg daily without complaints of myalgias. She is due to get labs next month at primary care.  30 day cardiac event without atrial fib , no PVCs or PACs. Palpitations correlated with normal sinus rhythm. She execises by walking.  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  She returns for reevaluaon UPDATE 3/18/2019CM [name], 62 year old female returns for follow-up with history of stroke in July 2018.  She remains on aspirin for secondary stroke prevention without further stroke or TIA symptoms.  She has minimal bruising and no bleeding.  Blood pressure in the office today 114/70.  She remains on Lipitor without myalgias.  She gets little exercise and was encouraged to increase her exercise daily.  Labs are followed by primary care.  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  She is bothered by seasonal allergies.   She returns for reevaluation without further neurologic symptoms.  She has a prior history of migraines and knows  she is not to take triptan with history of stroke. REVIEW OF SYSTEMS: Full 14 system review of systems performed and notable only for those listed, all others are neg:  Constitutional: neg  Cardiovascular: neg Ear/Nose/Throat: neg  Skin: neg Eyes:  Light sensitivity Respiratory: neg Gastroitestinal: neg  Hematology/Lymphatic: neg  Endocrine:  neg Musculoskeletal:neg Allergy/Immunology: Seasonal Neurological:  Occasional headache Psychiatric:  anxiety Sleep : neg   ALLERGIES: No Known Allergies  HOME MEDICATIONS: Outpatient Medications Prior to Visit  Medication Sig Dispense Refill  . aspirin EC 81 MG tablet Take 1 tablet (81 mg total) by mouth daily. 30 tablet 0  . atenolol (TENORMIN) 25 MG tablet 12.5 mg daily.  0  . atorvastatin (LIPITOR) 40 MG tablet 40 mg daily.  3  . Calcium Acetate, Phos Binder, (CALCIUM ACETATE PO) Take 1,000 mg by mouth daily.    . citalopram (CELEXA) 20 MG tablet Take 20 mg by mouth daily.    . cyclobenzaprine (FLEXERIL) 5 MG tablet Take 5 mg by mouth 3 (three) times daily as needed for muscle spasms.   0  . loratadine (CLARITIN) 10 MG tablet Take 10 mg by mouth daily as needed for allergies.    . mometasone (NASONEX) 50 MCG/ACT nasal spray Place 2 sprays into the nose daily as needed (congestion).    . naproxen sodium (ANAPROX) 220 MG tablet Take 220 mg by mouth daily as needed (pain).    . pantoprazole (PROTONIX) 40 MG tablet Take 40 mg by mouth daily.  3   No facility-administered medications prior to visit.     PAST MEDICAL HISTORY: Past Medical History:  Diagnosis Date  . GERD (gastroesophageal reflux disease)   . Hypertension   . Migraines   . Stroke [facility])     PAST SURGICAL HISTORY: Past Surgical History:  Procedure Laterality Date  . ABDOMINAL HYSTERECTOMY    . NASAL FRACTURE SURGERY    . SHOULDER ARTHROSCOPY Right   . TONSILLECTOMY      FAMILY HISTORY: Family History  Problem Relation Age of Onset  . Atrial fibrillation Mother   . Hypertension Mother   . Heart attack Father   . Atrial fibrillation Brother   . Hypertension Brother     SOCIAL HISTORY: Social History   Socioeconomic History  . Marital status: Married    Spouse name: Shanon Brow  . Number of children: 3  . Years of education: 4  . Highest education level: Not on file  Social Needs  . Financial  resource strain: Not on file  . Food insecurity - worry: Not on file  . Food insecurity - inability: Not on file  . Transportation needs - medical: Not on file  . Transportation needs - non-medical: Not on file  Occupational History    Comment: RN medical records  Tobacco Use  . Smoking status: Never Smoker  . Smokeless tobacco: Never Used  Substance and Sexual Activity  . Alcohol use: Yes    Comment: occ  . Drug use: No  . Sexual activity: Not on file  Other Topics Concern  . Not on file  Social History Narrative   Lives with husband   Caffeine - none     PHYSICAL EXAM  Vitals:   11/16/17 1346  BP: 114/70  Pulse: 72   There is no height or weight on file to calculate BMI.  Generalized: Well developed,  Obese female in no acute distress  Head: normocephalic and atraumatic,. Oropharynx benign  Neck: Supple, no carotid bruits  Cardiac: Regular rate rhythm, no murmur  Musculoskeletal: No deformity   Neurological examination   Mentation: Alert oriented to time, place, history taking. Attention span and concentration appropriate. Recent and remote memory intact.  Follows all commands speech and language fluent.   Cranial nerve II-XII: Pupils were equal round reactive to light extraocular movements were full, visual field were full on confrontational test. Facial sensation and strength were normal. hearing was intact to finger rubbing bilaterally. Uvula tongue midline. head turning and shoulder shrug were normal and symmetric.Tongue protrusion into cheek strength was normal. Motor: normal bulk and tone, full strength in the BUE, BLE, fine finger movements normal, no pronator drift. No focal weakness Sensory: normal and symmetric to light touch, pinprick, and  Vibration,  In the upper and lower extremities Coordination: finger-nose-finger, heel-to-shin bilaterally, no dysmetria Reflexes:  1+ upper lower and symmetic responses were flexor bilaterally. Gait and Station: Rising  up from seated position without assistance, normal stance,  moderate stride, good arm swing, smooth turning, able to perform tiptoe, and heel walking without difficulty. Tandem gait is steady  DIAGNOSTIC DATA (LABS, IMAGING, TESTING) - I reviewed patient records, labs, notes, testing and imaging myself where available.  Lab Results  Component Value Date   WBC 6.0 03/15/2017   HGB 15.0 03/15/2017   HCT 44.0 03/15/2017   MCV 88.2 03/15/2017   PLT 288 03/15/2017      Component Value Date/Time   NA 140 03/15/2017 1045   K 3.9 03/15/2017 1045   CL 103 03/15/2017 1045   CO2 25 03/15/2017 1029   GLUCOSE 114 (H) 03/15/2017 1045   BUN 16 03/15/2017 1045   CREATININE 0.70 03/15/2017 1045   CALCIUM 9.1 03/15/2017 1029   PROT 6.9 03/15/2017 1029   ALBUMIN 3.8 03/15/2017 1029   AST 20 03/15/2017 1029   ALT 17 03/15/2017 1029   ALKPHOS 123 03/15/2017 1029   BILITOT 0.8 03/15/2017 1029   GFRNONAA >60 03/15/2017 1029   GFRAA >60 03/15/2017 1029   Lab Results  Component Value Date   CHOL 177 03/16/2017   HDL 55 03/16/2017   LDLCALC 105 (H) 03/16/2017   TRIG 84 03/16/2017   CHOLHDL 3.2 03/16/2017   Lab Results  Component Value Date   HGBA1C 5.8 (H) 03/16/2017    ASSESSMENT AND PLAN  62 y.o. year old female   Here for  follow-up for stroke right corona and radiata. She was not on aspirin prior to hospital admission. CTA of the head unremarkable no large vessel occlusion LDL 105. 2-D Echo 50-55%. Hemoglobin A1c 5.8.  She remains on aspirin and Lipitor without recurrent stroke or TIA symptoms.   PLAN: Stressed the importance of management of risk factors to prevent further stroke Continue aspirin for secondary stroke prevention Maintain strict control of hypertension with blood pressure goal below 130/90, today's reading 114/70 Control of diabetes with hemoglobin A1c below 6.5 followed by primary care  Cholesterol with LDL cholesterol less than 70, followed by primary care,  continue  statin drugs lipitor Exercise by walking, recommend 30 minutes daily  eat healthy diet with whole grains,  fresh fruits and vegetables Will discharge from stroke clinic I spent 25 minutes in total face to face time with the patient more than 50% of which was spent counseling and coordination of care, reviewing test results reviewing medications and discussing and reviewing the diagnosis of stroke and management of risk factors Dennie Bible, [facility], [facility], APRN  [facility] [address]  [place], [place] [address] [phone]

## 2017-11-16 ENCOUNTER — Encounter: Payer: Self-pay | Admitting: Nurse Practitioner

## 2017-11-16 ENCOUNTER — Ambulatory Visit: Payer: PRIVATE HEALTH INSURANCE | Admitting: Nurse Practitioner

## 2017-11-16 VITALS — BP 114/70 | HR 72

## 2017-11-16 DIAGNOSIS — Z8673 Personal history of transient ischemic attack (TIA), and cerebral infarction without residual deficits: Secondary | ICD-10-CM | POA: Insufficient documentation

## 2017-11-16 DIAGNOSIS — I10 Essential (primary) hypertension: Secondary | ICD-10-CM | POA: Diagnosis not present

## 2017-11-16 DIAGNOSIS — E785 Hyperlipidemia, unspecified: Secondary | ICD-10-CM

## 2017-11-16 DIAGNOSIS — G43709 Chronic migraine without aura, not intractable, without status migrainosus: Secondary | ICD-10-CM | POA: Diagnosis not present

## 2017-11-16 NOTE — Patient Instructions (Addendum)
Stressed the importance of management of risk factors to prevent further stroke Continue aspirin for secondary stroke prevention Maintain strict control of hypertension with blood pressure goal below 130/90, today's reading 114/70 Control of diabetes with hemoglobin A1c below 6.5 followed by primary care  Cholesterol with LDL cholesterol less than 70, followed by primary care,  continue statin drugs lipitor Exercise by walking, recommend 30 minutes daily  eat healthy diet with whole grains,  fresh fruits and vegetables Will discharge from stroke clinic  Stroke Prevention Some medical conditions and behaviors are associated with a higher chance of having a stroke. You can help prevent a stroke by making nutrition, lifestyle, and other changes, including managing any medical conditions you may have. What nutrition changes can be made?  Eat healthy foods. You can do this by: ? Choosing foods high in fiber, such as fresh fruits and vegetables and whole grains. ? Eating at least 5 or more servings of fruits and vegetables a day. Try to fill half of your plate at each meal with fruits and vegetables. ? Choosing lean protein foods, such as lean cuts of meat, poultry without skin, fish, tofu, beans, and nuts. ? Eating low-fat dairy products. ? Avoiding foods that are high in salt (sodium). This can help lower blood pressure. ? Avoiding foods that have saturated fat, trans fat, and cholesterol. This can help prevent high cholesterol. ? Avoiding processed and premade foods.  Follow your health care provider's specific guidelines for losing weight, controlling high blood pressure (hypertension), lowering high cholesterol, and managing diabetes. These may include: ? Reducing your daily calorie intake. ? Limiting your daily sodium intake to 1,500 milligrams (mg). ? Using only healthy fats for cooking, such as olive oil, canola oil, or sunflower oil. ? Counting your daily carbohydrate intake. What  lifestyle changes can be made?  Maintain a healthy weight. Talk to your health care provider about your ideal weight.  Get at least 30 minutes of moderate physical activity at least 5 days a week. Moderate activity includes brisk walking, biking, and swimming.  Do not use any products that contain nicotine or tobacco, such as cigarettes and e-cigarettes. If you need help quitting, ask your health care provider. It may also be helpful to avoid exposure to secondhand smoke.  Limit alcohol intake to no more than 1 drink a day for nonpregnant women and 2 drinks a day for men. One drink equals 12 oz of beer, 5 oz of wine, or 1 oz of hard liquor.  Stop any illegal drug use.  Avoid taking birth control pills. Talk to your health care provider about the risks of taking birth control pills if: ? You are over 42 years old. ? You smoke. ? You get migraines. ? You have ever had a blood clot. What other changes can be made?  Manage your cholesterol levels. ? Eating a healthy diet is important for preventing high cholesterol. If cholesterol cannot be managed through diet alone, you may also need to take medicines. ? Take any prescribed medicines to control your cholesterol as told by your health care provider.  Manage your diabetes. ? Eating a healthy diet and exercising regularly are important parts of managing your blood sugar. If your blood sugar cannot be managed through diet and exercise, you may need to take medicines. ? Take any prescribed medicines to control your diabetes as told by your health care provider.  Control your hypertension. ? To reduce your risk of stroke, try to keep your blood  pressure below 130/80. ? Eating a healthy diet and exercising regularly are an important part of controlling your blood pressure. If your blood pressure cannot be managed through diet and exercise, you may need to take medicines. ? Take any prescribed medicines to control hypertension as told by your  health care provider. ? Ask your health care provider if you should monitor your blood pressure at home. ? Have your blood pressure checked every year, even if your blood pressure is normal. Blood pressure increases with age and some medical conditions.  Get evaluated for sleep disorders (sleep apnea). Talk to your health care provider about getting a sleep evaluation if you snore a lot or have excessive sleepiness.  Take over-the-counter and prescription medicines only as told by your health care provider. Aspirin or blood thinners (antiplatelets or anticoagulants) may be recommended to reduce your risk of forming blood clots that can lead to stroke.  Make sure that any other medical conditions you have, such as atrial fibrillation or atherosclerosis, are managed. What are the warning signs of a stroke? The warning signs of a stroke can be easily remembered as BEFAST.  B is for balance. Signs include: ? Dizziness. ? Loss of balance or coordination. ? Sudden trouble walking.  E is for eyes. Signs include: ? A sudden change in vision. ? Trouble seeing.  F is for face. Signs include: ? Sudden weakness or numbness of the face. ? The face or eyelid drooping to one side.  A is for arms. Signs include: ? Sudden weakness or numbness of the arm, usually on one side of the body.  S is for speech. Signs include: ? Trouble speaking (aphasia). ? Trouble understanding.  T is for time. ? These symptoms may represent a serious problem that is an emergency. Do not wait to see if the symptoms will go away. Get medical help right away. Call your local emergency services (911 in the [place] Do not drive yourself to the hospital.  Other signs of stroke may include: ? A sudden, severe headache with no known cause. ? Nausea or vomiting. ? Seizure.  Where to find more information: For more information, visit:  American Stroke Association: www.strokeassociation.org  National Stroke Association:  www.stroke.org  Summary  You can prevent a stroke by eating healthy, exercising, not smoking, limiting alcohol intake, and managing any medical conditions you may have.  Do not use any products that contain nicotine or tobacco, such as cigarettes and e-cigarettes. If you need help quitting, ask your health care provider. It may also be helpful to avoid exposure to secondhand smoke.  Remember BEFAST for warning signs of stroke. Get help right away if you or a loved one has any of these signs. This information is not intended to replace advice given to you by your health care provider. Make sure you discuss any questions you have with your health care provider. Document Released: 09/25/2004 Document Revised: 09/23/2016 Document Reviewed: 09/23/2016 Elsevier Interactive Patient Education  [employer].

## 2017-11-17 NOTE — Progress Notes (Signed)
I reviewed above note and agree with the assessment and plan.   Rosalin Hawking, MD PhD Stroke Neurology 11/17/2017 5:19 PM

## 2018-03-11 ENCOUNTER — Encounter: Payer: PRIVATE HEALTH INSURANCE | Attending: Family Medicine | Admitting: Skilled Nursing Facility1

## 2018-03-11 ENCOUNTER — Encounter: Payer: Self-pay | Admitting: Skilled Nursing Facility1

## 2018-03-11 DIAGNOSIS — Z713 Dietary counseling and surveillance: Secondary | ICD-10-CM | POA: Diagnosis present

## 2018-03-11 DIAGNOSIS — Z6833 Body mass index (BMI) 33.0-33.9, adult: Secondary | ICD-10-CM | POA: Diagnosis not present

## 2018-03-11 DIAGNOSIS — I10 Essential (primary) hypertension: Secondary | ICD-10-CM | POA: Insufficient documentation

## 2018-03-11 DIAGNOSIS — I639 Cerebral infarction, unspecified: Secondary | ICD-10-CM | POA: Diagnosis not present

## 2018-03-11 DIAGNOSIS — E669 Obesity, unspecified: Secondary | ICD-10-CM | POA: Diagnosis not present

## 2018-03-11 DIAGNOSIS — K219 Gastro-esophageal reflux disease without esophagitis: Secondary | ICD-10-CM | POA: Diagnosis not present

## 2018-03-11 NOTE — Patient Instructions (Addendum)
To avoid:  Juice Soda peppermint chocolate caffeine High fat/greasy foods  -Aim to chew your food until applesauce consistency  -Try alkaline water pH 8.8  -Talk to your doctor about an endoscopy and a sleep study   -Aim for 3 meals a day and 1-2 snacks  -Try not to drink soda regularly   -Aim to be active 30 minutes 5-6 days a week; add in 2 days of resistance

## 2018-03-11 NOTE — Progress Notes (Signed)
  Assessment:  Primary concerns today: GERD.   Pt states she does not sleep well a few nights a week stating she snores and chokes in her sleep. Pt state she says tired throughout the day. Pt states she has a bowel movement more frequently on days than other  she feels the aspirin causes more bowel movements. Pt states she feels she has gluten issues. Pt states she feels bloated and gassy and stomach cramping stating the omeprazole 2 times a day helped. Pt states spicy food does not seem to bother her. Pt states she is still under a lot of stress.   MEDICATIONS: See List   DIETARY INTAKE:  Usual eating pattern includes 3 meals and 2 snacks per day.  Everyday foods include none stated.  Avoided foods include none stated.    24-hr recall:  B ( AM): cottage cheese with fruit Snk ( AM): crackers L ( PM): roast beef rice and green beans Snk ( PM): crackers D ( PM): popccorn or salad or chicken with broccoli and rice  Snk ( PM):  Beverages: water, soda  Usual physical activity: ADL's  Estimated energy needs: 1500 calories 170 g carbohydrates 112 g protein 42 g fat  Progress Towards Goal(s):  In progress.    Intervention:  Nutrition counseling for GERD Goals: To avoid: Juice Soda peppermint chocolate caffeine High fat/greasy foods -Aim to chew your food until applesauce consistency -Try alkaline water pH 8.8 -Talk to your doctor about an endoscopy and a sleep study  -Aim for 3 meals a day and 1-2 snacks -Try not to drink soda regularly  -Aim to be active 30 minutes 5-6 days a week; add in 2 days of resistance  Teaching Method Utilized:  Visual Auditory Hands on  Barriers to learning/adherence to lifestyle change: none stated  Demonstrated degree of understanding via:  Teach Back   Monitoring/Evaluation:  Dietary intake, exercise, and body weight prn.

## 2018-04-09 ENCOUNTER — Encounter: Payer: Self-pay | Admitting: Pulmonary Disease

## 2018-04-09 ENCOUNTER — Ambulatory Visit: Payer: PRIVATE HEALTH INSURANCE | Admitting: Pulmonary Disease

## 2018-04-09 VITALS — BP 130/90 | HR 67 | Ht 64.0 in | Wt 191.4 lb

## 2018-04-09 DIAGNOSIS — G4710 Hypersomnia, unspecified: Secondary | ICD-10-CM | POA: Diagnosis not present

## 2018-04-09 DIAGNOSIS — G4700 Insomnia, unspecified: Secondary | ICD-10-CM | POA: Diagnosis not present

## 2018-04-09 NOTE — Patient Instructions (Signed)
High probability of significant sleep disordered breathing  Hypersomnia  We will schedule a home sleep study  Possible treatment with auto titrating CPAP therapy   I will see you back in the office in 6 to 8 weeks following your sleep study

## 2018-04-09 NOTE — Progress Notes (Signed)
Kelly Warner    [id number]    [birth year]  Primary Care Physician:Kaplan, Baldemar Friday., PA-C  Referring Physician: Aletha Halim., PA-C [address]  [address]  Chief complaint:    History of hypersomnia, witnessed apneas, nonrestorative sleep HPI:  History of nonrestorative sleep Wakes up feeling tired on most nights Has been noted to stop breathing, snorting respirations, headaches  Usually goes to bed about 10-11 PM, takes about 10 minutes to fall asleep, wakes up about 7 AM  Her weight has been relatively stable Admits to headaches  History of allergies, history of allergy to mold and dust  Pets: Dog Occupation: No pertinent occupational history Exposures: Significant exposures Smoking history: Non-smoker  Outpatient Encounter Medications as of 04/09/2018  Medication Sig  . atenolol (TENORMIN) 25 MG tablet 12.5 mg daily.  Marland Kitchen atorvastatin (LIPITOR) 40 MG tablet 40 mg daily.  . Calcium Acetate, Phos Binder, (CALCIUM ACETATE PO) Take 1,000 mg by mouth daily.  . citalopram (CELEXA) 20 MG tablet Take 20 mg by mouth daily.  Marland Kitchen dexlansoprazole (DEXILANT) 60 MG capsule Take 60 mg by mouth daily.  . cyclobenzaprine (FLEXERIL) 5 MG tablet Take 5 mg by mouth 3 (three) times daily as needed for muscle spasms.   Marland Kitchen loratadine (CLARITIN) 10 MG tablet Take 10 mg by mouth daily as needed for allergies.  . mometasone (NASONEX) 50 MCG/ACT nasal spray Place 2 sprays into the nose daily as needed (congestion).  . [DISCONTINUED] naproxen sodium (ANAPROX) 220 MG tablet Take 220 mg by mouth daily as needed (pain).  . [DISCONTINUED] pantoprazole (PROTONIX) 40 MG tablet Take 40 mg by mouth daily.   No facility-administered encounter medications on file as of 04/09/2018.     Allergies as of 04/09/2018  . (No Known Allergies)    Past Medical History:  Diagnosis Date  . GERD (gastroesophageal reflux disease)   . Hypertension   . Migraines   . Stroke [facility])       Past Surgical History:  Procedure Laterality Date  . ABDOMINAL HYSTERECTOMY    . NASAL FRACTURE SURGERY    . SHOULDER ARTHROSCOPY Right   . TONSILLECTOMY      Family History  Problem Relation Age of Onset  . Atrial fibrillation Mother   . Hypertension Mother   . Heart attack Father   . Atrial fibrillation Brother   . Hypertension Brother     Social History   Socioeconomic History  . Marital status: Married    Spouse name: Shanon Brow  . Number of children: 3  . Years of education: 4  . Highest education level: Not on file  Occupational History    Comment: RN medical records  Social Needs  . Financial resource strain: Not on file  . Food insecurity:    Worry: Not on file    Inability: Not on file  . Transportation needs:    Medical: Not on file    Non-medical: Not on file  Tobacco Use  . Smoking status: Never Smoker  . Smokeless tobacco: Never Used  Substance and Sexual Activity  . Alcohol use: Yes    Comment: occ  . Drug use: No  . Sexual activity: Not on file  Lifestyle  . Physical activity:    Days per week: Not on file    Minutes per session: Not on file  . Stress: Not on file  Relationships  . Social connections:    Talks on phone: Not on  file    Gets together: Not on file    Attends religious service: Not on file    Active member of club or organization: Not on file    Attends meetings of clubs or organizations: Not on file    Relationship status: Not on file  . Intimate partner violence:    Fear of current or ex partner: Not on file    Emotionally abused: Not on file    Physically abused: Not on file    Forced sexual activity: Not on file  Other Topics Concern  . Not on file  Social History Narrative   Lives with husband   Caffeine - none    Review of systems: Review of Systems  Constitutional: Negative for fever and chills.  HENT: Negative.   Eyes: Negative for blurred vision.  Respiratory: as per HPI  Cardiovascular: Negative for chest  pain and palpitations.  Gastrointestinal: Negative for vomiting, diarrhea, blood per rectum. Genitourinary: Negative for dysuria, urgency, frequency and hematuria.  Musculoskeletal: Negative for myalgias, back pain and joint pain.  Skin: Negative for itching and rash.  Neurological: Headaches  Endo/Heme/Allergies: Negative for environmental allergies.  Psychiatric/Behavioral: Negative for depression, suicidal ideas and hallucinations.  All other systems reviewed and are negative.  Physical Exam:  Vitals:   04/09/18 1551  BP: 130/90  Pulse: 67  SpO2: 96%   Gen:      No acute distress HEENT:  EOMI, sclera anicteric Neck:     No masses; no thyromegaly Mallampati 3 Lungs:    Clear to auscultation bilaterally; normal respiratory effort CV:         Regular rate and rhythm; no murmurs Abd:      + bowel sounds; soft, non-tender; no palpable masses, no distension Ext:    No edema; adequate peripheral perfusion Skin:      Warm and dry; no rash Neuro: alert and oriented x 3 Psych: normal mood and affect  Data Reviewed: Marland Kitchen  Hospital records reviewed   Assessment:  .  Probability of significant sleep disordered breathing  .  Nonrestorative sleep  .  Excessive daytime sleepiness.  Plan/Recommendations:  .  Recommend a home sleep study  .  Pathophysiology of sleep disordered breathing discussed  .  Treatment options for sleep disordered breathing discussed  .  I will see her back in the office in 6 to 8 weeks following study    Sherrilyn Rist MD [facility] 04/09/2018, 4:26 PM  CC: Aletha Halim., PA-C

## 2018-04-22 ENCOUNTER — Encounter: Payer: Self-pay | Admitting: Pulmonary Disease

## 2018-04-22 ENCOUNTER — Other Ambulatory Visit: Payer: Self-pay | Admitting: Gastroenterology

## 2018-04-22 DIAGNOSIS — R1310 Dysphagia, unspecified: Secondary | ICD-10-CM

## 2018-04-22 NOTE — Telephone Encounter (Signed)
Error

## 2018-04-28 ENCOUNTER — Ambulatory Visit
Admission: RE | Admit: 2018-04-28 | Discharge: 2018-04-28 | Disposition: A | Discharge status: Home / Self Care | Payer: PRIVATE HEALTH INSURANCE | Source: Ambulatory Visit | Attending: Gastroenterology | Admitting: Gastroenterology

## 2018-04-28 DIAGNOSIS — G4733 Obstructive sleep apnea (adult) (pediatric): Secondary | ICD-10-CM | POA: Diagnosis not present

## 2018-04-28 DIAGNOSIS — R1310 Dysphagia, unspecified: Secondary | ICD-10-CM

## 2018-04-29 ENCOUNTER — Other Ambulatory Visit: Payer: Self-pay | Admitting: *Deleted

## 2018-04-29 DIAGNOSIS — G4733 Obstructive sleep apnea (adult) (pediatric): Secondary | ICD-10-CM | POA: Diagnosis not present

## 2018-04-29 DIAGNOSIS — G4710 Hypersomnia, unspecified: Secondary | ICD-10-CM

## 2018-04-30 ENCOUNTER — Telehealth: Payer: Self-pay | Admitting: Pulmonary Disease

## 2018-04-30 DIAGNOSIS — G4733 Obstructive sleep apnea (adult) (pediatric): Secondary | ICD-10-CM

## 2018-04-30 NOTE — Telephone Encounter (Signed)
Dr. Ander Slade has reviewed the home sleep test this showed Severe obstructive sleep apnea.   Recommendations   Treatment options are CPAP with the settings auto 5 to 15.    Weight loss measures .   Advise against driving while sleepy & against medication with sedative side effects.   Attempted to call patient but was unable to reach her I will place order for CPAP once I have spoken to the patient.    Make appointment for 8 to 10 weeks for compliance with download with Dr. Ander Slade.

## 2018-05-05 ENCOUNTER — Telehealth: Payer: Self-pay | Admitting: Pulmonary Disease

## 2018-05-05 NOTE — Telephone Encounter (Signed)
LMTCB

## 2018-05-05 NOTE — Telephone Encounter (Signed)
CPAP therapy is the appropriate treatment for severe sleep apnea  Oral appliance will only come into play if she fails CPAP therapy  Oral appliance is indicated for mild to moderate sleep apnea that is intolerant of CPAP

## 2018-05-05 NOTE — Telephone Encounter (Signed)
Pt is returning call about results. Requesting to be called back at work number [phone]

## 2018-05-05 NOTE — Telephone Encounter (Signed)
Called and spoke to pt and relayed below results.  Pt is wanting to know if oral appliance is an option.  Pt has been scheduled for 06/29/18 with AO.  AO please advise. Thanks

## 2018-05-06 NOTE — Telephone Encounter (Signed)
Please see 9/3 phone note.

## 2018-05-06 NOTE — Telephone Encounter (Signed)
Called spoke with patient  She verbalized understanding  I have placed an order for a cpap. Nothing further needed.

## 2018-06-07 ENCOUNTER — Ambulatory Visit: Payer: PRIVATE HEALTH INSURANCE | Admitting: Pulmonary Disease

## 2018-06-29 ENCOUNTER — Encounter: Payer: Self-pay | Admitting: Pulmonary Disease

## 2018-06-29 ENCOUNTER — Ambulatory Visit: Payer: PRIVATE HEALTH INSURANCE | Admitting: Pulmonary Disease

## 2018-06-29 VITALS — BP 130/70 | HR 82 | Ht 64.0 in | Wt 195.0 lb

## 2018-06-29 DIAGNOSIS — Z9989 Dependence on other enabling machines and devices: Secondary | ICD-10-CM

## 2018-06-29 DIAGNOSIS — G4733 Obstructive sleep apnea (adult) (pediatric): Secondary | ICD-10-CM | POA: Diagnosis not present

## 2018-06-29 NOTE — Progress Notes (Signed)
Kelly Warner    [id number]    01/19/1956  Primary Care Physician:Kaplan, Baldemar Friday., PA-C  Referring Physician: Aletha Halim., PA-C [address], [place] [address]  Chief complaint:    History of hypersomnia, witnessed apneas, nonrestorative sleep Sleep study did reveal severe obstructive sleep apnea   HPI: Feels much better with CPAP use  History of nonrestorative sleep Has been noted to stop breathing, snorting respirations, headaches  Usually goes to bed about 10-11 PM, takes about 10 minutes to fall asleep, wakes up about 7 AM  Her weight has been relatively stable Admits to headaches  History of allergies, history of allergy to mold and dust  Pets: Dog Occupation: No pertinent occupational history Exposures: Significant exposures Smoking history: Non-smoker  Outpatient Encounter Medications as of 06/29/2018  Medication Sig  . aspirin EC 81 MG tablet Take by mouth.  Marland Kitchen atenolol (TENORMIN) 25 MG tablet 12.5 mg daily.  Marland Kitchen atorvastatin (LIPITOR) 40 MG tablet 40 mg daily.  . Calcium Acetate, Phos Binder, (CALCIUM ACETATE PO) Take 1,000 mg by mouth daily.  . citalopram (CELEXA) 20 MG tablet Take 20 mg by mouth daily.  . cyclobenzaprine (FLEXERIL) 5 MG tablet Take 5 mg by mouth 3 (three) times daily as needed for muscle spasms.   Marland Kitchen dexlansoprazole (DEXILANT) 60 MG capsule Take 60 mg by mouth daily.  Marland Kitchen loratadine (CLARITIN) 10 MG tablet Take 10 mg by mouth daily as needed for allergies.  . mometasone (NASONEX) 50 MCG/ACT nasal spray Place 2 sprays into the nose daily as needed (congestion).   No facility-administered encounter medications on file as of 06/29/2018.     Allergies as of 06/29/2018  . (No Known Allergies)    Past Medical History:  Diagnosis Date  . GERD (gastroesophageal reflux disease)   . Hypertension   . Migraines   . Stroke [facility])     Past Surgical History:  Procedure Laterality Date  . ABDOMINAL HYSTERECTOMY      . NASAL FRACTURE SURGERY    . SHOULDER ARTHROSCOPY Right   . TONSILLECTOMY      Family History  Problem Relation Age of Onset  . Atrial fibrillation Mother   . Hypertension Mother   . Heart attack Father   . Atrial fibrillation Brother   . Hypertension Brother     Social History   Socioeconomic History  . Marital status: Married    Spouse name: Shanon Brow  . Number of children: 3  . Years of education: 4  . Highest education level: Not on file  Occupational History    Comment: RN medical records  Social Needs  . Financial resource strain: Not on file  . Food insecurity:    Worry: Not on file    Inability: Not on file  . Transportation needs:    Medical: Not on file    Non-medical: Not on file  Tobacco Use  . Smoking status: Never Smoker  . Smokeless tobacco: Never Used  Substance and Sexual Activity  . Alcohol use: Yes    Comment: occ  . Drug use: No  . Sexual activity: Not on file  Lifestyle  . Physical activity:    Days per week: Not on file    Minutes per session: Not on file  . Stress: Not on file  Relationships  . Social connections:    Talks on phone: Not on file    Gets together: Not on file    Attends  religious service: Not on file    Active member of club or organization: Not on file    Attends meetings of clubs or organizations: Not on file    Relationship status: Not on file  . Intimate partner violence:    Fear of current or ex partner: Not on file    Emotionally abused: Not on file    Physically abused: Not on file    Forced sexual activity: Not on file  Other Topics Concern  . Not on file  Social History Narrative   Lives with husband   Caffeine - none    Review of systems: Review of Systems  Constitutional: Negative for fever and chills.  HENT: Negative.   Eyes: Negative for blurred vision.  Respiratory: as per HPI  Cardiovascular: Negative for chest pain and palpitations.  Gastrointestinal: Negative for vomiting, diarrhea, blood per  rectum. All other systems reviewed and are negative.  Physical Exam:  Vitals:   06/29/18 1139  BP: 130/70  Pulse: 82  SpO2: 97%   Gen:      No acute distress HEENT:  EOMI, sclera anicteric Neck:     No masses; no thyromegaly Mallampati 3 Lungs:    Clear to auscultation bilaterally; normal respiratory effort CV:         Regular rate and rhythm; no murmurs  Data Reviewed: Marland Kitchen  Hospital records reviewed  .  Home sleep study reviewed with the patient .  Compliance data reviewed with the patient  Assessment:  .  Severe obstructive sleep apnea -Optimal treatment with CPAP therapy, -We will require slight bump up in the pressure to 12  .  Nonrestorative sleep -Symptoms are much better present  .  Excessive daytime sleepiness. -Less sleepiness during the day  Plan/Recommendations:  .  Continue CPAP therapy .  Will increase upper limit of pressure to 12 .  We will follow-up with compliance data  .  Pathophysiology of sleep disordered breathing discussed  .  Treatment options for sleep disordered breathing discussed  .  We will see you back in the office in 3 months    Sherrilyn Rist MD [facility] 06/29/2018, 12:02 PM  CC: Aletha Halim., PA-C

## 2018-06-29 NOTE — Patient Instructions (Signed)
Obstructive sleep apnea, adequately treated with CPAP therapy  She does need a mild increase in her CPAP pressures  We will contact the DME company to raise upper limit of the pressure to 12  We will see you back in the office in 3 months  We will continue to keep an eye on the download

## 2018-06-29 NOTE — Addendum Note (Signed)
Addended by: Madolyn Frieze on: 06/29/2018 12:21 PM   Modules accepted: Orders

## 2018-09-29 ENCOUNTER — Ambulatory Visit: Payer: PRIVATE HEALTH INSURANCE | Admitting: Pulmonary Disease

## 2018-10-27 ENCOUNTER — Ambulatory Visit: Payer: PRIVATE HEALTH INSURANCE | Admitting: Pulmonary Disease

## 2018-10-28 ENCOUNTER — Encounter: Payer: Self-pay | Admitting: Pulmonary Disease

## 2018-10-28 ENCOUNTER — Ambulatory Visit (INDEPENDENT_AMBULATORY_CARE_PROVIDER_SITE_OTHER): Payer: 59 | Admitting: Pulmonary Disease

## 2018-10-28 VITALS — BP 112/72 | HR 60 | Ht 64.0 in | Wt 190.0 lb

## 2018-10-28 DIAGNOSIS — Z9989 Dependence on other enabling machines and devices: Secondary | ICD-10-CM

## 2018-10-28 DIAGNOSIS — G4733 Obstructive sleep apnea (adult) (pediatric): Secondary | ICD-10-CM | POA: Diagnosis not present

## 2018-10-28 NOTE — Progress Notes (Signed)
Kelly Warner    [id number]    28-Feb-1956  Primary Care Physician:Kaplan, Baldemar Friday., PA-C  Referring Physician: Aletha Halim., PA-C [address], [place] [address]  Chief complaint:    History of hypersomnia, witnessed apneas, nonrestorative sleep Sleep study did reveal severe obstructive sleep apnea   HPI: Having some pressure issues, nasal stuffiness in the mornings Other masks have not worked as well as the current one  Feels much better with CPAP use  History of nonrestorative sleep Has been noted to stop breathing, snorting respirations, headaches  Usually goes to bed about 10-11 PM, takes about 10 minutes to fall asleep, wakes up about 7 AM  Her weight has been relatively stable Admits to headaches  History of allergies, history of allergy to mold and dust  Pets: Dog Occupation: No pertinent occupational history Exposures: Significant exposures Smoking history: Non-smoker  Outpatient Encounter Medications as of 10/28/2018  Medication Sig  . aspirin EC 81 MG tablet Take by mouth.  Marland Kitchen atenolol (TENORMIN) 25 MG tablet 12.5 mg daily.  Marland Kitchen atorvastatin (LIPITOR) 40 MG tablet 20 mg daily.   . Calcium Acetate, Phos Binder, (CALCIUM ACETATE PO) Take 1,000 mg by mouth daily.  . citalopram (CELEXA) 20 MG tablet Take 20 mg by mouth daily.  . cyclobenzaprine (FLEXERIL) 5 MG tablet Take 5 mg by mouth 3 (three) times daily as needed for muscle spasms.   Marland Kitchen dexlansoprazole (DEXILANT) 60 MG capsule Take 60 mg by mouth daily.  Marland Kitchen loratadine (CLARITIN) 10 MG tablet Take 10 mg by mouth daily as needed for allergies.  . mometasone (NASONEX) 50 MCG/ACT nasal spray Place 2 sprays into the nose daily as needed (congestion).   No facility-administered encounter medications on file as of 10/28/2018.     Allergies as of 10/28/2018  . (No Known Allergies)    Past Medical History:  Diagnosis Date  . GERD (gastroesophageal reflux disease)   . Hypertension     . Migraines   . Stroke [facility])     Past Surgical History:  Procedure Laterality Date  . ABDOMINAL HYSTERECTOMY    . NASAL FRACTURE SURGERY    . SHOULDER ARTHROSCOPY Right   . TONSILLECTOMY      Family History  Problem Relation Age of Onset  . Atrial fibrillation Mother   . Hypertension Mother   . Heart attack Father   . Atrial fibrillation Brother   . Hypertension Brother     Social History   Socioeconomic History  . Marital status: Married    Spouse name: Shanon Brow  . Number of children: 3  . Years of education: 4  . Highest education level: Not on file  Occupational History    Comment: RN medical records  Social Needs  . Financial resource strain: Not on file  . Food insecurity:    Worry: Not on file    Inability: Not on file  . Transportation needs:    Medical: Not on file    Non-medical: Not on file  Tobacco Use  . Smoking status: Never Smoker  . Smokeless tobacco: Never Used  Substance and Sexual Activity  . Alcohol use: Yes    Comment: occ  . Drug use: No  . Sexual activity: Not on file  Lifestyle  . Physical activity:    Days per week: Not on file    Minutes per session: Not on file  . Stress: Not on file  Relationships  . Social  connections:    Talks on phone: Not on file    Gets together: Not on file    Attends religious service: Not on file    Active member of club or organization: Not on file    Attends meetings of clubs or organizations: Not on file    Relationship status: Not on file  . Intimate partner violence:    Fear of current or ex partner: Not on file    Emotionally abused: Not on file    Physically abused: Not on file    Forced sexual activity: Not on file  Other Topics Concern  . Not on file  Social History Narrative   Lives with husband   Caffeine - none    Review of systems: Review of Systems  Constitutional: Negative for fever and chills.  HENT: Negative.   Eyes: Negative for blurred vision.  Respiratory: as per HPI   All other systems reviewed and are negative. No significant changes in her health recently  Physical Exam:  Vitals:   10/28/18 0951  BP: 112/72  Pulse: 60  SpO2: 98%   Gen:      No acute distress HEENT:  EOMI, sclera anicteric Neck:     No masses; Mallampati 3 Lungs:    Clear bilaterally CV:         1 S2 appreciated  Results of the Epworth flowsheet 10/28/2018 04/09/2018  Sitting and reading 0 1  Watching TV 0 1  Sitting, inactive in a public place (e.g. a theatre or a meeting) 0 0  As a passenger in a car for an hour without a break 0 0  Lying down to rest in the afternoon when circumstances permit 3 2  Sitting and talking to someone 0 0  Sitting quietly after a lunch without alcohol 0 0  In a car, while stopped for a few minutes in traffic 0 0  Total score 3 4    Data Reviewed: Marland Kitchen  Compliance data showing excellent compliance with AHI 1.6  .  Home sleep study reviewed with the patient .  Compliance data reviewed with the patient  Assessment:  .  Severe obstructive sleep apnea -Optimal treatment with CPAP therapy, -We will decrease pressure to 5-11  .  Nonrestorative sleep -Symptoms are much better present -Adjusting CPAP pressures will help  .  Excessive daytime sleepiness. -Less sleepiness during the day  Plan/Recommendations:  .  Continue CPAP therapy .  Will change upper limit to 11  .  We will follow-up with compliance data  .  Pathophysiology of sleep disordered breathing discussed  .  We will see you back in the office in 3 months    Sherrilyn Rist MD [facility] 10/28/2018, 10:18 AM  CC: Aletha Halim., PA-C

## 2018-10-28 NOTE — Patient Instructions (Signed)
Obstructive sleep apnea adequately treated with CPAP therapy Issues with pressures-we will decrease your pressure to 5-11 from 5-12 Call us if you are still having significant problems with the pressure change  I will see you back in the office in about 3 months Call with significant concerns

## 2018-12-14 ENCOUNTER — Encounter: Payer: Self-pay | Admitting: Pulmonary Disease

## 2019-01-21 ENCOUNTER — Ambulatory Visit: Payer: 59 | Admitting: Pulmonary Disease

## 2019-04-11 ENCOUNTER — Ambulatory Visit: Payer: 59 | Admitting: Pulmonary Disease

## 2019-05-02 ENCOUNTER — Ambulatory Visit (INDEPENDENT_AMBULATORY_CARE_PROVIDER_SITE_OTHER): Payer: 59 | Admitting: Pulmonary Disease

## 2019-05-02 ENCOUNTER — Other Ambulatory Visit: Payer: Self-pay

## 2019-05-02 ENCOUNTER — Encounter: Payer: Self-pay | Admitting: Pulmonary Disease

## 2019-05-02 VITALS — BP 120/70 | HR 57 | Temp 97.3°F | Ht 64.0 in | Wt 186.6 lb

## 2019-05-02 DIAGNOSIS — Z9989 Dependence on other enabling machines and devices: Secondary | ICD-10-CM | POA: Diagnosis not present

## 2019-05-02 DIAGNOSIS — G4733 Obstructive sleep apnea (adult) (pediatric): Secondary | ICD-10-CM | POA: Diagnosis not present

## 2019-05-02 NOTE — Progress Notes (Signed)
Kelly Warner    [id number]    25-Apr-1956  Primary Care Physician:Kaplan, Baldemar Friday., PA-C  Referring Physician: Aletha Halim., PA-C [address]  [place] [address]  Chief complaint:    History of hypersomnia, witnessed apneas, nonrestorative sleep Sleep study did reveal severe obstructive sleep apnea Compliant with CPAP use   HPI: Headaches, some nasal pressure Mask works okay She still feels she is adjusting to CPAP  Other masks have not worked as well as the current one  Feels much better with CPAP use  History significant for History of nonrestorative sleep Has been noted to stop breathing, snorting respirations, headaches  Usually goes to bed about 10-11 PM, takes about 10 minutes to fall asleep, wakes up about 7 AM  Her weight has been relatively stable Admits to headaches  History of allergies, history of allergy to mold and dust  Pets: Dog Occupation: No pertinent occupational history Exposures: Significant exposures Smoking history: Non-smoker  Outpatient Encounter Medications as of 05/02/2019  Medication Sig  . aspirin EC 81 MG tablet Take by mouth.  Marland Kitchen atenolol (TENORMIN) 25 MG tablet 12.5 mg daily.  Marland Kitchen atorvastatin (LIPITOR) 40 MG tablet 20 mg daily.   . Calcium Acetate, Phos Binder, (CALCIUM ACETATE PO) Take 1,000 mg by mouth daily.  . citalopram (CELEXA) 20 MG tablet Take 20 mg by mouth daily.  Marland Kitchen dexlansoprazole (DEXILANT) 60 MG capsule Take 60 mg by mouth daily.  Marland Kitchen loratadine (CLARITIN) 10 MG tablet Take 10 mg by mouth daily as needed for allergies.  . mometasone (NASONEX) 50 MCG/ACT nasal spray Place 2 sprays into the nose daily as needed (congestion).  . [DISCONTINUED] cyclobenzaprine (FLEXERIL) 5 MG tablet Take 5 mg by mouth 3 (three) times daily as needed for muscle spasms.    No facility-administered encounter medications on file as of 05/02/2019.     Allergies as of 05/02/2019  . (No Known Allergies)    Past  Medical History:  Diagnosis Date  . GERD (gastroesophageal reflux disease)   . Hypertension   . Migraines   . Stroke [facility])     Past Surgical History:  Procedure Laterality Date  . ABDOMINAL HYSTERECTOMY    . NASAL FRACTURE SURGERY    . SHOULDER ARTHROSCOPY Right   . TONSILLECTOMY      Family History  Problem Relation Age of Onset  . Atrial fibrillation Mother   . Hypertension Mother   . Heart attack Father   . Atrial fibrillation Brother   . Hypertension Brother     Social History   Socioeconomic History  . Marital status: Married    Spouse name: Shanon Brow  . Number of children: 3  . Years of education: 4  . Highest education level: Not on file  Occupational History    Comment: RN medical records  Social Needs  . Financial resource strain: Not on file  . Food insecurity    Worry: Not on file    Inability: Not on file  . Transportation needs    Medical: Not on file    Non-medical: Not on file  Tobacco Use  . Smoking status: Never Smoker  . Smokeless tobacco: Never Used  Substance and Sexual Activity  . Alcohol use: Yes    Comment: occ  . Drug use: No  . Sexual activity: Not on file  Lifestyle  . Physical activity    Days per week: Not on file    Minutes per  session: Not on file  . Stress: Not on file  Relationships  . Social Herbalist on phone: Not on file    Gets together: Not on file    Attends religious service: Not on file    Active member of club or organization: Not on file    Attends meetings of clubs or organizations: Not on file    Relationship status: Not on file  . Intimate partner violence    Fear of current or ex partner: Not on file    Emotionally abused: Not on file    Physically abused: Not on file    Forced sexual activity: Not on file  Other Topics Concern  . Not on file  Social History Narrative   Lives with husband   Caffeine - none    Review of systems: Headaches in the mornings Does not feel acutely ill No  shortness of breath with exertion No significant musculoskeletal pains or discomfort No significant changes in her health since last visit  Physical Exam:  Vitals:   05/02/19 1006  BP: 120/70  Pulse: (!) 57  Temp: (!) 97.3 F (36.3 C)  SpO2: 96%   Gen:      Middle-aged lady, does not appear to be in respiratory distress HEENT: No significant abnormality Neck:     Supple, no JVD Lungs:    Clear bilaterally, no wheezing, no rales CV:         S1-S2 appreciated  Results of the Epworth flowsheet 10/28/2018 04/09/2018  Sitting and reading 0 1  Watching TV 0 1  Sitting, inactive in a public place (e.g. a theatre or a meeting) 0 0  As a passenger in a car for an hour without a break 0 0  Lying down to rest in the afternoon when circumstances permit 3 2  Sitting and talking to someone 0 0  Sitting quietly after a lunch without alcohol 0 0  In a car, while stopped for a few minutes in traffic 0 0  Total score 3 4    Data Reviewed: Marland Kitchen  Compliance data showing excellent compliance with AHI 1.9  .  Compliance data reviewed with the patient  Assessment:  .  Severe obstructive sleep apnea -Attest optimally treated with CPAP therapy -Continue with CPAP-auto titration with maximum pressure setting of 11  .  Nonrestorative sleep -Symptoms are much better  .  Excessive daytime sleepiness. -Less sleepiness during the day  Plan/Recommendations: .  Continue CPAP therapy .  No changes to CPAP  .  Continue monitoring with compliance data monitoring  .  Encouraged to get adequate hours of sleep  .  I will see her back in the office in about 6 months  .  Encouraged to continue DME company if any mask issues   Sherrilyn Rist MD [facility] 05/02/2019, 10:27 AM  CC: Aletha Halim., PA-C

## 2019-05-02 NOTE — Patient Instructions (Signed)
Obstructive sleep apnea with excellent compliance with CPAP  Continue CPAP at present  Nasal steroid use may help pressure symptoms/headaches May also need antibiotics if things progress  Call with concerns  We will see you back in 6 months

## 2020-02-23 ENCOUNTER — Other Ambulatory Visit: Payer: Self-pay | Admitting: Gastroenterology

## 2020-04-16 ENCOUNTER — Other Ambulatory Visit (HOSPITAL_COMMUNITY)
Admission: RE | Admit: 2020-04-16 | Discharge: 2020-04-16 | Disposition: A | Discharge status: Home / Self Care | Payer: Medicare HMO | Source: Ambulatory Visit | Attending: Gastroenterology | Admitting: Gastroenterology

## 2020-04-16 DIAGNOSIS — Z01812 Encounter for preprocedural laboratory examination: Secondary | ICD-10-CM | POA: Diagnosis present

## 2020-04-16 DIAGNOSIS — Z20822 Contact with and (suspected) exposure to covid-19: Secondary | ICD-10-CM | POA: Diagnosis not present

## 2020-04-16 LAB — SARS CORONAVIRUS 2 (TAT 6-24 HRS): SARS Coronavirus 2: NEGATIVE

## 2020-04-18 NOTE — Anesthesia Preprocedure Evaluation (Addendum)
Anesthesia Evaluation  Patient identified by MRN, date of birth, ID band Patient awake    Reviewed: Allergy & Precautions, NPO status , Patient's Chart, lab work & pertinent test results  Airway Mallampati: II  TM Distance: >3 FB Neck ROM: Full    Dental no notable dental hx. (+) Teeth Intact, Dental Advisory Given   Pulmonary sleep apnea and Continuous Positive Airway Pressure Ventilation ,    Pulmonary exam normal breath sounds clear to auscultation       Cardiovascular hypertension, Pt. on medications Normal cardiovascular exam Rhythm:Regular Rate:Normal  7/18 TTE Left ventricle: The cavity size was normal. There was mild focal  basal hypertrophy of the septum. Systolic function was normal.  The estimated ejection fraction was in the range of 50% to 55%.  Wall motion was normal; there were no regional wall motion  abnormalities. Doppler parameters are consistent with abnormal  left ventricular relaxation (grade 1 diastolic dysfunction).  - Aortic valve: There was trivial regurgitation.    Neuro/Psych  Headaches, CVA    GI/Hepatic Neg liver ROS, GERD  ,  Endo/Other  negative endocrine ROS  Renal/GU negative Renal ROS  negative genitourinary   Musculoskeletal negative musculoskeletal ROS (+)   Abdominal   Peds  Hematology negative hematology ROS (+)   Anesthesia Other Findings   Reproductive/Obstetrics                           Anesthesia Physical Anesthesia Plan  ASA: III  Anesthesia Plan: MAC   Post-op Pain Management:    Induction:   PONV Risk Score and Plan: Treatment may vary due to age or medical condition  Airway Management Planned: Natural Airway  Additional Equipment: None  Intra-op Plan:   Post-operative Plan:   Informed Consent: I have reviewed the patients History and Physical, chart, labs and discussed the procedure including the risks, benefits and  alternatives for the proposed anesthesia with the patient or authorized representative who has indicated his/her understanding and acceptance.     Dental advisory given  Plan Discussed with: CRNA  Anesthesia Plan Comments: (screeninc colopnoscopy under Mac)      Anesthesia Quick Evaluation

## 2020-04-19 ENCOUNTER — Ambulatory Visit (HOSPITAL_COMMUNITY): Payer: Medicare HMO | Admitting: Anesthesiology

## 2020-04-19 ENCOUNTER — Encounter (HOSPITAL_COMMUNITY)
Admission: RE | Disposition: A | Discharge status: Home / Self Care | Payer: Self-pay | Source: Home / Self Care | Attending: Gastroenterology

## 2020-04-19 ENCOUNTER — Ambulatory Visit (HOSPITAL_COMMUNITY)
Admission: RE | Admit: 2020-04-19 | Discharge: 2020-04-19 | Disposition: A | Discharge status: Home / Self Care | Payer: Medicare HMO | Attending: Gastroenterology | Admitting: Gastroenterology

## 2020-04-19 ENCOUNTER — Other Ambulatory Visit: Payer: Self-pay

## 2020-04-19 ENCOUNTER — Encounter (HOSPITAL_COMMUNITY): Payer: Self-pay | Admitting: Gastroenterology

## 2020-04-19 DIAGNOSIS — Z7982 Long term (current) use of aspirin: Secondary | ICD-10-CM | POA: Insufficient documentation

## 2020-04-19 DIAGNOSIS — D123 Benign neoplasm of transverse colon: Secondary | ICD-10-CM | POA: Insufficient documentation

## 2020-04-19 DIAGNOSIS — Z79899 Other long term (current) drug therapy: Secondary | ICD-10-CM | POA: Insufficient documentation

## 2020-04-19 DIAGNOSIS — D122 Benign neoplasm of ascending colon: Secondary | ICD-10-CM | POA: Insufficient documentation

## 2020-04-19 DIAGNOSIS — Z1211 Encounter for screening for malignant neoplasm of colon: Secondary | ICD-10-CM | POA: Insufficient documentation

## 2020-04-19 DIAGNOSIS — Z8249 Family history of ischemic heart disease and other diseases of the circulatory system: Secondary | ICD-10-CM | POA: Insufficient documentation

## 2020-04-19 DIAGNOSIS — Z79890 Hormone replacement therapy (postmenopausal): Secondary | ICD-10-CM | POA: Diagnosis not present

## 2020-04-19 DIAGNOSIS — Z8673 Personal history of transient ischemic attack (TIA), and cerebral infarction without residual deficits: Secondary | ICD-10-CM | POA: Insufficient documentation

## 2020-04-19 DIAGNOSIS — G4730 Sleep apnea, unspecified: Secondary | ICD-10-CM | POA: Insufficient documentation

## 2020-04-19 DIAGNOSIS — K219 Gastro-esophageal reflux disease without esophagitis: Secondary | ICD-10-CM | POA: Insufficient documentation

## 2020-04-19 DIAGNOSIS — I10 Essential (primary) hypertension: Secondary | ICD-10-CM | POA: Diagnosis not present

## 2020-04-19 HISTORY — PX: POLYPECTOMY: SHX5525

## 2020-04-19 HISTORY — PX: COLONOSCOPY WITH PROPOFOL: SHX5780

## 2020-04-19 SURGERY — COLONOSCOPY WITH PROPOFOL
Anesthesia: Monitor Anesthesia Care

## 2020-04-19 MED ORDER — PROPOFOL 10 MG/ML IV BOLUS
INTRAVENOUS | Status: DC | PRN
  Administered 2020-04-19 (×3): 20 mg via INTRAVENOUS

## 2020-04-19 MED ORDER — LACTATED RINGERS IV SOLN
INTRAVENOUS | Status: DC
  Administered 2020-04-19: 1000 mL via INTRAVENOUS

## 2020-04-19 MED ORDER — PROPOFOL 500 MG/50ML IV EMUL
INTRAVENOUS | Status: AC
  Filled 2020-04-19: qty 50

## 2020-04-19 MED ORDER — PROPOFOL 500 MG/50ML IV EMUL
INTRAVENOUS | Status: DC | PRN
  Administered 2020-04-19: 100 ug/kg/min via INTRAVENOUS

## 2020-04-19 MED ORDER — SODIUM CHLORIDE 0.9 % IV SOLN: INTRAVENOUS | Status: DC

## 2020-04-19 MED ORDER — LIDOCAINE 2% (20 MG/ML) 5 ML SYRINGE
INTRAMUSCULAR | Status: DC | PRN
  Administered 2020-04-19: 100 mg via INTRAVENOUS

## 2020-04-19 SURGICAL SUPPLY — 21 items

## 2020-04-19 NOTE — Transfer of Care (Signed)
Immediate Anesthesia Transfer of Care Note  Patient: Kelly Warner  Procedure(s) Performed: COLONOSCOPY WITH PROPOFOL (N/A )  Patient Location: Endoscopy Unit  Anesthesia Type:MAC  Level of Consciousness: awake  Airway & Oxygen Therapy: Patient Spontanous Breathing and Patient connected to face mask oxygen  Post-op Assessment: Report given to RN and Post -op Vital signs reviewed and stable  Post vital signs: Reviewed and stable  Last Vitals:  Vitals Value Taken Time  BP    Temp    Pulse    Resp    SpO2      Last Pain:  Vitals:   04/19/20 0701  TempSrc: Oral  PainSc: 0-No pain         Complications: No complications documented.

## 2020-04-19 NOTE — Anesthesia Procedure Notes (Signed)
Date/Time: 04/19/2020 7:23 AM Performed by: Sharlette Dense, CRNA Oxygen Delivery Method: Simple face mask

## 2020-04-19 NOTE — Anesthesia Postprocedure Evaluation (Signed)
Anesthesia Post Note  Patient: Kelly Warner  Procedure(s) Performed: COLONOSCOPY WITH PROPOFOL (N/A )     Patient location during evaluation: PACU Anesthesia Type: MAC Level of consciousness: awake and alert Pain management: pain level controlled Vital Signs Assessment: post-procedure vital signs reviewed and stable Respiratory status: spontaneous breathing, nonlabored ventilation, respiratory function stable and patient connected to nasal cannula oxygen Cardiovascular status: stable and blood pressure returned to baseline Postop Assessment: no apparent nausea or vomiting Anesthetic complications: no   No complications documented.  Last Vitals:  Vitals:   04/19/20 0820 04/19/20 0830  BP: (!) 154/64 (!) 147/63  Pulse: (!) 59 61  Resp: 18 20  Temp:    SpO2: 98% 99%    Last Pain:  Vitals:   04/19/20 0830  TempSrc:   PainSc: 0-No pain                 Barnet Glasgow

## 2020-04-19 NOTE — H&P (Signed)
Kelly Warner is an 64 y.o. female.   Chief Complaint: Colorectal cancer screening. HPI: 64 year old white female here for a screening colonoscopy.  She has severe sleep apnea.  She averages 1 bowel movement per day with no blood or mucus in the stool.  Occasionally she has diarrhea alternating with constipation.  She has a history of lactose and gluten intolerance.  She denies having family history of colon cancer, celiac sprue or IBD.  She had a normal colonoscopy done in June 2011.  Past Medical History:  Diagnosis Date  . GERD (gastroesophageal reflux disease)   . Hypertension   . Migraines   . Stroke [facility])    Past Surgical History:  Procedure Laterality Date  . ABDOMINAL HYSTERECTOMY    . NASAL FRACTURE SURGERY    . SHOULDER ARTHROSCOPY Right   . TONSILLECTOMY     Family History  Problem Relation Age of Onset  . Atrial fibrillation Mother   . Hypertension Mother   . Heart attack Father   . Atrial fibrillation Brother   . Hypertension Brother    Social History:  reports that she has never smoked. She has never used smokeless tobacco. She reports current alcohol use. She reports that she does not use drugs.  Allergies:  Allergies  Allergen Reactions  . Lactose Intolerance (Gi) Other (See Comments)    GI upset   Medications Prior to Admission  Medication Sig Dispense Refill  . aspirin EC 81 MG tablet Take 81 mg by mouth at bedtime.     Marland Kitchen atenolol (TENORMIN) 25 MG tablet Take 12.5 mg by mouth at bedtime.   0  . atorvastatin (LIPITOR) 20 MG tablet Take 20 mg by mouth at bedtime.   3  . Calcium Carbonate-Vitamin D (CALCIUM-VITAMIN D3 PO) Take 1,200 mg by mouth daily.    . citalopram (CELEXA) 20 MG tablet Take 20 mg by mouth at bedtime.     . lansoprazole (PREVACID SOLUTAB) 15 MG disintegrating tablet Take 15 mg by mouth daily at 12 noon.    Marland Kitchen levothyroxine (SYNTHROID) 25 MCG tablet Take 25 mcg by mouth daily.    Marland Kitchen loratadine (CLARITIN) 10 MG tablet Take 10 mg by mouth  daily as needed for allergies.    . mometasone (NASONEX) 50 MCG/ACT nasal spray Place 2 sprays into the nose daily as needed (congestion).     Review of Systems  Constitutional: Negative.   HENT: Negative.   Eyes: Negative.   Respiratory: Negative.   Gastrointestinal: Positive for constipation and diarrhea.  Endocrine: Negative.   Genitourinary: Negative.   Musculoskeletal: Negative.   Skin: Negative.   Allergic/Immunologic: Positive for food allergies. Negative for environmental allergies and immunocompromised state.  Neurological: Positive for headaches. Negative for dizziness, tremors, seizures, syncope, facial asymmetry, speech difficulty, weakness, light-headedness and numbness.  Psychiatric/Behavioral: Negative.    There were no vitals taken for this visit. Physical Exam Vitals reviewed.  Constitutional:      Appearance: Normal appearance.  HENT:     Head: Normocephalic and atraumatic.  Cardiovascular:     Rate and Rhythm: Normal rate and regular rhythm.  Pulmonary:     Effort: Pulmonary effort is normal.     Breath sounds: Normal breath sounds.  Abdominal:     General: Abdomen is flat.     Palpations: Abdomen is soft.  Musculoskeletal:        General: Normal range of motion.     Cervical back: Normal range of motion and neck supple.  Skin:    General: Skin is warm and dry.  Neurological:     General: No focal deficit present.     Mental Status: She is alert and oriented to person, place, and time.  Psychiatric:        Mood and Affect: Mood normal.        Behavior: Behavior normal.    Assessment/Plan Colorectal cancer screening-proceed with a colonoscopy at this time.  Juanita Craver, MD 04/19/2020, 7:08 AM

## 2020-04-19 NOTE — Op Note (Signed)
[facility] Patient Name: Kelly Warner Fake Procedure Date: 04/19/2020 MRN: [id number] Attending MD: Juanita Craver , MD Date of Birth: September 27, 1955 CSN: [id number] Age: 64 Admit Type: Outpatient Procedure:                Colonoscopy with cold snare polypectomy x 4. Indications:              CRC screening for colorectal malignant neoplasm. Providers:                Juanita Craver, MD, Cleda Daub, RN, Cherylynn Ridges,                            Technician, Cletis Athens, Technician, Danley Danker,                            CRNA Referring MD:             Aletha Halim PA-C Medicines:                Monitored Anesthesia Care. Complications:            No immediate complications. Estimated Blood Loss:     Estimated blood loss was minimal. Procedure:                Pre-Anesthesia Assessment: - Prior to the                            procedure, a history and physical was performed,                            and patient medications and allergies were                            reviewed. The patient's tolerance of previous                            anesthesia was also reviewed. The risks and                            benefits of the procedure and the sedation options                            and risks were discussed with the patient. All                            questions were answered, and informed consent was                            obtained. Prior Anticoagulants: The patient has                            taken no previous anticoagulant or antiplatelet                            agents except for NSAID medication. ASA Grade  Assessment: II - A patient with mild systemic                            disease. After reviewing the risks and benefits,                            the patient was deemed in satisfactory condition to                            undergo the procedure. After obtaining informed                            consent, the  colonoscope was passed under direct                            vision. Throughout the procedure, the patient's                            blood pressure, pulse, and oxygen saturations were                            monitored continuously. The CF-HQ190L ([id number])                            Olympus colonoscope was introduced through the anus                            and advanced to the the terminal ileum, with                            identification of the appendiceal orifice and IC                            valve. The colonoscopy was performed without                            difficulty. The patient tolerated the procedure                            well. The quality of the bowel preparation was                            adequate. The terminal ileum, the ileocecal valve,                            the appendiceal orifice and the rectum were                            photographed. The bowel preparation used was                            Miralax via split dose instruction. Scope In: 7:00:41 AM Scope Out: 7:48:38 AM Scope Withdrawal Time: 0 hours 13 minutes 21 seconds  Total  Procedure Duration: 0 hours 47 minutes 57 seconds  Findings:      Four 7-8 mm sessile polyps were found, 1 in the distal transverse colon       and 3 in the proximal ascending colon; these polyps were removed with a       cold snare x 4; resection and retrieval were complete.      The terminal ileum appeared normal.      The exam was otherwise without abnormality on direct and retroflexion       views. Impression:               - Four 7-8 mm sessile polyps wetre found, 1 in the                            distal transverse colon and 3 in the proximal                            ascending colon, removed with a cold snare x 4;                            resected and retrieved.                           - The examined portion of the ileum was normal.                           - The examination was otherwise normal  on direct                            and retroflexion views. Moderate Sedation:      MAC used. Recommendation:           - High fiber diet with augmented water consumption                            daily.                           - Continue present medications.                           - Await pathology results.                           - Repeat colonoscopy in 5-10 years for                            surveillance, depending on the pathology results.                           - Return to GI office PRN.                           - If the patient has any abnormal GI symptoms in                            the interim,  she has been advised to call the                            office ASAP for further recommendations. Procedure Code(s):        --- Professional ---                           [phone], Colonoscopy, flexible; with removal of                            tumor(s), polyp(s), or other lesion(s) by snare                            technique Diagnosis Code(s):        --- Professional ---                           Z12.11, Encounter for screening for malignant                            neoplasm of colon                           K63.5, Polyp of colon CPT copyright 2019 [employer]. All rights reserved. The codes documented in this report are preliminary and upon coder review may  be revised to meet current compliance requirements. Juanita Craver, MD Juanita Craver, MD 04/19/2020 8:01:14 AM This report has been signed electronically. Number of Addenda: 0

## 2020-04-19 NOTE — Discharge Instructions (Signed)

## 2020-04-20 ENCOUNTER — Encounter (HOSPITAL_COMMUNITY): Payer: Self-pay | Admitting: Gastroenterology

## 2020-04-20 LAB — SURGICAL PATHOLOGY

## 2020-05-20 IMAGING — RF DG ESOPHAGUS
8 series · 14 of 24 positions shown · non-contrast
Comparison: None.

CLINICAL DATA: Dysphagia

EXAM:
ESOPHOGRAM / BARIUM SWALLOW / BARIUM TABLET STUDY
TECHNIQUE: Combined double contrast and single contrast examination performed
using effervescent crystals, thick barium liquid, and thin barium
liquid. The patient was observed with fluoroscopy swallowing a 13 mm
barium sulphate tablet.
FLUOROSCOPY TIME:  Fluoroscopy Time:  1 minutes 12 second
Radiation Exposure Index (if provided by the fluoroscopic device):
Number of Acquired Spot Images: 0

[Series 1: sequence · 0.28mm/px · 2 of 17 frames shown (1 of 7)]
[frame 3/17]
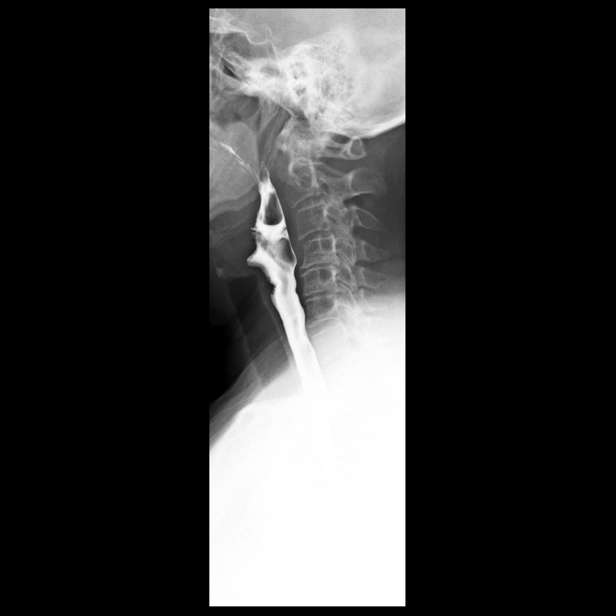
[frame 15/17]
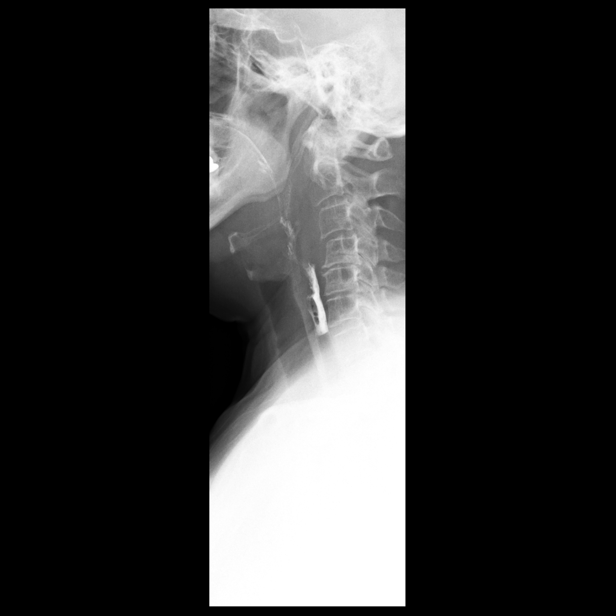

[Series 2: sequence · 0.28mm/px · 1 of 11 frames shown (2 of 7)]
[frame 10/11]
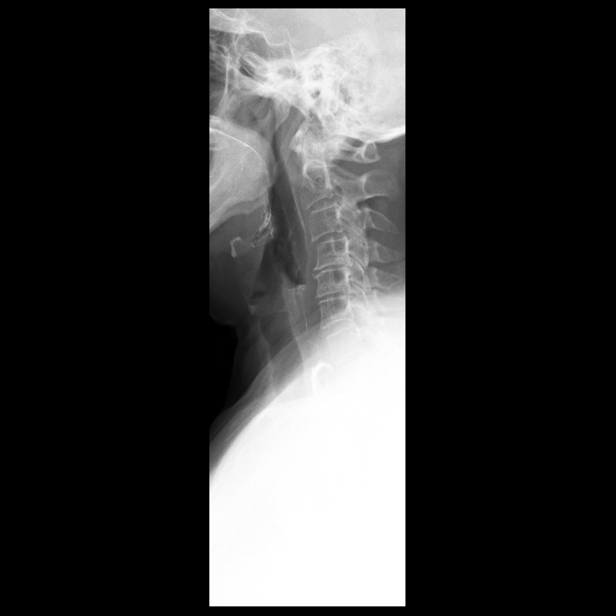

[Series 3: sequence · 0.28mm/px · 2 of 19 frames shown (3 of 7)]
[frame 3/19]
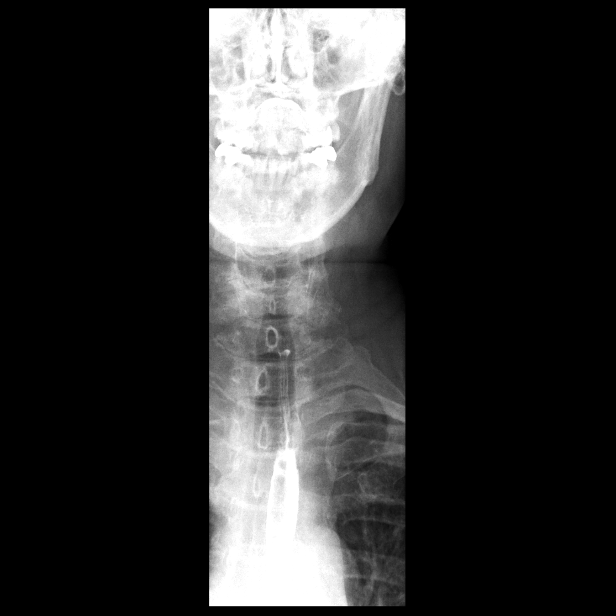
[frame 17/19]
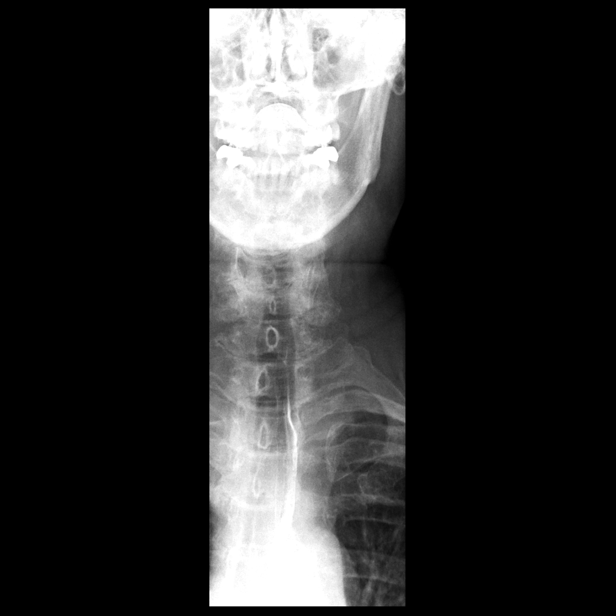

[Series 4: sequence · 0.28mm/px · 2 of 40 frames shown (4 of 7)]
[frame 7/40]
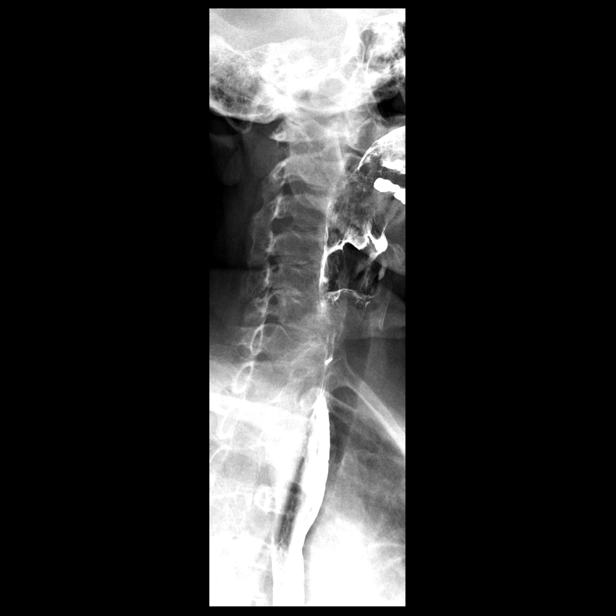
[frame 35/40]
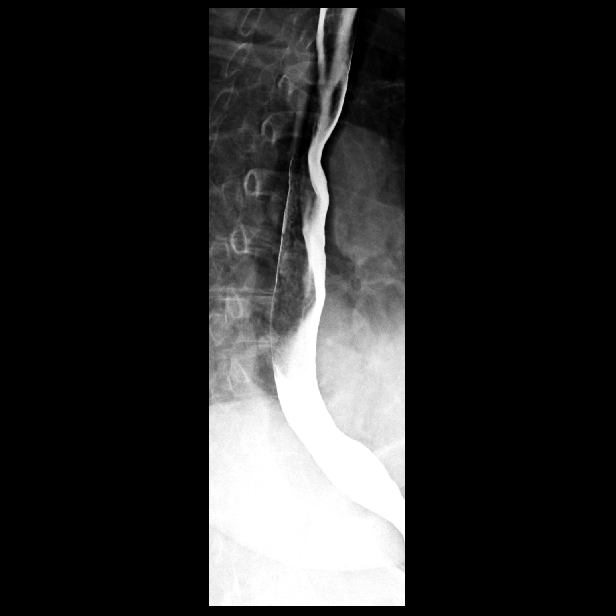

[Series 5: sequence · 0.28mm/px · 2 of 22 frames shown (5 of 7)]
[frame 12/22]
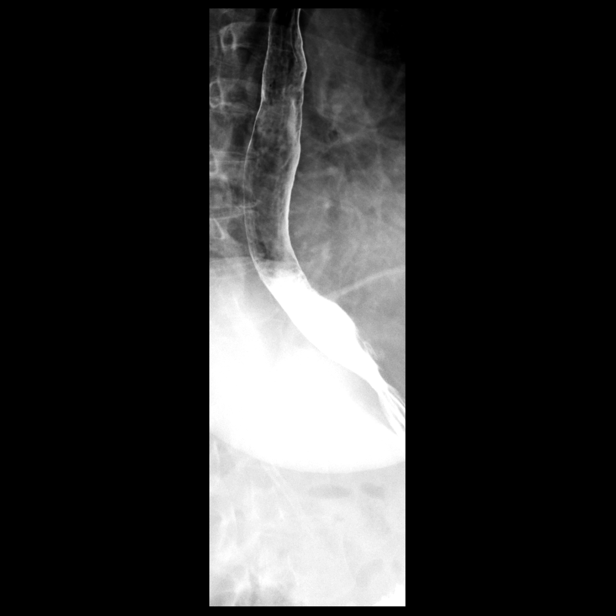
[frame 19/22]
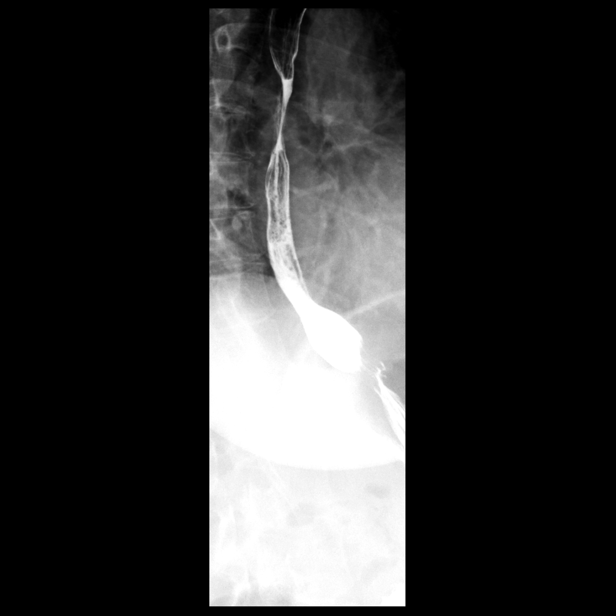

[Series 6: sequence · 0.28mm/px · 1 of 40 frames shown (6 of 7)]
[frame 7/40]
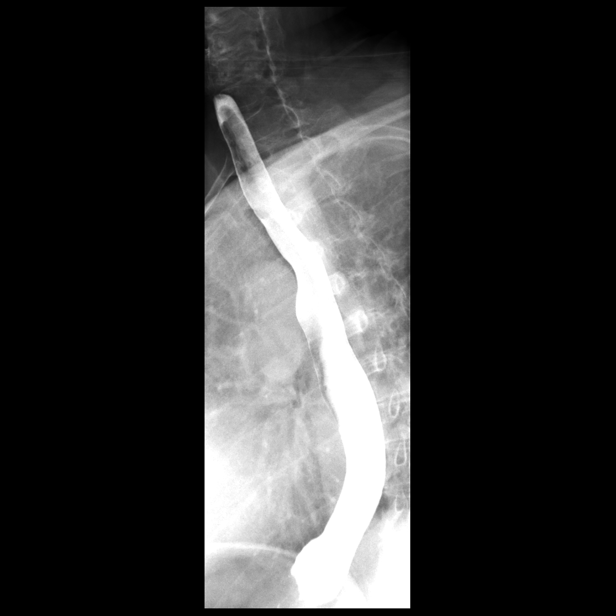

[Series 7: sequence · 0.28mm/px · 2 of 13 frames shown (7 of 7)]
[frame 1/13]
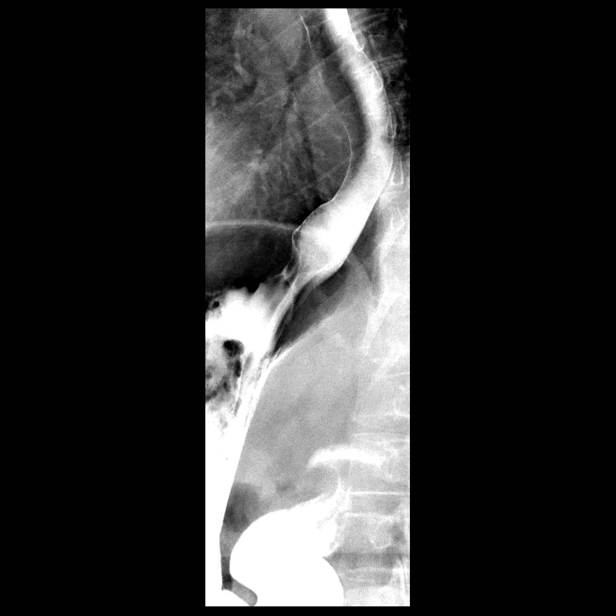
[frame 2/13]
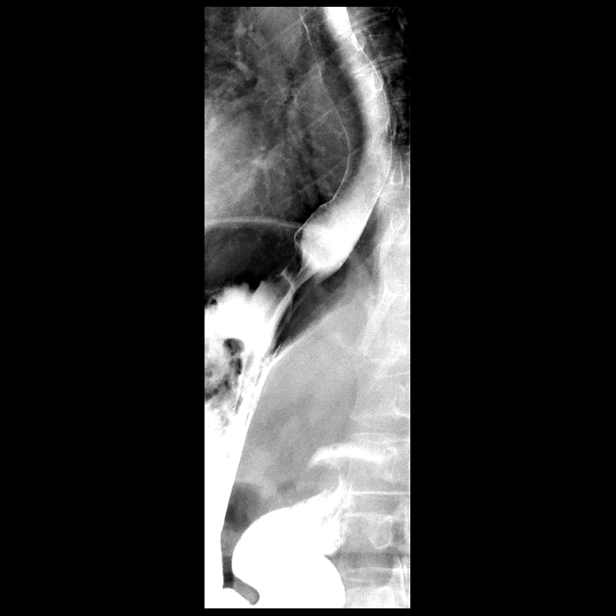

[Series 8: one shot · 2 of 3 slices shown]
[im 1/3]
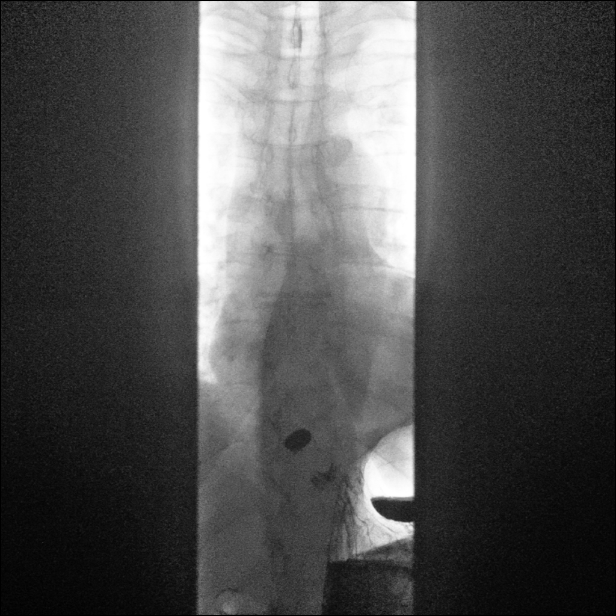
[im 3/3]
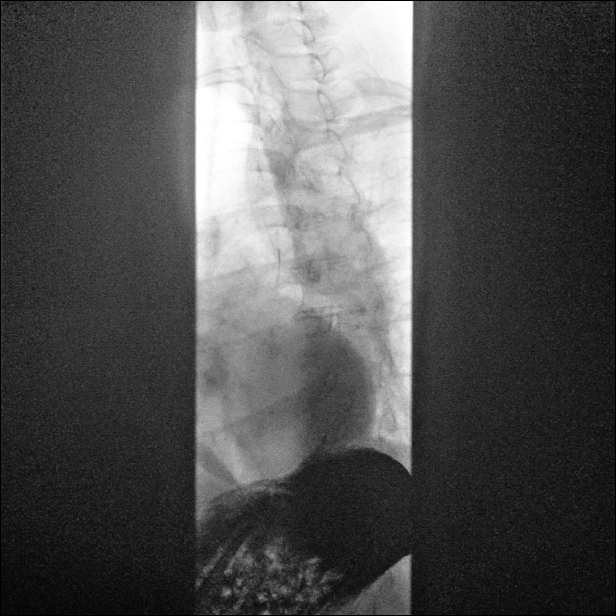

[14 of 24 positions shown; findings below may reference images not displayed]

FINDINGS: Pharyngeal phase of swallowing was evaluated and AP and lateral
projections and appears normal. No diverticulum stricture or mass.
No aspiration

Esophageal motility normal. Negative for stricture or mass in the
esophagus.

Negative for hiatal hernia or reflux. Barium tablet passed readily
into the stomach without delay
IMPRESSION: Negative

## 2021-04-26 ENCOUNTER — Other Ambulatory Visit: Payer: Self-pay

## 2021-04-26 ENCOUNTER — Ambulatory Visit: Payer: Medicare HMO | Admitting: Cardiology

## 2021-04-26 ENCOUNTER — Encounter: Payer: Self-pay | Admitting: Cardiology

## 2021-04-26 ENCOUNTER — Telehealth: Payer: Self-pay | Admitting: Pulmonary Disease

## 2021-04-26 VITALS — BP 111/65 | HR 62 | Ht 64.0 in | Wt 178.0 lb

## 2021-04-26 DIAGNOSIS — R739 Hyperglycemia, unspecified: Secondary | ICD-10-CM

## 2021-04-26 DIAGNOSIS — Z8673 Personal history of transient ischemic attack (TIA), and cerebral infarction without residual deficits: Secondary | ICD-10-CM

## 2021-04-26 DIAGNOSIS — I10 Essential (primary) hypertension: Secondary | ICD-10-CM

## 2021-04-26 DIAGNOSIS — E7800 Pure hypercholesterolemia, unspecified: Secondary | ICD-10-CM

## 2021-04-26 DIAGNOSIS — I214 Non-ST elevation (NSTEMI) myocardial infarction: Secondary | ICD-10-CM

## 2021-04-26 NOTE — Progress Notes (Signed)
Primary Physician/Referring:  Aletha Halim., PA-C  Patient ID: Kelly Warner, female    DOB: [birth year], 65 y.o.   MRN: [id number]  Chief Complaint  Patient presents with   NSTEMI   Hospitalization Follow-up   New Patient (Initial Visit)   HPI:    Kelly Warner  is a 65 y.o. Caucasian female patient with hypertension, hyperlipidemia, hyperglycemia, history of ischemic stroke in 2018, hypothyroidism and GERD and migraine headaches, obstructive sleep apnea on CPAP presented to [facility] on 04/18/2021 with chest pain.  Cardiac catheterization revealing small vessel disease, recommended medical therapy.  She now presents to establish care with me.  She is presently asymptomatic but is concerned about having had stroke in 2018 and now NSTEMI.  She is accompanied by her husband and brings records from prior hospitalization and labs.  Past Medical History:  Diagnosis Date   GERD (gastroesophageal reflux disease)    Hypertension    Migraines    NSTEMI (non-ST elevation myocardial infarction) ([place])    Stroke [facility]    Past Surgical History:  Procedure Laterality Date   ABDOMINAL HYSTERECTOMY     COLONOSCOPY WITH PROPOFOL N/A 04/19/2020   Procedure: COLONOSCOPY WITH PROPOFOL;  Surgeon: Juanita Craver, MD;  Location: [facility] ENDOSCOPY;  Service: Endoscopy;  Laterality: N/A;   NASAL FRACTURE SURGERY     POLYPECTOMY  04/19/2020   Procedure: POLYPECTOMY;  Surgeon: Juanita Craver, MD;  Location: [facility] ENDOSCOPY;  Service: Endoscopy;;   SHOULDER ARTHROSCOPY Right    TONSILLECTOMY     Family History  Problem Relation Age of Onset   Atrial fibrillation Mother 110   Hypertension Mother    Heart attack Father 11   Atrial fibrillation Sister 4   Atrial fibrillation Brother 55   Hypertension Brother     Social History   Tobacco Use   Smoking status: Never   Smokeless tobacco: Never  Substance Use Topics   Alcohol use: Yes    Comment: occ   Marital Status: Married  ROS  Review  of Systems  Cardiovascular:  Negative for chest pain, dyspnea on exertion and leg swelling.  Gastrointestinal:  Negative for melena.  Objective  Blood pressure 111/65, pulse 62, height $RemoveBe'5\' 4"'DZwGoPhHF$  (1.626 m), weight 178 lb (80.7 kg), SpO2 96 %. Body mass index is 30.55 kg/m.  Vitals with BMI 04/26/2021 04/19/2020 04/19/2020  Height $Remov'5\' 4"'AUwsNN$  - -  Weight 178 lbs - -  BMI 63.84 - -  Systolic [phone]  Diastolic 65 63 64  Pulse 62 61 59     Physical Exam Neck:     Vascular: No carotid bruit or JVD.  Cardiovascular:     Rate and Rhythm: Normal rate and regular rhythm.     Pulses: Intact distal pulses.     Heart sounds: Normal heart sounds. No murmur heard.   No gallop.  Pulmonary:     Effort: Pulmonary effort is normal.     Breath sounds: Normal breath sounds.  Abdominal:     General: Bowel sounds are normal.     Palpations: Abdomen is soft.  Musculoskeletal:        General: No swelling.     Laboratory examination:   No results for input(s): NA, K, CL, CO2, GLUCOSE, BUN, CREATININE, CALCIUM, GFRNONAA, GFRAA in the last 8760 hours. CrCl cannot be calculated (Patient's most recent lab result is older than the maximum 21 days allowed.).  CMP Latest Ref Rng & Units 03/15/2017 03/15/2017 04/09/2009  Glucose 65 -  99 mg/dL 114(H) 116(H) 90  BUN 6 - 20 mg/dL $Remove'16 14 10  'HbDcihz$ Creatinine 0.44 - 1.00 mg/dL 0.70 0.82 0.7  Sodium 135 - 145 mmol/L 140 138 138  Potassium 3.5 - 5.1 mmol/L 3.9 3.9 4.0  Chloride 101 - 111 mmol/L 103 105 104  CO2 22 - 32 mmol/L - 25 -  Calcium 8.9 - 10.3 mg/dL - 9.1 -  Total Protein 6.5 - 8.1 g/dL - 6.9 -  Total Bilirubin 0.3 - 1.2 mg/dL - 0.8 -  Alkaline Phos 38 - 126 U/L - 123 -  AST 15 - 41 U/L - 20 -  ALT 14 - 54 U/L - 17 -   CBC Latest Ref Rng & Units 03/15/2017 03/15/2017 04/09/2009  WBC 4.0 - 10.5 K/uL - 6.0 -  Hemoglobin 12.0 - 15.0 g/dL 15.0 14.5 13.9  Hematocrit 36.0 - 46.0 % 44.0 43.9 41.0  Platelets 150 - 400 K/uL - 288 -    Lipid Panel No results for input(s):  CHOL, TRIG, LDLCALC, VLDL, HDL, CHOLHDL, LDLDIRECT in the last 8760 hours. Lipid Panel     Component Value Date/Time   CHOL 177 03/16/2017 0307   TRIG 84 03/16/2017 0307   HDL 55 03/16/2017 0307   CHOLHDL 3.2 03/16/2017 0307   VLDL 17 03/16/2017 0307   LDLCALC 105 (H) 03/16/2017 0307     HEMOGLOBIN A1C Lab Results  Component Value Date   HGBA1C 5.8 (H) 03/16/2017   MPG 120 03/16/2017   TSH No results for input(s): TSH in the last 8760 hours.  Labs 04/26/2021:  Apo A1 + B + Ratio 0.3 (0.00-0.6). Lipoprotein (a) <75.0 nmol/L : 21.5     External labs:  Labs 04/23/2021:  TSH normal at 3.04.  A1c 5.9%.  Hb 13.5/HCT 40.9, platelets 206, normal indicis.  Serum glucose 138 mg, BUN 19, creatinine 0.78, EGFR >60 mL.  ALT, AST normal, alkaline phosphatase mildly elevated at 144.  Total cholesterol 135, triglycerides 83, HDL 58, LDL 59.  Medications and allergies   Allergies  Allergen Reactions   Lactose Intolerance (Gi) Other (See Comments)    GI upset     Medication prior to this encounter:   Outpatient Medications Prior to Visit  Medication Sig Dispense Refill   aspirin EC 81 MG tablet Take 81 mg by mouth at bedtime.      atenolol (TENORMIN) 25 MG tablet Take 25 mg by mouth daily.     atorvastatin (LIPITOR) 80 MG tablet Take 80 mg by mouth daily.     Calcium Carbonate-Vitamin D (CALCIUM-VITAMIN D3 PO) Take 1,200 mg by mouth daily.     citalopram (CELEXA) 20 MG tablet Take 20 mg by mouth at bedtime.      clopidogrel (PLAVIX) 75 MG tablet Take 75 mg by mouth daily.     dexlansoprazole (DEXILANT) 60 MG capsule Take 60 mg by mouth daily.     levothyroxine (SYNTHROID) 25 MCG tablet Take 25 mcg by mouth daily.     lisinopril (ZESTRIL) 5 MG tablet Take 2.5 mg by mouth daily.     mometasone (NASONEX) 50 MCG/ACT nasal spray Place 2 sprays into the nose daily as needed (congestion).     montelukast (SINGULAIR) 10 MG tablet Take 10 mg by mouth at bedtime.     atenolol  (TENORMIN) 25 MG tablet Take 12.5 mg by mouth at bedtime.   0   atorvastatin (LIPITOR) 20 MG tablet Take 20 mg by mouth at bedtime.   3   lansoprazole (  PREVACID SOLUTAB) 15 MG disintegrating tablet Take 15 mg by mouth daily at 12 noon.     loratadine (CLARITIN) 10 MG tablet Take 10 mg by mouth daily as needed for allergies.     No facility-administered medications prior to visit.     Medication list after today's encounter   Current Outpatient Medications  Medication Instructions   aspirin EC 81 mg, Oral, Daily at bedtime   atenolol (TENORMIN) 25 mg, Oral, Daily   atorvastatin (LIPITOR) 80 mg, Oral, Daily   Calcium Carbonate-Vitamin D (CALCIUM-VITAMIN D3 PO) 1,200 mg, Oral, Daily   citalopram (CELEXA) 20 mg, Oral, Daily at bedtime   clopidogrel (PLAVIX) 75 mg, Oral, Daily   dexlansoprazole (DEXILANT) 60 mg, Oral, Daily   levothyroxine (SYNTHROID) 25 mcg, Oral, Daily   lisinopril (ZESTRIL) 2.5 mg, Oral, Daily   mometasone (NASONEX) 50 MCG/ACT nasal spray 2 sprays, Nasal, Daily PRN   montelukast (SINGULAIR) 10 mg, Oral, Daily at bedtime    Radiology:   No results found.  Cardiac Studies:    Holter monitor 03/31/2017: Normal sinus rhythm. No atrial fibrillation or flutter (artifact noted on tracing) No PVC's or PAC's Reported palpiations correlate with normal rhythm. Reassuring monitor  Coronary Angiogram 04/19/2021 The left ventricular ejection fraction: 0.6. LV Pressure: 90; edp 22 mmHg. Aortic Pressure: 90/64 The left main coronary artery was large in caliber and bifurcated into the LAD and LCX with no angiographic coronary artery disease.  The LAD gave rise to 5 small diagonals across the anterolateral wall with small caliber high D1 with diffuse small vessel disease.  The LCX gave rise to 2 OM branches across the lateral wall with small caliber high OM1 with diffuse small vessel disease.  The RCA was a dominant artery and supplied both the PDA and PLB with distal RCA  small vessel disease with faint left to right collaterals.   Impression: Widely patent large epicardial coronary arteries with small vessel disease in small caliber OM1 and small caliber D1 and distal RCA small vessel disease for medical therapy. Normal LVEF. Elevated left heart pressures. No Aortic valve gradient.  Echocardiogram 04/18/2021:   1. Normal left ventricular cavity size. Mild concentric left ventricular  hypertrophy. Normal left ventricular systolic function. LV Ejection Fraction  is approximately: 65 %.  2. Left ventricular diastolic parameters are consistent with mild (Grade I)  diastolic dysfunction (impaired relaxation).  3. Resting Segmental Wall Motion Analysis: Total wall motion score is 1.00.  There are no regional wall motion abnormalities.  4. Normal right ventricular systolic function.  5. The left atrium is normal in size.  6. Trileaflet aortic valve. Mild aortic valve regurgitation.  7. Normal estimated pulmonary artery systolic pressure.  8. The aortic root is normal in size. The ascending aorta is normal in size.   EKG:   EKG 04/26/2021: Normal sinus rhythm at rate of 59 bpm, leftward axis, incomplete right bundle branch block.  No evidence of ischemia.    Assessment     ICD-10-CM   1. Non-ST elevation (NSTEMI) myocardial infarction (HCC)  I21.4 EKG 12-Lead    AMB referral to cardiac rehabilitation    2. H/O arterial ischemic stroke Right coronara radiata 03/15/17  Z86.73     3. Pure hypercholesterolemia  E78.00 Lipoprotein A (LPA)    Apo A1 + B + Ratio    4. Primary hypertension  I10     5. Hyperglycemia  R73.9       Labs 04/26/2021:  Apo A1 + B + Ratio  0.3 (0.00-0.6). Lipoprotein (a) <75.0 nmol/L : 21.5     No orders of the defined types were placed in this encounter.  Orders Placed This Encounter  Procedures   Lipoprotein A (LPA)   Apo A1 + B + Ratio   AMB referral to cardiac rehabilitation    Referral Priority:   Routine    Referral  Type:   Consultation    Number of Visits Requested:   1   EKG 12-Lead   Recommendations:   ANALENA GAMA is a 65 y.o. Caucasian female patient with hypertension, hyperlipidemia, hyperglycemia, history of ischemic stroke in 2018, hypothyroidism and GERD and migraine headaches, obstructive sleep apnea on CPAP presented to [facility] on 04/18/2021 with chest pain.  Cardiac catheterization revealing small vessel disease, recommended medical therapy.  She now presents to establish care with me.  I have reviewed all her medical records, also performed advanced lipid profile testing with LPA and ApoA-I plus B ratio's, within normal limits.  She is now developing significant amount of myalgias.  Advised her to continue high intensity statins for now for at least a total of 4 weeks prior to reducing to 20 mg daily.  Since being on statin, her LDL has reduced to 59.  She does have hyperglycemia, weight loss was discussed.  Blood pressure is normal, she is on appropriate medical therapy, advised her to continue the same for now.  In the absence of any other signals with regard to hyperlipidemia, continue observation for now and continue dual antiplatelet therapy for at least a period of 1 year.  Suspect she may have had plaque rupture or significant coronary spasm leading to an event.  I reviewed all her medical records from the hospitalization, prior records during her TIA/stroke as well.  Surprisingly she did not have a TEE at that time.  Fortunately she has not had any recurrence of stroke.  This was a >60-minute office visit encounter consultation.   Adrian Prows, MD, [facility] 04/30/2021, 6:11 PM Office: [phone]

## 2021-04-27 LAB — APO A1 + B + RATIO
Apolipo. B/A-1 Ratio: 0.3 ratio (ref 0.0–0.6)
Apolipoprotein A-1: 136 mg/dL (ref 116–209)
Apolipoprotein B: 46 mg/dL (ref <90)

## 2021-04-27 LAB — LIPOPROTEIN A (LPA): Lipoprotein (a): 21.5 nmol/L (ref <75.0)

## 2021-04-29 NOTE — Telephone Encounter (Signed)
Spoke to patient, who is requesting a copy of sleep study results to review prior to seeing OA on 05/22/2021. Results have been placed in outgoing mail. Nothing further needed at this time.

## 2021-05-01 ENCOUNTER — Telehealth: Payer: Self-pay

## 2021-05-01 ENCOUNTER — Telehealth (HOSPITAL_COMMUNITY): Payer: Self-pay

## 2021-05-01 NOTE — Telephone Encounter (Signed)
Pt called and stated that she tried to make an appt with cardiac rehab but they do not have any appointments until November. Pt would like to know what she can do in the mean time.

## 2021-05-01 NOTE — Telephone Encounter (Signed)
Called and spoke to pt, pt voiced understanding.

## 2021-05-01 NOTE — Telephone Encounter (Signed)
She can slowly increase her physical activity at home. Start by walking for short periods of time a couple times per day and work her way up, increasing the period of time she is walking.

## 2021-05-01 NOTE — Telephone Encounter (Signed)
Pt called wanting to know more about the cardiac rehab program and wanting to see about getting scheduled, I advised pt that once her office notes have been reviewed we will give her a call at a later date to scheduled. I also advised pt of the 1-3 month backlog.

## 2021-05-03 NOTE — Telephone Encounter (Signed)
Pt insurance is active and benefits verified through [facility]. Co-pay $10.00, DED $0.00/$0.00 met, out of pocket $3,900.00/$331.84 met, co-insurance 0%. No pre-authorization required. Passport, 05/03/21 @ 9:48AM, [facility]#[id number]   Will contact patient to see if she is interested in the Cardiac Rehab Program.

## 2021-05-03 NOTE — Telephone Encounter (Signed)
Called patient to see if she is interested in the Cardiac Rehab Program. Patient expressed interest. Explained scheduling process and went over insurance, patient verbalized understanding. Adv of backlog of 1-3 months.

## 2021-05-07 ENCOUNTER — Encounter (HOSPITAL_COMMUNITY): Payer: Self-pay | Admitting: *Deleted

## 2021-05-07 NOTE — Progress Notes (Signed)
Received referral from Dr. Einar Gip for this pt to participate in cardiac rehab with the diagnosis of NSTEMI on 8/18 at [facility].  Pt had cath completed on 8/19 which showed widely patent large epicardial coronary arteries with small vessel disease plan to treat medically. Clinical review of pt follow up appt on 8/26 with Dr. Einar Gip - cardiologist office note. Pt is making the expected progress in recovery.  Pt appropriate for scheduling for on site cardiac rehab and/or enrollment in Virtual Cardiac Rehab.  Pt Covid Risk Score is **.  Will forward to staff for follow up. Cherre Huger, BSN Cardiac and Training and development officer

## 2021-05-14 NOTE — Telephone Encounter (Signed)
From pt

## 2021-05-22 ENCOUNTER — Ambulatory Visit: Payer: Medicare HMO | Admitting: Pulmonary Disease

## 2021-05-31 ENCOUNTER — Encounter (HOSPITAL_COMMUNITY): Payer: Self-pay

## 2021-05-31 ENCOUNTER — Telehealth (HOSPITAL_COMMUNITY): Payer: Self-pay

## 2021-05-31 NOTE — Telephone Encounter (Signed)
Attempted to call patient in regards to Cardiac Rehab - LM on VM Mailed letter 

## 2021-06-03 ENCOUNTER — Telehealth (HOSPITAL_COMMUNITY): Payer: Self-pay

## 2021-06-03 NOTE — Telephone Encounter (Signed)
Patient called and was interested in participating in the Cardiac Rehab Program. Patient will come in for orientation on 06/25/2021@8 :30am and will attend the 11:15am exercise class.   Tourist information centre manager.

## 2021-06-04 ENCOUNTER — Other Ambulatory Visit: Payer: Self-pay

## 2021-06-04 ENCOUNTER — Encounter: Payer: Self-pay | Admitting: Pulmonary Disease

## 2021-06-04 ENCOUNTER — Ambulatory Visit: Payer: Medicare HMO | Admitting: Pulmonary Disease

## 2021-06-04 VITALS — BP 120/72 | HR 68 | Temp 98.4°F | Ht 64.0 in | Wt 176.2 lb

## 2021-06-04 DIAGNOSIS — G4733 Obstructive sleep apnea (adult) (pediatric): Secondary | ICD-10-CM

## 2021-06-04 MED ORDER — AZELASTINE-FLUTICASONE 137-50 MCG/ACT NA SUSP: 1.0000 | Freq: Every day | NASAL | 2 refills | Status: DC

## 2021-06-04 NOTE — Addendum Note (Signed)
Addended by: Darliss Ridgel on: 06/04/2021 03:24 PM   Modules accepted: Orders

## 2021-06-04 NOTE — Patient Instructions (Signed)
Repeat home sleep study  Continue using CPAP on a nightly basis  DME referral for CPAP supplies  Dymista prescription sent into pharmacy -If not covered -Nasonex, Flonase, examples of nasal steroids that you may try  Continue graded exercises as tolerated  I will see you back in 3 months  Call with significant concerns

## 2021-06-04 NOTE — Progress Notes (Signed)
Kelly Warner    [id number]    07-07-1956  Primary Care Physician:Kaplan, Baldemar Friday., PA-C  Referring Physician: Aletha Halim., PA-C [address]  [place] [id number]  Chief complaint:    Follow-up for obstructive sleep apnea   HPI:  Recent coronary event Following up with cardiology  Still chronically used to using CPAP on a nightly basis Trying to improve her compliance  Still has issues with the mask, issues with the pressure Other options of treatment were discussed Significance of managing risk for coronary events discussed Risk of cardiovascular disease with untreated sleep disordered breathing discussed  Inspire device discussed  She has recently managed to lose over 20 pounds and she is wondering whether the severity of sleep disordered breathing has changed  She does have nasal stuffiness and congestion -Discussed about nasal rinses and nasal sprays to help  History significant for History of nonrestorative sleep Has been noted to stop breathing, snorting respirations, headaches  Usually goes to bed about 10-11 PM, takes about 10 minutes to fall asleep, wakes up about 7 AM  Her weight has been relatively stable Admits to headaches  History of allergies, history of allergy to mold and dust  Pets: Dog Occupation: No pertinent occupational history Exposures: Significant exposures Smoking history: Non-smoker  Outpatient Encounter Medications as of 06/04/2021  Medication Sig   aspirin EC 81 MG tablet Take 81 mg by mouth at bedtime.    atenolol (TENORMIN) 25 MG tablet Take 25 mg by mouth daily. Patient taking differently at 12.5   atorvastatin (LIPITOR) 80 MG tablet Take 80 mg by mouth daily. Pt is taking 20mg    Calcium Carbonate-Vitamin D (CALCIUM-VITAMIN D3 PO) Take 1,200 mg by mouth daily.   citalopram (CELEXA) 20 MG tablet Take 20 mg by mouth at bedtime.    clopidogrel (PLAVIX) 75 MG tablet Take 75 mg by mouth daily.    dexlansoprazole (DEXILANT) 60 MG capsule Take 60 mg by mouth daily.   levothyroxine (SYNTHROID) 25 MCG tablet Take 25 mcg by mouth daily.   mometasone (NASONEX) 50 MCG/ACT nasal spray Place 2 sprays into the nose daily as needed (congestion).   montelukast (SINGULAIR) 10 MG tablet Take 10 mg by mouth at bedtime.   lisinopril (ZESTRIL) 5 MG tablet Take 2.5 mg by mouth daily. (Patient not taking: Reported on 06/04/2021)   No facility-administered encounter medications on file as of 06/04/2021.    Allergies as of 06/04/2021 - Review Complete 06/04/2021  Allergen Reaction Noted   Lactose intolerance (gi) Other (See Comments) 04/09/2020    Past Medical History:  Diagnosis Date   GERD (gastroesophageal reflux disease)    Hypertension    Migraines    NSTEMI (non-ST elevation myocardial infarction) ([place])    Stroke [facility])     Past Surgical History:  Procedure Laterality Date   ABDOMINAL HYSTERECTOMY     COLONOSCOPY WITH PROPOFOL N/A 04/19/2020   Procedure: COLONOSCOPY WITH PROPOFOL;  Surgeon: Juanita Craver, MD;  Location: [facility];  Service: Endoscopy;  Laterality: N/A;   NASAL FRACTURE SURGERY     POLYPECTOMY  04/19/2020   Procedure: POLYPECTOMY;  Surgeon: Juanita Craver, MD;  Location: [facility];  Service: Endoscopy;;   SHOULDER ARTHROSCOPY Right    TONSILLECTOMY      Family History  Problem Relation Age of Onset   Atrial fibrillation Mother 20   Hypertension Mother    Heart attack Father 62   Atrial fibrillation Sister 15  Atrial fibrillation Brother 62   Hypertension Brother     Social History   Socioeconomic History   Marital status: Married    Spouse name: Shanon Brow   Number of children: 3   Years of education: 4   Highest education level: Not on file  Occupational History    Comment: RN medical records  Tobacco Use   Smoking status: Never   Smokeless tobacco: Never  Vaping Use   Vaping Use: Never used  Substance and Sexual Activity   Alcohol use: Yes     Comment: occ   Drug use: No   Sexual activity: Not on file  Other Topics Concern   Not on file  Social History Narrative   Lives with husband   Caffeine - none   Social Determinants of Health   Financial Resource Strain: Not on file  Food Insecurity: Not on file  Transportation Needs: Not on file  Physical Activity: Not on file  Stress: Not on file  Social Connections: Not on file  Intimate Partner Violence: Not on file    Review of systems:  Does not feel acutely ill No significant musculoskeletal pains or discomfort Denies any chest pains or chest discomfort No significant shortness of breath with activity  Physical Exam:  Vitals:   06/04/21 1016 06/04/21 1018  BP:  120/72  Pulse:  68  Temp: 98.4 F (36.9 C)   SpO2:  97%   Gen:   Middle-age lady does not appear to be in distress HEENT: No significant abnormality Neck:     No JVDs, no adenopathy Lungs:    Clear breath sounds bilaterally CV:         S1-S2 appreciated  Results of the Epworth flowsheet 10/28/2018 04/09/2018  Sitting and reading 0 1  Watching TV 0 1  Sitting, inactive in a public place (e.g. a theatre or a meeting) 0 0  As a passenger in a car for an hour without a break 0 0  Lying down to rest in the afternoon when circumstances permit 3 2  Sitting and talking to someone 0 0  Sitting quietly after a lunch without alcohol 0 0  In a car, while stopped for a few minutes in traffic 0 0  Total score 3 4    Data Reviewed: Marland Kitchen  Compliance data reviewed  63% compliance with CPAP Average use of 7 hours 28 minutes on days used, average of 4 hours 44 minutes overall Machine set between 5 and 11   Assessment:  .  Severe obstructive sleep apnea -Encouraged to continue using CPAP on a regular basis -Mask issues discussed  .  Nonrestorative sleep -Continue CPAP  .  Excessive daytime sleepiness. -Less sleepiness during the day  .  Coronary artery disease  .  Nasal stuffiness and congestion -Trial  with nasal steroids -Nasal rinses  Options of treatment including surgery, inspire device discussed during this visit  Plan/Recommendations: .  Encouraged to continue using CPAP nightly  .  We will continue compliance monitoring  .  Encouraged to get adequate hours of sleep  .  Tentative follow-up in about 3 months  .  I spent 30 minutes dedicated to the care of this patient on the date of this encounter to include previsit review of records, face-to-face time with the patient discussing conditions above, post visit ordering of testing, clinical documentation with electronic health record and communicated necessary findings to members of the patient's care team   Sherrilyn Rist MD [facility] 06/04/2021, 10:20 AM  CC: Aletha Halim., PA-C

## 2021-06-10 NOTE — Telephone Encounter (Signed)
Pt called and stated that she is unable to wear a mask during cardiac rehab and that she will likely pass out so pt called to cancel her cardiac rehab because mask are required in cardiac rehab. Canceled pt cardiac rehab appts and closed referral.

## 2021-06-11 ENCOUNTER — Encounter (HOSPITAL_BASED_OUTPATIENT_CLINIC_OR_DEPARTMENT_OTHER): Payer: Self-pay | Admitting: Physical Therapy

## 2021-06-11 ENCOUNTER — Ambulatory Visit (HOSPITAL_BASED_OUTPATIENT_CLINIC_OR_DEPARTMENT_OTHER): Payer: Medicare HMO | Attending: Family Medicine | Admitting: Physical Therapy

## 2021-06-11 ENCOUNTER — Other Ambulatory Visit: Payer: Self-pay

## 2021-06-11 DIAGNOSIS — M6281 Muscle weakness (generalized): Secondary | ICD-10-CM | POA: Insufficient documentation

## 2021-06-11 DIAGNOSIS — M25552 Pain in left hip: Secondary | ICD-10-CM | POA: Diagnosis present

## 2021-06-11 DIAGNOSIS — M25551 Pain in right hip: Secondary | ICD-10-CM | POA: Diagnosis not present

## 2021-06-11 DIAGNOSIS — R262 Difficulty in walking, not elsewhere classified: Secondary | ICD-10-CM | POA: Insufficient documentation

## 2021-06-11 NOTE — Therapy (Signed)
OUTPATIENT PHYSICAL THERAPY LOWER EXTREMITY EVALUATION   Referring diagnosis? Right and left greater trochanteric bursitis   Treatment diagnosis? (if different than referring diagnosis) m25.551, m25.552, m62.81, r26.2  What was this (referring dx) caused by? []  Surgery []  Fall [x]  Ongoing issue []  Arthritis []  Other: ____________  Laterality: []  Rt []  Lt [x]  Both  Check all possible CPT codes:      []  97110 (Therapeutic Exercise)  []  92507 (SLP Treatment)  []  97112 (Neuro Re-ed)   []  92526 (Swallowing Treatment)   []  29924 (Gait Training)   []  [id number] (Cognitive Training, 1st 15 minutes) []  97140 (Manual Therapy)   []  97130 (Cognitive Training, each add'l 15 minutes)  []  97530 (Therapeutic Activities)  []  Other, List CPT Code ____________    []  97535 (Self Care)       [x]  All codes above (97110 - 97535)  [x]  97012 (Mechanical Traction)  [x]  97014 (E-stim Unattended)  [x]  97032 (E-stim manual)  [x]  97033 (Ionto)  [x]  97035 (Ultrasound)  []  97760 (Orthotic Fit) []  97750 (Physical Performance Training) [x]  [id number] (Aquatic Therapy) []  97034 (Contrast Bath) []  [id number] (Paraffin) []  97597 (Wound Care 1st 20 sq cm) []  97598 (Wound Care each add'l 20 sq cm) []  97016 (Vasopneumatic Device) []  [phone] (Orthotic Training) []  [id number] (Prosthetic Training)   Patient Name: KIMBERLLY NORGARD MRN: [id number] DOB:1956/06/16, 65 y.o., female Today's Date: 06/11/2021   PT End of Session - 06/11/21 1522     Visit Number 1    Number of Visits 16    Date for PT Re-Evaluation 09/09/21    Authorization Type [employer]    PT Start Time 1515    PT Stop Time 1600    PT Time Calculation (min) 45 min    Activity Tolerance Patient tolerated treatment well    Behavior During Therapy WFL for tasks assessed/performed             Past Medical History:  Diagnosis Date   GERD (gastroesophageal reflux disease)    Hypertension    Migraines    NSTEMI (non-ST elevation myocardial  infarction) ([place])    Stroke [facility])    Past Surgical History:  Procedure Laterality Date   ABDOMINAL HYSTERECTOMY     COLONOSCOPY WITH PROPOFOL N/A 04/19/2020   Procedure: COLONOSCOPY WITH PROPOFOL;  Surgeon: Juanita Craver, MD;  Location: [facility];  Service: Endoscopy;  Laterality: N/A;   NASAL FRACTURE SURGERY     POLYPECTOMY  04/19/2020   Procedure: POLYPECTOMY;  Surgeon: Juanita Craver, MD;  [facility];  Service: Endoscopy;;   SHOULDER ARTHROSCOPY Right    TONSILLECTOMY     Patient Active Problem List   Diagnosis Date Noted   History of stroke 11/16/2017   Acute ischemic stroke ([place]) 03/15/2017   Acute recurrent pansinusitis 11/02/2015   Allergic rhinitis 09/20/2015   Hyperlipidemia 09/20/2015   Laryngopharyngeal reflux (LPR) 09/20/2015   Lentigo 09/20/2015   Migraine headache 09/20/2015   Osteopenia 09/20/2015   Other abnormal auditory perceptions, unspecified ear 09/20/2015   Other seborrheic keratosis 09/20/2015   Essential hypertension 03/12/2009   Vitamin D deficiency 09/07/2008    PCP: Aletha Halim., PA-C  REFERRING PROVIDER: Gentry Fitz, MD  REFERRING DIAG: Right and left greater trochanteric bursitis   THERAPY DIAG:  Pain in right hip  Left hip pain  Muscle weakness (generalized)  Difficulty walking  ONSET DATE: July 2022  SUBJECTIVE:   SUBJECTIVE STATEMENT: Pt states she has pain in both hips but pain is primarily into  the R side. Pt cannot think of a specific MOI. Pt states pain has come and gone a few times. She has tried the prednisone and felt improvement since. In the last 2-3 days, the pain has come back. Walking around [employer] today has been making it more painful on the R. Pt describes pain as throbbing. Pt states it does not feel like previous sciatica. Pain does not go below the knee. Pt denies NT. She walks 2-3 miles at least 3-4x without pain during. Has done aquatic classes [facility] at [facility] and felt better afterwards. Pt  states she is able to touch/massage the painful area. Pt has tried self massage and topical creams. Pt states she a little more hesitant when going down stairs and and does feel mild weakness into that hip. Pt denies systemic symptoms, unexplained weight loss, but does endorse night pain that does not respond to movement change. Pt denies BB changes.   PERTINENT HISTORY: Stroke, MI  PAIN:  Are you having pain? Yes VAS scale: 4/10 Pain location: R hip and R low back Pain orientation: Right and Left  PAIN TYPE: throbbing Pain description: intermittent  Aggravating factors: sitting and lying on the painful side, steps/stairs over the summer  Relieving factors: heat, meds   PRECAUTIONS: None  WEIGHT BEARING RESTRICTIONS No  FALLS:  Has patient fallen in last 6 months? No,  LIVING ENVIRONMENT: Lives with: lives with their family Lives in: House/apartment Stairs: Yes;   OCCUPATION: retired   PLOF: Independent  PATIENT GOALS Pt states she would like to figure out what exercises to be doing to help alleviate discomfort.    OBJECTIVE:   DIAGNOSTIC FINDINGS: pt reports X-rays taken with minimal OA noted. "MD did notice bursitis."  PATIENT SURVEYS:  LEFS 55 / 80 = 68.8 %  COGNITION:  Overall cognitive status: Within functional limits for tasks assessed     SENSATION:  Light touch: Appears intact    MUSCLE LENGTH: Positive supine 90/90 HS Positive Piriformis adduction    POSTURE:  WFL for tasks assessed  LE AROM/PROM:  A/PROM Right 06/11/2021 Left 06/11/2021  Hip flexion WFL p! [facility]  Hip extension [facility]  Hip abduction    Hip adduction WFL p! [facility]  Hip internal rotation 25 30  Hip external rotation 45 50   (Blank rows = not tested)  LE MMT:  MMT Right 06/11/2021 Left 06/11/2021  Hip flexion 4/5 p! 4/5 p!  Hip extension 4/5 4/5  Hip abduction 4/5 4/5  Hip adduction 4/5 4/5   (Blank rows = not tested)  LOWER EXTREMITY SPECIAL TESTS:  Hip special  tests: Saralyn Pilar (FABER) test: positive , Trendelenburg test: positive , and Piriformis test: positive   PALPATION:  TTP and active spasm of R gluteas and hip rotators   FUNCTIONAL TESTS:  Squat : decreased depth, decreased hip extension, limited hip ER  GAIT: Distance walked: 10ft Assistive device utilized: None Level of assistance: Complete Independence Comments: bilat Trendelenburg Stairs: decreased eccentric lowering control, significant Trendelenburg    TODAY'S TREATMENT: STM: R hip gluteal   Exercises Supine Bridge - 2 x daily - 7 x weekly - 2 sets - 10 reps - 2 hold Supine Piriformis Stretch with Foot on Ground - 2 x daily - 7 x weekly - 1 sets - 3 reps - 30 hold Clamshell with Resistance - 2 x daily - 7 x weekly - 2 sets - 10 reps - 5 hold    PATIENT EDUCATION:  Education details: MOI, diagnosis,  prognosis, anatomy, exercise progression, DOMS expectations, muscle firing,  envelope of function, HEP, POC Person educated: Patient Education method: Explanation, Demonstration, Tactile cues, Verbal cues, and Handouts Education comprehension: verbalized understanding, returned demonstration, verbal cues required, and tactile cues required   HOME EXERCISE PROGRAM: Access Code: [email or web address] URL: [email or web address] Date: 06/11/2021 Prepared by: Daleen Bo  ASSESSMENT:  CLINICAL IMPRESSION: Patient is a 65 y.o. female who was seen today for physical therapy evaluation and treatment for cc of bilateral hip pain. Pt's s/s appear consistent with GTPS and due to lateral hip weakness. Pt's pain is sensitive and irritable to touch but does response well to resistance exercise. Analgesic effect following exercise. Objective impairments include Abnormal gait, decreased activity tolerance, decreased mobility, difficulty walking, decreased ROM, decreased strength, hypomobility, increased muscle spasms, impaired flexibility, improper body mechanics, and pain. 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 Personal factors including Age, Fitness, Time since onset of injury/illness/exacerbation, and 1-2 comorbidities:    are also affecting patient's functional outcome. Patient will benefit from skilled PT to address above impairments and improve overall function.  REHAB POTENTIAL: Good  CLINICAL DECISION MAKING: Stable/uncomplicated  EVALUATION COMPLEXITY: Low   GOALS:   SHORT TERM GOALS:  STG Name Target Date Goal status  1 Pt will become independent with HEP in order to demonstrate synthesis of PT education.   06/25/2021 INITIAL  2 Pt will report at least 2 pt reduction on VAS scale for pain in order to demonstrate functional improvement with household activity, self care, and ADL.  07/09/2021 INITIAL  3 Pt will have an at least 9 pt improvement in LEFS measure in order to demonstrate MCID improvement in daily function.  07/09/2021 INITIAL  4 Pt will be able to demonstrate gait with level pelvis in order to demonstrate functional improvement in lumbopelvic function for ambulation and house hold duties.  07/09/2021 INITIAL   LONG TERM GOALS:   LTG Name Target Date Goal status  1 Pt  will become independent with final HEP in order to demonstrate synthesis of PT education.  08/06/2021 INITIAL  2 Pt will be able to demonstrate/report ability to walk >20 mins and perform tall step up without pain in order to demonstrate functional improvement and tolerance to exercise and community mobility.  08/06/2021 INITIAL  3 Pt will have an at least 18 pt improvement in LEFS measure in order to demonstrate MCID improvement in daily function.  08/06/2021 INITIAL  4 Pt will be able to demonstrate/report ability to sit/stand/sidelying for extended periods of time without pain in order to demonstrate functional improvement and tolerance to static positioning.   08/06/2021 INITIAL   PLAN: PT  FREQUENCY: 1-2x/week  PT DURATION: 8 weeks  PLANNED INTERVENTIONS: Therapeutic exercises, Therapeutic activity, Neuro Muscular re-education, Balance training, Gait training, Patient/Family education, Joint mobilization, Stair training, Aquatic Therapy, Dry Needling, Electrical stimulation, Spinal mobilization, Cryotherapy, Moist heat, Taping, Vasopneumatic device, Traction, Ultrasound, Ionotophoresis 4mg /ml Dexamethasone, and Manual therapy  PLAN FOR NEXT SESSION: review HEP, increased hip ABD strength, STS with band,   Daleen Bo PT, DPT 06/11/21 6:50 PM

## 2021-06-25 ENCOUNTER — Ambulatory Visit (HOSPITAL_COMMUNITY): Payer: Medicare HMO

## 2021-06-25 ENCOUNTER — Encounter (HOSPITAL_BASED_OUTPATIENT_CLINIC_OR_DEPARTMENT_OTHER): Payer: Medicare HMO | Admitting: Physical Therapy

## 2021-06-26 ENCOUNTER — Ambulatory Visit: Payer: Medicare HMO | Admitting: Cardiology

## 2021-07-01 ENCOUNTER — Ambulatory Visit (HOSPITAL_COMMUNITY): Payer: Medicare HMO

## 2021-07-02 ENCOUNTER — Encounter (HOSPITAL_BASED_OUTPATIENT_CLINIC_OR_DEPARTMENT_OTHER): Payer: Self-pay | Admitting: Physical Therapy

## 2021-07-02 ENCOUNTER — Ambulatory Visit (HOSPITAL_BASED_OUTPATIENT_CLINIC_OR_DEPARTMENT_OTHER): Payer: Medicare HMO | Attending: Family Medicine | Admitting: Physical Therapy

## 2021-07-02 ENCOUNTER — Other Ambulatory Visit: Payer: Self-pay

## 2021-07-02 DIAGNOSIS — M6281 Muscle weakness (generalized): Secondary | ICD-10-CM | POA: Insufficient documentation

## 2021-07-02 DIAGNOSIS — R262 Difficulty in walking, not elsewhere classified: Secondary | ICD-10-CM | POA: Diagnosis present

## 2021-07-02 DIAGNOSIS — M25552 Pain in left hip: Secondary | ICD-10-CM | POA: Insufficient documentation

## 2021-07-02 DIAGNOSIS — M25551 Pain in right hip: Secondary | ICD-10-CM | POA: Diagnosis present

## 2021-07-02 NOTE — Therapy (Addendum)
OUTPATIENT PHYSICAL THERAPY TREATMENT NOTE  PHYSICAL THERAPY DISCHARGE SUMMARY  Visits from Start of Care: 2   Plan: Patient agrees to discharge.  Patient goals were not met. Patient is being discharged due to not returning to therapy.       Patient Name: Kelly Warner MRN: [id number] DOB:April 27, 1956, 65 y.o., female Today's Date: 07/02/2021  PCP: Aletha Halim., PA-C REFERRING PROVIDER: Aletha Halim., PA-C   PT End of Session - 07/02/21 1556     Visit Number 2    Number of Visits 16    Date for PT Re-Evaluation 09/09/21    Authorization Type [employer]    PT Start Time 1600    PT Stop Time 1640    PT Time Calculation (min) 40 min    Activity Tolerance Patient tolerated treatment well    Behavior During Therapy WFL for tasks assessed/performed             Past Medical History:  Diagnosis Date   GERD (gastroesophageal reflux disease)    Hypertension    Migraines    NSTEMI (non-ST elevation myocardial infarction) ([place])    Stroke [facility])    Past Surgical History:  Procedure Laterality Date   ABDOMINAL HYSTERECTOMY     COLONOSCOPY WITH PROPOFOL N/A 04/19/2020   Procedure: COLONOSCOPY WITH PROPOFOL;  Surgeon: Juanita Craver, MD;  Location: [facility];  Service: Endoscopy;  Laterality: N/A;   NASAL FRACTURE SURGERY     POLYPECTOMY  04/19/2020   Procedure: POLYPECTOMY;  Surgeon: Juanita Craver, MD;  Location: [facility];  Service: Endoscopy;;   SHOULDER ARTHROSCOPY Right    TONSILLECTOMY     Patient Active Problem List   Diagnosis Date Noted   History of stroke 11/16/2017   Acute ischemic stroke ([place]) 03/15/2017   Acute recurrent pansinusitis 11/02/2015   Allergic rhinitis 09/20/2015   Hyperlipidemia 09/20/2015   Laryngopharyngeal reflux (LPR) 09/20/2015   Lentigo 09/20/2015   Migraine headache 09/20/2015   Osteopenia 09/20/2015   Other abnormal auditory perceptions, unspecified ear 09/20/2015   Other seborrheic keratosis 09/20/2015    Essential hypertension 03/12/2009   Vitamin D deficiency 09/07/2008    REFERRING DIAG:  Right and left greater trochanteric bursitis   THERAPY DIAG:  Pain in right hip  Left hip pain  Muscle weakness (generalized)  Difficulty walking  PERTINENT HISTORY: Stroke, MI  PRECAUTIONS: N/A  SUBJECTIVE: Pt states she has been out of town for a few weeks. Pt states she is still having pain to the outside of her hips and down the R leg. Pt states that the HEP feels good when doing them but gets pain within 30 mins after.  Pt did walk for "a couple miles" today.   PAIN:  Are you having pain? Yes VAS scale: 3/10 Pain location: R buttock and lateral hip Pain orientation: Right      OBJECTIVE:   DIAGNOSTIC FINDINGS: pt reports X-rays taken with minimal OA noted. "MD did notice bursitis."  SIJ alignment:   TODAY'S TREATMENT: 07/02/2021 TREATMENT: STM: R hip gluteal  Joint mob: R hip ant innominate correction rotation MET with shotgun   PPT 2s 20x Seated QL stretch 30s 2x each Adduction bridge 2x10; pink ball Supine Piriformis Stretch with Foot on Ground - 2 x daily - 7 x weekly - 1 sets - 3 reps - 30 hold Clamshell with Resistance - 2 x daily - 7 x weekly - 2 sets - 10 reps - 5 hold  Self massage tennis ball- R hip  rotators       PATIENT EDUCATION:  Education details: MOI, diagnosis, prognosis, anatomy, exercise progression, DOMS expectations, muscle firing,  envelope of function, HEP, POC Person educated: Patient Education method: Explanation, Demonstration, Tactile cues, Verbal cues, and Handouts Education comprehension: verbalized understanding, returned demonstration, verbal cues required, and tactile cues required     HOME EXERCISE PROGRAM: Access Code: [email or web address] URL: [email or web address] Date: 07/02/2021 Prepared by: Daleen Bo  Exercises Supine Piriformis Stretch with Foot on Ground - 2 x daily - 7 x weekly - 1 sets - 3 reps - 30 hold Clamshell  with Resistance - 2 x daily - 7 x weekly - 2 sets - 10 reps - 5 hold Supine Bridge with Mini Swiss Ball Between Knees - 2 x daily - 7 x weekly - 2 sets - 10 reps Supine Posterior Pelvic Tilt - 2 x daily - 7 x weekly - 1 sets - 20 reps - 2 hold Seated Quadratus Lumborum Stretch in Chair - 2 x daily - 7 x weekly - 1 sets - 3 reps - 30 hold    ASSESSMENT:   CLINICAL IMPRESSION: Pt with increased R hip and L/S soft tissue restriction at today's session. Pt with good response to SIJ MET with decreased pain during gait. Pt HEP updated to include L/S stretching exercise as well as hip stability strenghtening. Plan to continue with assess and treat for SIJ related pain given strong positive response today.   Objective impairments include Abnormal gait, decreased activity tolerance, decreased mobility, difficulty walking, decreased ROM, decreased strength, hypomobility, increased muscle spasms, impaired flexibility, improper body mechanics, and pain. 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 Personal factors including Age, Fitness, Time since onset of injury/illness/exacerbation, and 1-2 comorbidities:    are also affecting patient's functional outcome. Patient will benefit from skilled PT to address above impairments and improve overall function.   REHAB POTENTIAL: Good   CLINICAL DECISION MAKING: Stable/uncomplicated   EVALUATION COMPLEXITY: Low     GOALS:     SHORT TERM GOALS:   STG Name Target Date Goal status  1 Pt will become independent with HEP in order to demonstrate synthesis of PT education.     06/25/2021 INITIAL  2 Pt will report at least 2 pt reduction on VAS scale for pain in order to demonstrate functional improvement with household activity, self care, and ADL.   07/09/2021 INITIAL  3 Pt will have an at least 9 pt improvement in LEFS measure in order to demonstrate MCID improvement in daily  function.   07/09/2021 INITIAL  4 Pt will be able to demonstrate gait with level pelvis in order to demonstrate functional improvement in lumbopelvic function for ambulation and house hold duties.   07/09/2021 INITIAL    LONG TERM GOALS:    LTG Name Target Date Goal status  1 Pt  will become independent with final HEP in order to demonstrate synthesis of PT education.   08/06/2021 INITIAL  2 Pt will be able to demonstrate/report ability to walk >20 mins and perform tall step up without pain in order to demonstrate functional improvement and tolerance to exercise and community mobility.   08/06/2021 INITIAL  3 Pt will have an at least 18 pt improvement in LEFS measure in order to demonstrate MCID improvement in daily function.   08/06/2021 INITIAL  4 Pt will be able to demonstrate/report ability to sit/stand/sidelying for extended periods of time without pain in order  to demonstrate functional improvement and tolerance to static positioning.     08/06/2021 INITIAL    PLAN: PT FREQUENCY: 1-2x/week   PT DURATION: 8 weeks   PLANNED INTERVENTIONS: Therapeutic exercises, Therapeutic activity, Neuro Muscular re-education, Balance training, Gait training, Patient/Family education, Joint mobilization, Stair training, Aquatic Therapy, Dry Needling, Electrical stimulation, Spinal mobilization, Cryotherapy, Moist heat, Taping, Vasopneumatic device, Traction, Ultrasound, Ionotophoresis 20m/ml Dexamethasone, and Manual therapy   PLAN FOR NEXT SESSION: review HEP, increased hip ABD strength, STS with band,   ADaleen BoPT, DPT 07/02/21 4:20 PM

## 2021-07-03 ENCOUNTER — Ambulatory Visit (HOSPITAL_COMMUNITY): Payer: Medicare HMO

## 2021-07-05 ENCOUNTER — Ambulatory Visit (HOSPITAL_COMMUNITY): Payer: Medicare HMO

## 2021-07-08 ENCOUNTER — Ambulatory Visit (HOSPITAL_COMMUNITY): Payer: Medicare HMO

## 2021-07-09 ENCOUNTER — Encounter (HOSPITAL_BASED_OUTPATIENT_CLINIC_OR_DEPARTMENT_OTHER): Payer: Medicare HMO | Admitting: Physical Therapy

## 2021-07-10 ENCOUNTER — Ambulatory Visit (HOSPITAL_COMMUNITY): Payer: Medicare HMO

## 2021-07-12 ENCOUNTER — Ambulatory Visit (HOSPITAL_COMMUNITY): Payer: Medicare HMO

## 2021-07-15 ENCOUNTER — Ambulatory Visit (HOSPITAL_COMMUNITY): Payer: Medicare HMO

## 2021-07-16 ENCOUNTER — Ambulatory Visit (HOSPITAL_BASED_OUTPATIENT_CLINIC_OR_DEPARTMENT_OTHER): Payer: Medicare HMO | Admitting: Physical Therapy

## 2021-07-17 ENCOUNTER — Ambulatory Visit (HOSPITAL_COMMUNITY): Payer: Medicare HMO

## 2021-07-19 ENCOUNTER — Ambulatory Visit (HOSPITAL_COMMUNITY): Payer: Medicare HMO

## 2021-07-22 ENCOUNTER — Telehealth: Payer: Self-pay

## 2021-07-22 ENCOUNTER — Ambulatory Visit (HOSPITAL_COMMUNITY): Payer: Medicare HMO

## 2021-07-22 NOTE — Telephone Encounter (Signed)
Pt called and stated that she had a heart attack in august. She said that the past few days she has been feeling her heart fluttering. She said when she checks her BP it is usually 120/70. Pulse only gets up to 70 bpm. She only takes half of the atenolol because her pulse gets to low when she takes the full 25 mg. She was drinking caffeine but stopped to see if that was causing the symptoms. She said that the symptoms are still occurring. She feels this at random times of the day, she doesn't have to be moving for it to happen. She is unsure of what to this could be, please advise.

## 2021-07-22 NOTE — Telephone Encounter (Signed)
Heart fluttering when she is at rest when the heart rate is low is usually due to PACs and PVCs and benign.  If the symptoms disappear when she starts to be active, it only means that benign and not to be concerned about.  PAC = Premature atrial complexes: These arise from the upper chamber of the heart.  These are very common and are not dangerous.  Extra skipped beat coming from the upper chamber (atrium) and mostly are life altering (nuisance) than life threatening and mostly treated by reassurance.  There may not be any specific reasons for this, however patients with excessive caffeine, anxiety, lack of sleep, alcohol or thyroid problems can have these episodes.

## 2021-07-23 NOTE — Telephone Encounter (Signed)
Called and spoke to pt, pt voiced understanding.

## 2021-07-24 ENCOUNTER — Ambulatory Visit (HOSPITAL_COMMUNITY): Payer: Medicare HMO

## 2021-07-26 ENCOUNTER — Ambulatory Visit (HOSPITAL_COMMUNITY): Payer: Medicare HMO

## 2021-07-29 ENCOUNTER — Ambulatory Visit (HOSPITAL_COMMUNITY): Payer: Medicare HMO

## 2021-07-31 ENCOUNTER — Ambulatory Visit (HOSPITAL_COMMUNITY): Payer: Medicare HMO

## 2021-08-02 ENCOUNTER — Ambulatory Visit (HOSPITAL_COMMUNITY): Payer: Medicare HMO

## 2021-08-05 ENCOUNTER — Ambulatory Visit (HOSPITAL_COMMUNITY): Payer: Medicare HMO

## 2021-08-07 ENCOUNTER — Ambulatory Visit (HOSPITAL_COMMUNITY): Payer: Medicare HMO

## 2021-08-09 ENCOUNTER — Ambulatory Visit (HOSPITAL_COMMUNITY): Payer: Medicare HMO

## 2021-08-12 ENCOUNTER — Ambulatory Visit (HOSPITAL_COMMUNITY): Payer: Medicare HMO

## 2021-08-13 ENCOUNTER — Observation Stay (HOSPITAL_COMMUNITY)
Admission: EM | Admit: 2021-08-13 | Discharge: 2021-08-14 | Disposition: A | Discharge status: Home / Self Care | Payer: Medicare HMO | Attending: Cardiology | Admitting: Cardiology

## 2021-08-13 ENCOUNTER — Emergency Department (HOSPITAL_COMMUNITY): Payer: Medicare HMO

## 2021-08-13 ENCOUNTER — Encounter (HOSPITAL_COMMUNITY): Payer: Self-pay | Admitting: Emergency Medicine

## 2021-08-13 ENCOUNTER — Other Ambulatory Visit: Payer: Self-pay

## 2021-08-13 DIAGNOSIS — E039 Hypothyroidism, unspecified: Secondary | ICD-10-CM | POA: Insufficient documentation

## 2021-08-13 DIAGNOSIS — I200 Unstable angina: Secondary | ICD-10-CM | POA: Diagnosis present

## 2021-08-13 DIAGNOSIS — I10 Essential (primary) hypertension: Secondary | ICD-10-CM | POA: Insufficient documentation

## 2021-08-13 DIAGNOSIS — Z20822 Contact with and (suspected) exposure to covid-19: Secondary | ICD-10-CM | POA: Diagnosis not present

## 2021-08-13 DIAGNOSIS — R079 Chest pain, unspecified: Secondary | ICD-10-CM | POA: Diagnosis present

## 2021-08-13 DIAGNOSIS — Z79899 Other long term (current) drug therapy: Secondary | ICD-10-CM | POA: Insufficient documentation

## 2021-08-13 DIAGNOSIS — I25110 Atherosclerotic heart disease of native coronary artery with unstable angina pectoris: Secondary | ICD-10-CM | POA: Diagnosis not present

## 2021-08-13 DIAGNOSIS — I214 Non-ST elevation (NSTEMI) myocardial infarction: Principal | ICD-10-CM

## 2021-08-13 DIAGNOSIS — E7800 Pure hypercholesterolemia, unspecified: Secondary | ICD-10-CM | POA: Diagnosis not present

## 2021-08-13 LAB — CBC
HCT: 43.1 % (ref 36.0–46.0)
HCT: 40.4 % (ref 36.0–46.0)
Hemoglobin: 14 g/dL (ref 12.0–15.0)
Hemoglobin: 13.1 g/dL (ref 12.0–15.0)
MCH: 30 pg (ref 26.0–34.0)
MCH: 30 pg (ref 26.0–34.0)
MCHC: 32.5 g/dL (ref 30.0–36.0)
MCHC: 32.4 g/dL (ref 30.0–36.0)
MCV: 92.5 fL (ref 80.0–100.0)
MCV: 92.4 fL (ref 80.0–100.0)
Platelets: 266 10*3/uL (ref 150–400)
Platelets: 261 10*3/uL (ref 150–400)
RBC: 4.66 MIL/uL (ref 3.87–5.11)
RBC: 4.37 MIL/uL (ref 3.87–5.11)
RDW: 13.2 % (ref 11.5–15.5)
RDW: 13.2 % (ref 11.5–15.5)
WBC: 5.3 10*3/uL (ref 4.0–10.5)
WBC: 6.7 10*3/uL (ref 4.0–10.5)
nRBC: 0 % (ref 0.0–0.2)
nRBC: 0 % (ref 0.0–0.2)

## 2021-08-13 LAB — CREATININE, SERUM
Creatinine, Ser: 0.84 mg/dL (ref 0.44–1.00)
GFR, Estimated: >60 mL/min (ref >60)

## 2021-08-13 LAB — BASIC METABOLIC PANEL
Anion gap: 7 (ref 5–15)
BUN: 12 mg/dL (ref 8–23)
CO2: 29 mmol/L (ref 22–32)
Calcium: 8.9 mg/dL (ref 8.9–10.3)
Chloride: 106 mmol/L (ref 98–111)
Creatinine, Ser: 0.71 mg/dL (ref 0.44–1.00)
GFR, Estimated: >60 mL/min (ref >60)
Glucose, Bld: 103 mg/dL — ABNORMAL HIGH (ref 70–99)
Potassium: 4.3 mmol/L (ref 3.5–5.1)
Sodium: 142 mmol/L (ref 135–145)

## 2021-08-13 LAB — TROPONIN I (HIGH SENSITIVITY)
Troponin I (High Sensitivity): 11 ng/L (ref <18)
Troponin I (High Sensitivity): 76 ng/L — ABNORMAL HIGH (ref <18)
Troponin I (High Sensitivity): 68 ng/L — ABNORMAL HIGH (ref <18)
Troponin I (High Sensitivity): 48 ng/L — ABNORMAL HIGH (ref <18)

## 2021-08-13 LAB — HIV ANTIBODY (ROUTINE TESTING W REFLEX): HIV Screen 4th Generation wRfx: NONREACTIVE

## 2021-08-13 MED ORDER — SODIUM CHLORIDE 0.9% FLUSH
3.0000 mL | Freq: Two times a day (BID) | INTRAVENOUS | Status: DC
  Administered 2021-08-13: 3 mL via INTRAVENOUS

## 2021-08-13 MED ORDER — LEVOTHYROXINE SODIUM 25 MCG PO TABS
25.0000 ug | ORAL_TABLET | Freq: Every day | ORAL | Status: DC
  Filled 2021-08-13: qty 1

## 2021-08-13 MED ORDER — ATENOLOL 25 MG PO TABS
12.5000 mg | ORAL_TABLET | Freq: Every day | ORAL | Status: DC
  Filled 2021-08-13: qty 1

## 2021-08-13 MED ORDER — ACETAMINOPHEN 325 MG PO TABS
650.0000 mg | ORAL_TABLET | ORAL | Status: DC | PRN
  Administered 2021-08-14: 10:00:00 650 mg via ORAL
  Filled 2021-08-13: qty 2

## 2021-08-13 MED ORDER — CLOPIDOGREL BISULFATE 75 MG PO TABS
75.0000 mg | ORAL_TABLET | Freq: Every day | ORAL | Status: DC
  Administered 2021-08-13: 75 mg via ORAL
  Filled 2021-08-13: qty 1

## 2021-08-13 MED ORDER — ALPRAZOLAM 0.25 MG PO TABS: 0.5000 mg | ORAL_TABLET | Freq: Two times a day (BID) | ORAL | Status: DC | PRN

## 2021-08-13 MED ORDER — HEPARIN SODIUM (PORCINE) 5000 UNIT/ML IJ SOLN
5000.0000 [IU] | Freq: Three times a day (TID) | INTRAMUSCULAR | Status: DC
  Administered 2021-08-13 – 2021-08-14 (×2): 5000 [IU] via SUBCUTANEOUS
  Filled 2021-08-13 (×2): qty 1

## 2021-08-13 MED ORDER — ONDANSETRON HCL 4 MG/2ML IJ SOLN
4.0000 mg | Freq: Four times a day (QID) | INTRAMUSCULAR | Status: DC | PRN
  Administered 2021-08-14: 10:00:00 4 mg via INTRAVENOUS
  Filled 2021-08-13: qty 2

## 2021-08-13 MED ORDER — CITALOPRAM HYDROBROMIDE 10 MG PO TABS
20.0000 mg | ORAL_TABLET | Freq: Every day | ORAL | Status: DC
  Administered 2021-08-13: 20 mg via ORAL
  Filled 2021-08-13: qty 2

## 2021-08-13 MED ORDER — ISOSORBIDE MONONITRATE ER 30 MG PO TB24
60.0000 mg | ORAL_TABLET | Freq: Every day | ORAL | Status: DC
  Administered 2021-08-13 – 2021-08-14 (×2): 60 mg via ORAL
  Filled 2021-08-13 (×2): qty 2

## 2021-08-13 MED ORDER — ATORVASTATIN CALCIUM 10 MG PO TABS
20.0000 mg | ORAL_TABLET | Freq: Every evening | ORAL | Status: DC
  Administered 2021-08-13: 20 mg via ORAL
  Filled 2021-08-13: qty 2

## 2021-08-13 MED ORDER — PANTOPRAZOLE SODIUM 40 MG PO TBEC
40.0000 mg | DELAYED_RELEASE_TABLET | Freq: Every day | ORAL | Status: DC
  Administered 2021-08-13 – 2021-08-14 (×2): 40 mg via ORAL
  Filled 2021-08-13 (×2): qty 1

## 2021-08-13 MED ORDER — LEVOTHYROXINE SODIUM 25 MCG PO TABS
25.0000 ug | ORAL_TABLET | Freq: Every day | ORAL | Status: DC
  Administered 2021-08-14: 06:00:00 25 ug via ORAL
  Filled 2021-08-13: qty 1

## 2021-08-13 MED ORDER — ACETAMINOPHEN 500 MG PO TABS: 500.0000 mg | ORAL_TABLET | Freq: Four times a day (QID) | ORAL | Status: DC | PRN

## 2021-08-13 MED ORDER — ASPIRIN EC 81 MG PO TBEC
81.0000 mg | DELAYED_RELEASE_TABLET | Freq: Every day | ORAL | Status: DC
  Administered 2021-08-13: 81 mg via ORAL
  Filled 2021-08-13: qty 1

## 2021-08-13 NOTE — ED Provider Notes (Signed)
[facility] EMERGENCY DEPARTMENT Provider Note   CSN: [id number] Arrival date & time: 08/13/21  1054     History Chief Complaint  Patient presents with   Chest Pain    Kelly Warner is a 65 y.o. female.  65 year old female with hypertension, hyperlipidemia, NSTEMI (heart cath in August in the ER in [place], found to have small blockages and treated with Plavix), presents with complaint of chest pain.  Patient states around 10:00 this morning she was in her kitchen when she developed sudden sharp pain under her left breast which then became a sharp pain in her left axillary area and mid chest.  States pain took her breath away, was worse with walking in the kitchen and she called 911.  Pain has since eased however continues to have a dull pain at present.  States pain was intense and took her breath away otherwise does not feel short of breath, denies nausea or diaphoresis. Reports MI in father in his 19s, siblings with a fib, mother with heart disease.      Past Medical History:  Diagnosis Date   GERD (gastroesophageal reflux disease)    Hypertension    Migraines    NSTEMI (non-ST elevation myocardial infarction) ([place])    Stroke [facility])     Patient Active Problem List   Diagnosis Date Noted   History of stroke 11/16/2017   Acute ischemic stroke ([place]) 03/15/2017   Acute recurrent pansinusitis 11/02/2015   Allergic rhinitis 09/20/2015   Hyperlipidemia 09/20/2015   Laryngopharyngeal reflux (LPR) 09/20/2015   Lentigo 09/20/2015   Migraine headache 09/20/2015   Osteopenia 09/20/2015   Other abnormal auditory perceptions, unspecified ear 09/20/2015   Other seborrheic keratosis 09/20/2015   Essential hypertension 03/12/2009   Vitamin D deficiency 09/07/2008    Past Surgical History:  Procedure Laterality Date   ABDOMINAL HYSTERECTOMY     COLONOSCOPY WITH PROPOFOL N/A 04/19/2020   Procedure: COLONOSCOPY WITH PROPOFOL;  Surgeon: Juanita Craver, MD;   Location: [facility];  Service: Endoscopy;  Laterality: N/A;   NASAL FRACTURE SURGERY     POLYPECTOMY  04/19/2020   Procedure: POLYPECTOMY;  Surgeon: Juanita Craver, MD;  Location: [facility];  Service: Endoscopy;;   SHOULDER ARTHROSCOPY Right    TONSILLECTOMY       OB History   No obstetric history on file.     Family History  Problem Relation Age of Onset   Atrial fibrillation Mother 52   Hypertension Mother    Heart attack Father 57   Atrial fibrillation Sister 69   Atrial fibrillation Brother 57   Hypertension Brother     Social History   Tobacco Use   Smoking status: Never   Smokeless tobacco: Never  Vaping Use   Vaping Use: Never used  Substance Use Topics   Alcohol use: Yes    Comment: occ   Drug use: No    Home Medications Prior to Admission medications   Medication Sig Start Date End Date Taking? Authorizing Provider  acetaminophen (TYLENOL) 500 MG tablet Take 500-1,000 mg by mouth every 6 (six) hours as needed for headache.   Yes [provider]  aspirin EC 81 MG tablet Take 81 mg by mouth at bedtime.    Yes [provider]  atenolol (TENORMIN) 25 MG tablet Take 12.5 mg by mouth at bedtime. Patient taking differently at 12.5   Yes [provider]  atorvastatin (LIPITOR) 20 MG tablet Take 20 mg by mouth every evening. 06/26/21  Yes  [provider]  Azelastine-Fluticasone 137-50 MCG/ACT SUSP Place 1 spray into the nose daily. Patient taking differently: Place 1 spray into the nose daily as needed (allergies). 06/04/21  Yes Olalere, Adewale A, MD  citalopram (CELEXA) 20 MG tablet Take 20 mg by mouth at bedtime.  12/31/16  Yes [provider]  clopidogrel (PLAVIX) 75 MG tablet Take 75 mg by mouth at bedtime.   Yes [provider]  dexlansoprazole (DEXILANT) 60 MG capsule Take 60 mg by mouth at bedtime.   Yes [provider]  levothyroxine (SYNTHROID) 25 MCG tablet Take 25 mcg by mouth daily. 03/06/20  Yes  [provider]  mometasone (NASONEX) 50 MCG/ACT nasal spray Place 2 sprays into the nose daily as needed (congestion).   Yes [provider]  montelukast (SINGULAIR) 10 MG tablet Take 10 mg by mouth at bedtime.   Yes [provider]    Allergies    Lactose intolerance (gi)  Review of Systems   Review of Systems  Constitutional:  Negative for chills, diaphoresis and fever.  Respiratory:  Negative for shortness of breath.   Cardiovascular:  Positive for chest pain.  Gastrointestinal:  Negative for abdominal pain, nausea and vomiting.  Musculoskeletal:  Negative for arthralgias and myalgias.  Skin:  Negative for rash and wound.  Allergic/Immunologic: Negative for immunocompromised state.  Neurological:  Negative for weakness.  Hematological:  Negative for adenopathy.  Psychiatric/Behavioral:  Negative for confusion.   All other systems reviewed and are negative.  Physical Exam Updated Vital Signs BP 128/74    Pulse 60    Temp 98 F (36.7 C)    Resp 17    SpO2 96%   Physical Exam Vitals and nursing note reviewed.  Constitutional:      Appearance: She is well-developed.  HENT:     Head: Normocephalic and atraumatic.  Cardiovascular:     Rate and Rhythm: Normal rate and regular rhythm.     Heart sounds: Normal heart sounds. No murmur heard. Pulmonary:     Breath sounds: Normal breath sounds.  Chest:     Chest wall: No tenderness.  Abdominal:     Palpations: Abdomen is soft.     Tenderness: There is no abdominal tenderness.  Musculoskeletal:     Cervical back: Neck supple.     Right lower leg: No tenderness. No edema.     Left lower leg: No tenderness. No edema.  Skin:    General: Skin is warm and dry.     Findings: No erythema or rash.  Neurological:     Mental Status: She is alert and oriented to person, place, and time.  Psychiatric:        Behavior: Behavior normal.    ED Results / Procedures / Treatments   Labs (all labs ordered are  listed, but only abnormal results are displayed) Labs Reviewed  BASIC METABOLIC PANEL - Abnormal; Notable for the following components:      Result Value   Glucose, Bld 103 (*)    All other components within normal limits  CBC  TROPONIN I (HIGH SENSITIVITY)  TROPONIN I (HIGH SENSITIVITY)    EKG EKG Interpretation  Date/Time:  Tuesday August 13 2021 11:09:56 EST Ventricular Rate:  55 PR Interval:  194 QRS Duration: 82 QT Interval:  444 QTC Calculation: 424 R Axis:   -14 Text Interpretation: Sinus bradycardia Otherwise normal ECG Confirmed by Carmin Muskrat [phone]) on 08/13/2021 3:13:48 PM  Radiology DG Chest 2 View  Result Date:  08/13/2021 CLINICAL DATA:  Sudden onset of chest pain 1 hour ago EXAM: CHEST - 2 VIEW COMPARISON:  None. FINDINGS: Heart size is normal. Mediastinal shadows are normal. The lungs are clear. No bronchial thickening. No infiltrate, mass, effusion or collapse. Pulmonary vascularity is normal. No bony abnormality. IMPRESSION: Normal chest Electronically Signed   By: Nelson Chimes M.D.   On: 08/13/2021 11:50    Procedures Procedures   Medications Ordered in ED Medications - No data to display  ED Course  I have reviewed the triage vital signs and the nursing notes.  Pertinent labs & imaging results that were available during my care of the patient were reviewed by me and considered in my medical decision making (see chart for details).  Clinical Course as of 08/13/21 1514  Tue Dec 13, 162  [id number] 65 year old female with complaint of chest pain as above. On exam, well appearing, no chest wall tenderness, abdomen soft and non tender.  EKG without ischemic changes.  Initial troponin 11, pending repeat. CBC and BMP without significant findings.  CXR unremarkable.  Discussed with Dr. Vanita Panda, Er attending. Care signed out to on coming provider pending repeat troponin and disposition.  [LM]    Clinical Course User Index [LM] Roque Lias    MDM Rules/Calculators/A&P                           Final Clinical Impression(s) / ED Diagnoses Final diagnoses:  Chest pain, unspecified type    Rx / DC Orders ED Discharge Orders     None        Roque Lias 08/13/21 1514    Carmin Muskrat, MD 08/14/21 1321

## 2021-08-13 NOTE — ED Triage Notes (Signed)
Pt to triage via [facility] from home.  Reports sudden onset of chest pain at 10am.  Denies sob, nausea, and vomiting.  Felt cool and clammy[place]     EKG- BP 146/82 Hr 60 CBG 102 ASA 324mg - pain resolved No NTG IV- 20g LAC

## 2021-08-13 NOTE — H&P (Signed)
CARDIOLOGY ADMIT NOTE   Patient ID: Kelly Warner MRN: [id number] DOB/AGE: [birth year] 65 y.o.  Admit date: 08/13/2021 Primary Physician:  Richmond Campbell., PA-C  Patient ID: Kelly Warner, female    DOB: Jan 23, 1956, 65 y.o.   MRN: [id number]  Chief Complaint  Patient presents with   Chest Pain   HPI:    Kelly Warner  is a 65 y.o.  Caucasian female patient with hypertension, hyperlipidemia, hyperglycemia, history of ischemic stroke in 2018, hypothyroidism and GERD and migraine headaches, obstructive sleep apnea on CPAP presented to [facility] on 04/18/2021 with NSTEMI.  Cardiac catheterization revealing small vessel disease, recommended medical therapy.  She established with me on 04/26/2021, has been doing well until this late morning around 11 AM after having eaten, was sitting down with her husband when she started having sharp chest pains across her chest and also difficulty breathing.  Eventually she started feeling heaviness and tightness in the middle of the chest associated with marked dyspnea.  EMS was activated and she was brought to the emergency room.  He continues to have mild chest tightness but states that it is overall improved.  She received isosorbide mononitrate in the emergency room and since then states that her chest discomfort has improved.  She has not had any rest dyspnea, but still has very mild chest tightness.  Denies leg edema, denies hemoptysis, no change in bowels, has been compliant with CPAP.  Past Medical History:  Diagnosis Date   GERD (gastroesophageal reflux disease)    Hypertension    Migraines    NSTEMI (non-ST elevation myocardial infarction) (HCC)    Stroke [facility])    Past Surgical History:  Procedure Laterality Date   ABDOMINAL HYSTERECTOMY     COLONOSCOPY WITH PROPOFOL N/A 04/19/2020   Procedure: COLONOSCOPY WITH PROPOFOL;  Surgeon: Charna Elizabeth, MD;  Location: [facility] ENDOSCOPY;  Service: Endoscopy;  Laterality: N/A;   NASAL  FRACTURE SURGERY     POLYPECTOMY  04/19/2020   Procedure: POLYPECTOMY;  Surgeon: Charna Elizabeth, MD;  Location: [facility] ENDOSCOPY;  Service: Endoscopy;;   SHOULDER ARTHROSCOPY Right    TONSILLECTOMY     Social History   Socioeconomic History   Marital status: Married    Spouse name: Onalee Hua   Number of children: 3   Years of education: 4   Highest education level: Not on file  Occupational History    Comment: RN medical records  Tobacco Use   Smoking status: Never   Smokeless tobacco: Never  Vaping Use   Vaping Use: Never used  Substance and Sexual Activity   Alcohol use: Yes    Comment: occ   Drug use: No   Sexual activity: Not on file  Other Topics Concern   Not on file  Social History Narrative   Lives with husband   Caffeine - none   Social Determinants of Health   Financial Resource Strain: Not on file  Food Insecurity: Not on file  Transportation Needs: Not on file  Physical Activity: Not on file  Stress: Not on file  Social Connections: Not on file  Intimate Partner Violence: Not on file   Family History  Problem Relation Age of Onset   Atrial fibrillation Mother 61   Hypertension Mother    Heart attack Father 44   Atrial fibrillation Sister 11   Atrial fibrillation Brother 50   Hypertension Brother     ROS  Review of Systems  Constitutional: Negative.  Eyes: Negative.   Cardiovascular:  Negative for chest pain and leg swelling.  Respiratory:  Positive for shortness of breath and snoring (on CPAP).   Skin: Negative.   Musculoskeletal: Negative.   Gastrointestinal:  Negative for melena.  All other systems reviewed and are negative. Objective   Vitals with BMI 08/13/2021 08/13/2021 08/13/2021  Height - - -  Weight - - -  BMI - - -  Systolic 276 147 092  Diastolic 77 75 73  Pulse 71 85 64    Physical Exam Constitutional:      General: She is not in acute distress.    Appearance: She is obese.  Eyes:     Extraocular Movements: Extraocular movements  intact.  Neck:     Vascular: No carotid bruit or JVD.  Cardiovascular:     Rate and Rhythm: Normal rate and regular rhythm.     Pulses: Intact distal pulses.     Heart sounds: Normal heart sounds. No murmur heard.   No gallop.  Pulmonary:     Effort: Pulmonary effort is normal.     Breath sounds: Normal breath sounds.  Abdominal:     General: Bowel sounds are normal.     Palpations: Abdomen is soft.  Musculoskeletal:     Cervical back: Normal range of motion.     Right lower leg: No edema.     Left lower leg: No edema.  Skin:    General: Skin is warm.     Capillary Refill: Capillary refill takes less than 2 seconds.  Neurological:     General: No focal deficit present.     Mental Status: She is alert.  Psychiatric:        Mood and Affect: Mood normal.        Behavior: Behavior normal.   Laboratory examination:   Recent Labs    08/13/21 1130  NA 142  K 4.3  CL 106  CO2 29  GLUCOSE 103*  BUN 12  CREATININE 0.71  CALCIUM 8.9  GFRNONAA >60   CrCl cannot be calculated (Unknown ideal weight.).  CMP Latest Ref Rng & Units 08/13/2021 03/15/2017 03/15/2017  Glucose 70 - 99 mg/dL 103(H) 114(H) 116(H)  BUN 8 - 23 mg/dL $Remove'12 16 14  'KbOwjEE$ Creatinine 0.44 - 1.00 mg/dL 0.71 0.70 0.82  Sodium 135 - 145 mmol/L 142 140 138  Potassium 3.5 - 5.1 mmol/L 4.3 3.9 3.9  Chloride 98 - 111 mmol/L 106 103 105  CO2 22 - 32 mmol/L 29 - 25  Calcium 8.9 - 10.3 mg/dL 8.9 - 9.1  Total Protein 6.5 - 8.1 g/dL - - 6.9  Total Bilirubin 0.3 - 1.2 mg/dL - - 0.8  Alkaline Phos 38 - 126 U/L - - 123  AST 15 - 41 U/L - - 20  ALT 14 - 54 U/L - - 17   CBC Latest Ref Rng & Units 08/13/2021 03/15/2017 03/15/2017  WBC 4.0 - 10.5 K/uL 5.3 - 6.0  Hemoglobin 12.0 - 15.0 g/dL 14.0 15.0 14.5  Hematocrit 36.0 - 46.0 % 43.1 44.0 43.9  Platelets 150 - 400 K/uL 266 - 288   Lipid Panel     Component Value Date/Time   CHOL 177 03/16/2017 0307   TRIG 84 03/16/2017 0307   HDL 55 03/16/2017 0307   CHOLHDL 3.2 03/16/2017  0307   VLDL 17 03/16/2017 0307   LDLCALC 105 (H) 03/16/2017 0307   HEMOGLOBIN A1C Lab Results  Component Value Date   HGBA1C 5.8 (H) 03/16/2017   MPG 120 03/16/2017  TSH No results for input(s): TSH in the last 8760 hours. BNP (last 3 results) No results for input(s): BNP in the last 8760 hours. Cardiac Panel (last 3 results) Recent Labs    08/13/21 1130 08/13/21 1416 08/13/21 1737  TROPONINIHS 11 76* 68*    External labs: Labs 04/23/2021:   TSH normal at 3.04.  A1c 5.9%.   Hb 13.5/HCT 40.9, platelets 206, normal indicis.   Serum glucose 138 mg, BUN 19, creatinine 0.78, EGFR >60 mL.  ALT, AST normal, alkaline phosphatase mildly elevated at 144.   Total cholesterol 135, triglycerides 83, HDL 58, LDL 59. Medications and allergies   Allergies  Allergen Reactions   Lactose Intolerance (Gi) Other (See Comments)    GI upset      Current Outpatient Medications  Medication Instructions   acetaminophen (TYLENOL) 500-1,000 mg, Oral, Every 6 hours PRN   aspirin EC 81 mg, Oral, Daily at bedtime   atenolol (TENORMIN) 12.5 mg, Oral, Daily at bedtime, Patient taking differently at 12.5   atorvastatin (LIPITOR) 20 mg, Oral, Every evening   Azelastine-Fluticasone 137-50 MCG/ACT SUSP 1 spray, Nasal, Daily   citalopram (CELEXA) 20 mg, Oral, Daily at bedtime   clopidogrel (PLAVIX) 75 mg, Oral, Daily at bedtime   dexlansoprazole (DEXILANT) 60 mg, Oral, Daily at bedtime   levothyroxine (SYNTHROID) 25 mcg, Oral, Daily   mometasone (NASONEX) 50 MCG/ACT nasal spray 2 sprays, Nasal, Daily PRN   montelukast (SINGULAIR) 10 mg, Oral, Daily at bedtime     Radiology:  DG Chest 2 View  Result Date: 08/13/2021 CLINICAL DATA:  Sudden onset of chest pain 1 hour ago EXAM: CHEST - 2 VIEW COMPARISON:  None. FINDINGS: Heart size is normal. Mediastinal shadows are normal. The lungs are clear. No bronchial thickening. No infiltrate, mass, effusion or collapse. Pulmonary vascularity is normal. No  bony abnormality. IMPRESSION: Normal chest Electronically Signed   By: Nelson Chimes M.D.   On: 08/13/2021 11:50    Cardiac Studies:   Coronary Angiogram 04/19/2021 The left ventricular ejection fraction: 0.6. LV Pressure: 90; edp 22 mmHg. Aortic Pressure: 90/64 The left main coronary artery was large in caliber and bifurcated into the LAD and LCX with no angiographic coronary artery disease.  The LAD gave rise to 5 small diagonals across the anterolateral wall with small caliber high D1 with diffuse small vessel disease.  The LCX gave rise to 2 OM branches across the lateral wall with small caliber high OM1 with diffuse small vessel disease.  The RCA was a dominant artery and supplied both the PDA and PLB with distal RCA small vessel disease with faint left to right collaterals.   Impression: Widely patent large epicardial coronary arteries with small vessel disease in small caliber OM1 and small caliber D1 and distal RCA small vessel disease for medical therapy. Normal LVEF. Elevated left heart pressures. No Aortic valve gradient.   Echocardiogram 04/18/2021:   1. Normal left ventricular cavity size. Mild concentric left ventricular hypertrophy. Normal left ventricular systolic function. LV Ejection Fraction  is approximately: 65 %.  2. Left ventricular diastolic parameters are consistent with mild (Grade I) diastolic dysfunction (impaired relaxation). There are no regional wall motion abnormalities.  3. Normal right ventricular systolic function.  4. The aortic root is normal in size. The ascending aorta is normal in size.    EKG 08/13/2021: Normal sinus rhythm without evidence of ischemia.  Normal EKG.  Assessment   1.  Unstable angina pectoris, serum troponins delta >20% suggestive of NSTEMI.  2.  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 3.  Hypercholesterolemia 4.  Primary hypertension  Recommendations:   ELAISHA ZAHNISER is a 65 y.o. Caucasian female patient with hypertension, hyperlipidemia, hyperglycemia, history of ischemic stroke in 2018, hypothyroidism and GERD and migraine headaches, obstructive sleep apnea on CPAP presented to [facility] on 04/18/2021 with NSTEMI.  Cardiac catheterization revealing small vessel disease, recommended medical therapy.  She established with me on 04/26/2021.  Today started having chest tightness and was brought to the emergency room where her serum troponins are elevated suggestive of unstable angina.   Third set of serum troponins are relatively flat however patient is still having chest tightness.  Hence I will admit the patient for overnight observation and schedule him for cardiac catheterization tomorrow.  Her husband is present at the bedside. Schedule for cardiac catheterization, and possible angioplasty. We discussed regarding risks, benefits, alternatives to this including stress testing, CTA and continued medical therapy. Patient wants to proceed. Understands <1-2% risk of death, stroke, MI, urgent CABG, bleeding, infection, renal failure but not limited to these.  As troponins are flat and EKG is normal, chest pain symptoms have improved, I will continue with dual antiplatelet therapy and nitroglycerin sublingual as needed basis only.  She has received 1 dose of Imdur.  She is already on a beta-blocker and high intensity statin, continue the same for now.     Adrian Prows, MD, [facility] 08/13/2021, 7:19 PM Office: [phone] Fax: [phone] Pager: [phone]

## 2021-08-13 NOTE — ED Provider Notes (Signed)
°  Physical Exam  BP (!) 145/71    Pulse 61    Temp 98 F (36.7 C)    Resp 16    SpO2 98%   Physical Exam Vitals and nursing note reviewed.  Constitutional:      General: She is not in acute distress.    Appearance: She is well-developed.  HENT:     Head: Normocephalic and atraumatic.  Eyes:     Extraocular Movements: Extraocular movements intact.  Cardiovascular:     Rate and Rhythm: Normal rate.  Pulmonary:     Effort: Pulmonary effort is normal.  Abdominal:     General: There is no distension.  Musculoskeletal:        General: Normal range of motion.     Cervical back: Normal range of motion.  Skin:    General: Skin is warm.     Findings: No rash.  Neurological:     Mental Status: She is alert and oriented to person, place, and time.    ED Course/Procedures   Clinical Course as of 08/13/21 1542  Tue Aug 13, 3857  [id number] 65 year old female with complaint of chest pain as above. On exam, well appearing, no chest wall tenderness, abdomen soft and non tender.  EKG without ischemic changes.  Initial troponin 11, pending repeat. CBC and BMP without significant findings.  CXR unremarkable.  Discussed with Dr. Vanita Panda, Er attending. Care signed out to on coming provider pending repeat troponin and disposition.  [LM]    Clinical Course User Index [LM] Tacy Learn, PA-C    Procedures  MDM   Patient signed out to me by L. Percell Miller, PA-C.  Please see previous notes for further history.  In brief, patient presented for evaluation of chest pain.  Began suddenly at 10 AM this morning.  She had stabbing pain to her left breast which moved to her axilla and then substernal.  Pain is worse with walking.  The pain was so much that it "took her breath away."  However outside of that, she did not feel short of breath.  No nausea, vomiting, diaphoresis, clamminess.  The pain is been constant, but gradually improving.  Is now a dull pain.  Received aspirin with EMS.  Had an NSTEMI in  August in [place], had a subsequent cath which showed a small blockage she was started on Plavix no stent was placed.  Followed by Dr. Einar Gip.  Initial labs reassuring, pending repeat troponin.  Repeat troponin elevated, went from 11-76.  As such, patient will need to be admitted.  Will consult with Dr Einar Gip. On reevaluation, pt with no pain. Appears nontoxic.   Discussed with Dr. Einar Gip from cardiology, who will come and evaluate the patient for admission.  Requests another set of troponins and that patient received 60 mg of Imdur.     Franchot Heidelberg, PA-C 08/13/21 1640    Truddie Hidden, MD 08/13/21 [phone]

## 2021-08-13 NOTE — ED Provider Notes (Signed)
Emergency Medicine Provider Triage Evaluation Note  Kelly Warner , a 65 y.o. female  was evaluated in triage.  Patient complains of chest pain that started about 10 AM.  She says this lasted for about 10 minutes it went away.  It was in the middle of her chest and radiated over to the left side down her rib cage.  She felt cool and clammy at the time.  She did not have any shortness of breath, nausea, vomiting, abdominal pain.  She has [place]STEM[place] and states that this feels differently.  At that time she had pain radiating up into her neck with feelings of indigestion.  Review of Systems  Positive:  Negative:   Physical Exam  BP (!) 156/85 (BP Location: Left Arm)    Pulse (!) 58    Temp 99.1 F (37.3 C) (Oral)    Resp 14    SpO2 97%  Gen:   Awake, no distress   Resp:  Normal effort.  Lungs CTA. MSK:   Moves extremities without difficulty  Other:  Heart RRR with no murmurs.  Medical Decision Making  Medically screening exam initiated at 11:33 AM.  Appropriate orders placed.  EMALI HEYWARD was informed that the remainder of the evaluation will be completed by another provider, this initial triage assessment does not replace that evaluation, and the importance of remaining in the ED until their evaluation is complete.     Sheila Oats 08/13/21 1134    Daleen Bo, MD 08/13/21 1723

## 2021-08-14 ENCOUNTER — Other Ambulatory Visit (HOSPITAL_COMMUNITY): Payer: Self-pay

## 2021-08-14 ENCOUNTER — Ambulatory Visit (HOSPITAL_COMMUNITY): Payer: Medicare HMO

## 2021-08-14 ENCOUNTER — Encounter (HOSPITAL_COMMUNITY)
Admission: EM | Disposition: A | Discharge status: Home / Self Care | Payer: Self-pay | Source: Home / Self Care | Attending: Emergency Medicine

## 2021-08-14 DIAGNOSIS — I214 Non-ST elevation (NSTEMI) myocardial infarction: Secondary | ICD-10-CM

## 2021-08-14 HISTORY — PX: LEFT HEART CATH AND CORONARY ANGIOGRAPHY: CATH118249

## 2021-08-14 LAB — RESP PANEL BY RT-PCR (FLU A&B, COVID) ARPGX2
Influenza A by PCR: NEGATIVE
Influenza B by PCR: NEGATIVE
SARS Coronavirus 2 by RT PCR: NEGATIVE

## 2021-08-14 SURGERY — LEFT HEART CATH AND CORONARY ANGIOGRAPHY
Anesthesia: LOCAL

## 2021-08-14 MED ORDER — SODIUM CHLORIDE 0.9 % WEIGHT BASED INFUSION: 3.0000 mL/kg/h | INTRAVENOUS | Status: AC

## 2021-08-14 MED ORDER — LIDOCAINE HCL (PF) 1 % IJ SOLN
INTRAMUSCULAR | Status: AC
  Filled 2021-08-14: qty 30

## 2021-08-14 MED ORDER — HEPARIN (PORCINE) IN NACL 1000-0.9 UT/500ML-% IV SOLN
INTRAVENOUS | Status: AC
  Filled 2021-08-14: qty 1000

## 2021-08-14 MED ORDER — SODIUM CHLORIDE 0.9% FLUSH: 3.0000 mL | INTRAVENOUS | Status: DC | PRN

## 2021-08-14 MED ORDER — SODIUM CHLORIDE 0.9 % WEIGHT BASED INFUSION
1.0000 mL/kg/h | INTRAVENOUS | Status: DC
  Administered 2021-08-14: 08:00:00 1 mL/kg/h via INTRAVENOUS

## 2021-08-14 MED ORDER — SODIUM CHLORIDE 0.9 % IV SOLN: 250.0000 mL | INTRAVENOUS | Status: DC | PRN

## 2021-08-14 MED ORDER — HYDRALAZINE HCL 20 MG/ML IJ SOLN: 10.0000 mg | INTRAMUSCULAR | Status: DC | PRN

## 2021-08-14 MED ORDER — HEPARIN SODIUM (PORCINE) 1000 UNIT/ML IJ SOLN
INTRAMUSCULAR | Status: AC
  Filled 2021-08-14: qty 10

## 2021-08-14 MED ORDER — FENTANYL CITRATE (PF) 100 MCG/2ML IJ SOLN
INTRAMUSCULAR | Status: AC
  Filled 2021-08-14: qty 2

## 2021-08-14 MED ORDER — HEPARIN SODIUM (PORCINE) 1000 UNIT/ML IJ SOLN
INTRAMUSCULAR | Status: DC | PRN
  Administered 2021-08-14: 4000 [IU] via INTRAVENOUS

## 2021-08-14 MED ORDER — SODIUM CHLORIDE 0.9% FLUSH: 3.0000 mL | Freq: Two times a day (BID) | INTRAVENOUS | Status: DC

## 2021-08-14 MED ORDER — LABETALOL HCL 5 MG/ML IV SOLN: 10.0000 mg | INTRAVENOUS | Status: DC | PRN

## 2021-08-14 MED ORDER — MIDAZOLAM HCL 2 MG/2ML IJ SOLN
INTRAMUSCULAR | Status: AC
  Filled 2021-08-14: qty 2

## 2021-08-14 MED ORDER — ISOSORBIDE MONONITRATE ER 30 MG PO TB24
15.0000 mg | ORAL_TABLET | Freq: Every day | ORAL | 1 refills | Status: DC
  Filled 2021-08-14: qty 30, 60d supply, fill #0

## 2021-08-14 MED ORDER — ONDANSETRON HCL 4 MG/2ML IJ SOLN
INTRAMUSCULAR | Status: AC
  Filled 2021-08-14: qty 2

## 2021-08-14 MED ORDER — IOHEXOL 350 MG/ML SOLN
INTRAVENOUS | Status: DC | PRN
  Administered 2021-08-14: 14:00:00 50 mL

## 2021-08-14 MED ORDER — SODIUM CHLORIDE 0.9 % WEIGHT BASED INFUSION: 3.0000 mL/kg/h | INTRAVENOUS | Status: DC

## 2021-08-14 MED ORDER — FENTANYL CITRATE (PF) 100 MCG/2ML IJ SOLN
INTRAMUSCULAR | Status: DC | PRN
  Administered 2021-08-14: 50 ug via INTRAVENOUS

## 2021-08-14 MED ORDER — LIDOCAINE HCL (PF) 1 % IJ SOLN
INTRAMUSCULAR | Status: DC | PRN
  Administered 2021-08-14: 2 mL

## 2021-08-14 MED ORDER — VERAPAMIL HCL 2.5 MG/ML IV SOLN
INTRAVENOUS | Status: DC | PRN
  Administered 2021-08-14: 14:00:00 10 mL via INTRA_ARTERIAL

## 2021-08-14 MED ORDER — HEPARIN (PORCINE) IN NACL 1000-0.9 UT/500ML-% IV SOLN
INTRAVENOUS | Status: DC | PRN
  Administered 2021-08-14 (×2): 500 mL

## 2021-08-14 MED ORDER — MIDAZOLAM HCL 2 MG/2ML IJ SOLN
INTRAMUSCULAR | Status: DC | PRN
  Administered 2021-08-14: 1 mg via INTRAVENOUS

## 2021-08-14 MED ORDER — SODIUM CHLORIDE 0.9 % IV SOLN: INTRAVENOUS | Status: AC

## 2021-08-14 MED ORDER — ASPIRIN 81 MG PO CHEW
81.0000 mg | CHEWABLE_TABLET | ORAL | Status: AC
  Administered 2021-08-14: 06:00:00 81 mg via ORAL
  Filled 2021-08-14: qty 1

## 2021-08-14 MED ORDER — SODIUM CHLORIDE 0.9 % WEIGHT BASED INFUSION: 1.0000 mL/kg/h | INTRAVENOUS | Status: DC

## 2021-08-14 MED ORDER — ACETAMINOPHEN 325 MG PO TABS: 650.0000 mg | ORAL_TABLET | ORAL | Status: DC | PRN

## 2021-08-14 MED ORDER — ONDANSETRON HCL 4 MG/2ML IJ SOLN: 4.0000 mg | Freq: Four times a day (QID) | INTRAMUSCULAR | Status: DC | PRN

## 2021-08-14 MED ORDER — VERAPAMIL HCL 2.5 MG/ML IV SOLN
INTRAVENOUS | Status: AC
  Filled 2021-08-14: qty 2

## 2021-08-14 SURGICAL SUPPLY — 9 items
CATH OPTITORQUE TIG 4.0 5F (CATHETERS) ×1 IMPLANT
DEVICE RAD COMP TR BAND LRG (VASCULAR PRODUCTS) ×1 IMPLANT
GLIDESHEATH SLEND A-KIT 6F 22G (SHEATH) ×1 IMPLANT
GLIDESHEATH SLEND SS 6F .021 (SHEATH) ×1 IMPLANT
KIT HEART LEFT (KITS) ×2 IMPLANT
PACK CARDIAC CATHETERIZATION (CUSTOM PROCEDURE TRAY) ×2 IMPLANT
TRANSDUCER W/STOPCOCK (MISCELLANEOUS) ×2 IMPLANT
TUBING CIL FLEX 10 FLL-RA (TUBING) ×2 IMPLANT
WIRE HI TORQ VERSACORE-J 145CM (WIRE) ×1 IMPLANT

## 2021-08-14 NOTE — ED Notes (Signed)
Pt c/o headache. Pt given icepack.

## 2021-08-14 NOTE — Discharge Summary (Addendum)
Physician Discharge Summary  Patient ID: Kelly Warner MRN: [id number] DOB/AGE: [birth year] 65 y.o.  Admit date: 08/13/2021 Discharge date: 08/14/2021  Primary Discharge Diagnosis: NSTEMI  Secondary Discharge Diagnosis: Hypertension Hyperlipidemia OSA H/o stroke   Hospital Course:   65 year old Caucasian female with hypertension, hyperlipidemia, OSA, h/o ischemic stroke 2018, known coronary artery disease, medically managed after recent non-STEMI in 04/2021, admitted with left-sided chest pain radiating to left arm, mild troponin elevation  Coronary angiogram showed no major epicardial vessel disease. Branch vessels are very small. I was not able to appreciate any significant disease. Overall, picture consistent w/MINOCA.  Recommend DAPT for 1 year. Added low dose Imdur  Did the patient have an acute coronary syndrome (MI, NSTEMI, STEMI, etc) this admission?: Yes                               AHA/ACC Clinical Performance & Quality Measures: Aspirin prescribed? - Yes ADP Receptor Inhibitor (Plavix/Clopidogrel, Brilinta/Ticagrelor or Effient/Prasugrel) prescribed (includes medically managed patients)? - Yes Beta Blocker prescribed? - Yes High Intensity Statin (Lipitor 40-80mg  or Crestor 20-40mg ) prescribed? - Yes EF assessed during THIS hospitalization? - No - Recent echocardiogram showed normal EF For EF <40%, was ACEI/ARB prescribed? - Not Applicable (EF >/= 01%) For EF <40%, Aldosterone Antagonist (Spironolactone or Eplerenone) prescribed? - Not Applicable (EF >/= 60%) Cardiac Rehab Phase II ordered (including medically managed patients)? - Yes    Discharge Exam: Blood pressure (!) 117/59, pulse 73, temperature 98.2 F (36.8 C), temperature source Oral, resp. rate (!) 33, height 5\' 4"  (1.626 m), weight 79.8 kg, SpO2 95 %.   Physical Exam Vitals and nursing note reviewed.  Constitutional:      General: She is not in acute distress.    Appearance: She is well-developed.   HENT:     Head: Normocephalic and atraumatic.  Eyes:     Conjunctiva/sclera: Conjunctivae normal.     Pupils: Pupils are equal, round, and reactive to light.  Neck:     Vascular: No JVD.  Cardiovascular:     Rate and Rhythm: Normal rate and regular rhythm.     Pulses: Normal pulses and intact distal pulses.     Heart sounds: No murmur heard. Pulmonary:     Effort: Pulmonary effort is normal.     Breath sounds: Normal breath sounds. No wheezing or rales.  Abdominal:     General: Bowel sounds are normal.     Palpations: Abdomen is soft.     Tenderness: There is no rebound.  Musculoskeletal:        General: No tenderness. Normal range of motion.     Right lower leg: No edema.     Left lower leg: No edema.  Lymphadenopathy:     Cervical: No cervical adenopathy.  Skin:    General: Skin is warm and dry.  Neurological:     Mental Status: She is alert and oriented to person, place, and time.     Cranial Nerves: No cranial nerve deficit.      Significant Diagnostic Studies:   EKG 08/13/2021:  Normal sinus rhythm without evidence of ischemia.  Normal EKG.  Coronary angiogram 08/14/2021: LM: Normal LAD: No disease in LAD         Very small diagonal vessels (<1 mm) with no signifiant disease Lcx: No disease in Lcx        Small OM branches (<2 mm), no significant disease RCA: No disease in  RCA         Small branches off PDA/PLA, no significant disease   Normal LVEDP Overall presentation c/w MINOCA. Added low dose Imdur, in addition to atenolol. Continue DAPT for a year  Echocardiogram 04/18/2021:   1. Normal left ventricular cavity size. Mild concentric left ventricular hypertrophy. Normal left ventricular systolic function. LV Ejection Fraction  is approximately: 65 %.  2. Left ventricular diastolic parameters are consistent with mild (Grade I) diastolic dysfunction (impaired relaxation). There are no regional wall motion abnormalities.  3. Normal right ventricular  systolic function.  4. The aortic root is normal in size. The ascending aorta is normal in size.     Labs:   Lab Results  Component Value Date   WBC 6.7 08/13/2021   HGB 13.1 08/13/2021   HCT 40.4 08/13/2021   MCV 92.4 08/13/2021   PLT 261 08/13/2021    Recent Labs  Lab 08/13/21 1130 08/13/21 1957  NA 142  --   K 4.3  --   CL 106  --   CO2 29  --   BUN 12  --   CREATININE 0.71 0.84  CALCIUM 8.9  --   GLUCOSE 103*  --     Lipid Panel     Component Value Date/Time   CHOL 177 03/16/2017 0307   TRIG 84 03/16/2017 0307   HDL 55 03/16/2017 0307   CHOLHDL 3.2 03/16/2017 0307   VLDL 17 03/16/2017 0307   LDLCALC 105 (H) 03/16/2017 0307    BNP (last 3 results) No results for input(s): BNP in the last 8760 hours.  HEMOGLOBIN A1C Lab Results  Component Value Date   HGBA1C 5.8 (H) 03/16/2017   MPG 120 03/16/2017    Cardiac Panel (last 3 results) No results for input(s): CKTOTAL, CKMB, TROPONINI, RELINDX in the last 8760 hours.  No results found for: CKTOTAL, CKMB, CKMBINDEX, TROPONINI   TSH No results for input(s): TSH in the last 8760 hours.  Radiology: DG Chest 2 View  Result Date: 08/13/2021 CLINICAL DATA:  Sudden onset of chest pain 1 hour ago EXAM: CHEST - 2 VIEW COMPARISON:  None. FINDINGS: Heart size is normal. Mediastinal shadows are normal. The lungs are clear. No bronchial thickening. No infiltrate, mass, effusion or collapse. Pulmonary vascularity is normal. No bony abnormality. IMPRESSION: Normal chest Electronically Signed   By: Nelson Chimes M.D.   On: 08/13/2021 11:50   CARDIAC CATHETERIZATION  Result Date: 08/14/2021 Images from the original result were not included. LM: Normal LAD: No disease in LAD         Very small diagonal vessels (<1 mm) with no signifiant disease Lcx: No disease in Lcx        Small OM branches (<2 mm), no significant disease RCA: No disease in RCA         Small branches off PDA/PLA, no significant disease Normal LVEDP Overall  presentation c/w MINOCA. Added low dose Imdur, in addition to atenolol. Continue DAPT for a year Nigel Mormon, MD Pager: [phone] Office: [phone]     FOLLOW UP PLANS AND APPOINTMENTS Discharge Instructions     Diet - low sodium heart healthy   Complete by: As directed    Increase activity slowly   Complete by: As directed       Allergies as of 08/14/2021       Reactions   Lactose Intolerance (gi) Other (See Comments)   GI upset        Medication List  TAKE these medications    acetaminophen 500 MG tablet Commonly known as: TYLENOL Take 500-1,000 mg by mouth every 6 (six) hours as needed for headache.   aspirin EC 81 MG tablet Take 81 mg by mouth at bedtime.   atenolol 25 MG tablet Commonly known as: TENORMIN Take 12.5 mg by mouth at bedtime. Patient taking differently at 12.5   atorvastatin 20 MG tablet Commonly known as: LIPITOR Take 20 mg by mouth every evening.   Azelastine-Fluticasone 137-50 MCG/ACT Susp Place 1 spray into the nose daily. What changed:  when to take this reasons to take this   citalopram 20 MG tablet Commonly known as: CELEXA Take 20 mg by mouth at bedtime.   clopidogrel 75 MG tablet Commonly known as: PLAVIX Take 75 mg by mouth at bedtime.   dexlansoprazole 60 MG capsule Commonly known as: DEXILANT Take 60 mg by mouth at bedtime.   isosorbide mononitrate 30 MG 24 hr tablet Commonly known as: IMDUR Take 1/2 tablet (15 mg total) by mouth daily.   levothyroxine 25 MCG tablet Commonly known as: SYNTHROID Take 25 mcg by mouth daily.   mometasone 50 MCG/ACT nasal spray Commonly known as: NASONEX Place 2 sprays into the nose daily as needed (congestion).   montelukast 10 MG tablet Commonly known as: SINGULAIR Take 10 mg by mouth at bedtime.        Follow-up Information     Alethia Berthold, PA-C Follow up on 08/27/2021.   Specialty: Cardiology Why: 2:30 PM Contact information: [place] [phone]                   Nigel Mormon, MD Pager: [phone] Office: [phone]

## 2021-08-14 NOTE — Interval H&P Note (Signed)
History and Physical Interval Note:  08/14/2021 1:52 PM  Kelly Warner  has presented today for surgery, with the diagnosis of nstemi.  The various methods of treatment have been discussed with the patient and family. After consideration of risks, benefits and other options for treatment, the patient has consented to  Procedure(s): LEFT HEART CATH AND CORONARY ANGIOGRAPHY (N/A) as a surgical intervention.  The patient's history has been reviewed, patient examined, no change in status, stable for surgery.  I have reviewed the patient's chart and labs.  Questions were answered to the patient's satisfaction.    2016 Appropriate Use Criteria for Coronary Revascularization in Patients With Acute Coronary Syndrome NSTEMI/Unstable angina, stabilized patient at high risk Indication:  Revascularization by PCI or CABG of 1 or more arteries in a patient with NSTEMI or unstable angina with Stabilization after presentation High risk for clinical events A (7) Indication: 16; Score 7 [place]

## 2021-08-15 ENCOUNTER — Encounter (HOSPITAL_COMMUNITY): Payer: Self-pay | Admitting: Cardiology

## 2021-08-15 ENCOUNTER — Telehealth: Payer: Self-pay

## 2021-08-15 ENCOUNTER — Other Ambulatory Visit: Payer: Self-pay

## 2021-08-15 MED ORDER — ONDANSETRON HCL 4 MG PO TABS
4.0000 mg | ORAL_TABLET | Freq: Three times a day (TID) | ORAL | 3 refills | Status: DC | PRN
Start: 2021-08-15 — End: 2021-08-27

## 2021-08-15 NOTE — Telephone Encounter (Signed)
Location of hospitalization: [place] Reason for hospitalization: heart cath/chest pain Date of discharge: 08/14/21 Date of first communication with patient: today Person contacting patient: Lalania Haseman  Current symptoms: nausea and vomiting Jg sent in zofran Do you understand why you were in the Hospital: Yes Questions regarding discharge instructions: None Where were you discharged to: Home Medications reviewed: Yes Allergies reviewed: Yes Dietary changes reviewed: Yes. Discussed low fat and low salt diet.  Referals reviewed: NA Activities of Daily Living: Able to with mild limitations Any transportation issues/concerns: None Any patient concerns: None Confirmed importance & date/time of Follow up appt: Yes Confirmed with patient if condition begins to worsen call. Pt was given the office number and encouraged to call back with questions or concerns: Yes

## 2021-08-16 ENCOUNTER — Ambulatory Visit (HOSPITAL_COMMUNITY): Payer: Medicare HMO

## 2021-08-19 ENCOUNTER — Ambulatory Visit (HOSPITAL_COMMUNITY): Payer: Medicare HMO

## 2021-08-19 ENCOUNTER — Telehealth (HOSPITAL_COMMUNITY): Payer: Self-pay

## 2021-08-19 NOTE — Telephone Encounter (Signed)
Called patient to see if she is interested in the Cardiac Rehab Program. Patient expressed interest. Explained scheduling process and went over insurance process, patient verbalized understanding. Will contact patient for scheduling once f/u has been completed.

## 2021-08-21 ENCOUNTER — Ambulatory Visit (HOSPITAL_COMMUNITY): Payer: Medicare HMO

## 2021-08-23 ENCOUNTER — Ambulatory Visit (HOSPITAL_COMMUNITY): Payer: Medicare HMO

## 2021-08-27 ENCOUNTER — Encounter: Payer: Self-pay | Admitting: Student

## 2021-08-27 ENCOUNTER — Inpatient Hospital Stay: Payer: Medicare HMO

## 2021-08-27 ENCOUNTER — Ambulatory Visit: Payer: Medicare HMO | Admitting: Student

## 2021-08-27 ENCOUNTER — Other Ambulatory Visit: Payer: Self-pay

## 2021-08-27 VITALS — BP 132/65 | HR 66 | Temp 98.2°F | Ht 64.0 in | Wt 181.0 lb

## 2021-08-27 DIAGNOSIS — E7800 Pure hypercholesterolemia, unspecified: Secondary | ICD-10-CM

## 2021-08-27 DIAGNOSIS — I10 Essential (primary) hypertension: Secondary | ICD-10-CM

## 2021-08-27 DIAGNOSIS — I214 Non-ST elevation (NSTEMI) myocardial infarction: Secondary | ICD-10-CM

## 2021-08-27 DIAGNOSIS — R002 Palpitations: Secondary | ICD-10-CM

## 2021-08-27 DIAGNOSIS — Z8673 Personal history of transient ischemic attack (TIA), and cerebral infarction without residual deficits: Secondary | ICD-10-CM

## 2021-08-27 MED ORDER — NITROGLYCERIN 0.4 MG SL SUBL: 0.4000 mg | SUBLINGUAL_TABLET | SUBLINGUAL | 3 refills | Status: DC | PRN

## 2021-08-27 MED ORDER — AMLODIPINE BESYLATE 5 MG PO TABS: 5.0000 mg | ORAL_TABLET | Freq: Every day | ORAL | 3 refills | Status: DC

## 2021-08-27 NOTE — Progress Notes (Signed)
Primary Physician/Referring:  Aletha Halim., PA-C  Patient ID: Kelly Warner, female    DOB: 1956-08-22, 65 y.o.   MRN: [id number]  Chief Complaint  Patient presents with   Non-ST elevation (NSTEMI) myocardial infarction    Hypertension   Hospitalization Follow-up   Palpitations   HPI:    Kelly Warner  is a 65 y.o. Caucasian female patient with hypertension, hyperlipidemia, hyperglycemia, history of ischemic stroke in 2018, hypothyroidism and GERD and migraine headaches, obstructive sleep apnea on CPAP presented to [facility] on 04/18/2021 with chest pain.  Cardiac catheterization revealing small vessel disease, recommended medical therapy.  She then presented again 08/13/2021 with chest pain and elevated troponin.  Patient was admitted 08/13/2021 - 08/14/2021 at which time coronary angiogram showed no major epicardial vessel disease, and overall picture was consistent with MI with nonobstructive coronary arteries.  Patient now presents for follow up.  Patient states over the last 2 weeks since discharge her primary concern is generalized fatigue, which is slowly improving.  As well as occasional episodes of palpitations occurring approximately every other day lasting several seconds to an hour.  She describes palpitations as "heart skipping a beat".  She has had no recurrence of chest pain or dyspnea.  Patient was unable to tolerate Imdur due to headache and nausea.  Discharge Summary via Dr. Virgina Jock 08/14/2021: 65 year old Caucasian female with hypertension, hyperlipidemia, OSA, h/o ischemic stroke 2018, known coronary artery disease, medically managed after recent non-STEMI in 04/2021, admitted with left-sided chest pain radiating to left arm, mild troponin elevation   Coronary angiogram showed no major epicardial vessel disease. Branch vessels are very small. I was not able to appreciate any significant disease. Overall, picture consistent w/MINOCA.   Recommend DAPT  for 1 year. Added low dose Imdur  Past Medical History:  Diagnosis Date   GERD (gastroesophageal reflux disease)    Hypertension    Migraines    NSTEMI (non-ST elevation myocardial infarction) ([place])    Stroke [facility])    Past Surgical History:  Procedure Laterality Date   ABDOMINAL HYSTERECTOMY     COLONOSCOPY WITH PROPOFOL N/A 04/19/2020   Procedure: COLONOSCOPY WITH PROPOFOL;  Surgeon: Juanita Craver, MD;  Location: [facility] END[facility];  Service: Endoscopy;  Laterality: N/A;   LEFT HEART CATH AND CORONARY ANGIOGRAPHY N/A 08/14/2021   Procedure: LEFT HEART CATH AND CORONARY ANGIOGRAPHY;  Surgeon: Nigel Mormon, MD;  Location: [place] [facility];  Service: Cardiovascular;  Laterality: N/A;   NASAL FRACTURE SURGERY     POLYPECTOMY  04/19/2020   Procedure: POLYPECTOMY;  Surgeon: Juanita Craver, MD;  Location: [facility] ENDOSCOPY;  Service: Endoscopy;;   SHOULDER ARTHROSCOPY Right    TONSILLECTOMY     Family History  Problem Relation Age of Onset   Atrial fibrillation Mother 71   Hypertension Mother    Heart attack Father 2   Atrial fibrillation Sister 73   Atrial fibrillation Brother 73   Hypertension Brother     Social History   Tobacco Use   Smoking status: Never   Smokeless tobacco: Never  Substance Use Topics   Alcohol use: Yes    Comment: occ   Marital Status: Married  ROS  Review of Systems  Constitutional: Positive for malaise/fatigue. Negative for weight gain.  Cardiovascular:  Negative for chest pain, claudication, dyspnea on exertion, leg swelling, near-syncope, orthopnea, palpitations, paroxysmal nocturnal dyspnea and syncope.  Respiratory:  Negative for shortness of breath.   Gastrointestinal:  Negative for melena.  Neurological:  Negative  for dizziness.  Objective  Blood pressure 132/65, pulse 66, temperature 98.2 F (36.8 C), temperature source Temporal, height $RemoveBeforeDE'5\' 4"'jvwjJcrCNZjqPwz$  (1.626 m), weight 181 lb (82.1 kg), SpO2 97 %. Body mass index is 31.07 kg/m.  Vitals with BMI  08/27/2021 08/14/2021 08/14/2021  Height $Remov'5\' 4"'vCQfFR$  - -  Weight 181 lbs - -  BMI 41.42 - -  Systolic 395 320 233  Diastolic 65 43 65  Pulse 66 76 70     Physical Exam Vitals reviewed.  Neck:     Vascular: No carotid bruit or JVD.  Cardiovascular:     Rate and Rhythm: Normal rate and regular rhythm.     Pulses: Intact distal pulses.     Heart sounds: Normal heart sounds. No murmur heard.   No gallop.     Comments: Right radial access site healing well.  Surrounding ecchymosis without evidence of bruit. Pulmonary:     Effort: Pulmonary effort is normal.     Breath sounds: Normal breath sounds.  Musculoskeletal:     Right lower leg: No edema.     Left lower leg: No edema.     Laboratory examination:   Recent Labs    08/13/21 1130 08/13/21 1957  NA 142  --   K 4.3  --   CL 106  --   CO2 29  --   GLUCOSE 103*  --   BUN 12  --   CREATININE 0.71 0.84  CALCIUM 8.9  --   GFRNONAA >60 >60   estimated creatinine clearance is 68.3 mL/min (by C-G formula based on SCr of 0.84 mg/dL).  CMP Latest Ref Rng & Units 08/13/2021 08/13/2021 03/15/2017  Glucose 70 - 99 mg/dL - 103(H) 114(H)  BUN 8 - 23 mg/dL - 12 16  Creatinine 0.44 - 1.00 mg/dL 0.84 0.71 0.70  Sodium 135 - 145 mmol/L - 142 140  Potassium 3.5 - 5.1 mmol/L - 4.3 3.9  Chloride 98 - 111 mmol/L - 106 103  CO2 22 - 32 mmol/L - 29 -  Calcium 8.9 - 10.3 mg/dL - 8.9 -  Total Protein 6.5 - 8.1 g/dL - - -  Total Bilirubin 0.3 - 1.2 mg/dL - - -  Alkaline Phos 38 - 126 U/L - - -  AST 15 - 41 U/L - - -  ALT 14 - 54 U/L - - -   CBC Latest Ref Rng & Units 08/13/2021 08/13/2021 03/15/2017  WBC 4.0 - 10.5 K/uL 6.7 5.3 -  Hemoglobin 12.0 - 15.0 g/dL 13.1 14.0 15.0  Hematocrit 36.0 - 46.0 % 40.4 43.1 44.0  Platelets 150 - 400 K/uL 261 266 -    Lipid Panel No results for input(s): CHOL, TRIG, LDLCALC, VLDL, HDL, CHOLHDL, LDLDIRECT in the last 8760 hours. Lipid Panel     Component Value Date/Time   CHOL 177 03/16/2017 0307   TRIG  84 03/16/2017 0307   HDL 55 03/16/2017 0307   CHOLHDL 3.2 03/16/2017 0307   VLDL 17 03/16/2017 0307   LDLCALC 105 (H) 03/16/2017 0307     HEMOGLOBIN A1C Lab Results  Component Value Date   HGBA1C 5.8 (H) 03/16/2017   MPG 120 03/16/2017   TSH No results for input(s): TSH in the last 8760 hours.  Labs 04/26/2021:  Apo A1 + B + Ratio 0.3 (0.00-0.6). Lipoprotein (a) <75.0 nmol/L : 21.5     External labs:  04/26/2021: Apo A1 + B + Ratio 0.3 (0.00-0.6). Lipoprotein (a) <75.0 nmol/L : 21.5  Labs 04/23/2021:  TSH normal at 3.04.  A1c 5.9%.  Hb 13.5/HCT 40.9, platelets 206, normal indicis.  Serum glucose 138 mg, BUN 19, creatinine 0.78, EGFR >60 mL.  ALT, AST normal, alkaline phosphatase mildly elevated at 144.  Total cholesterol 135, triglycerides 83, HDL 58, LDL 59.  Allergies   Allergies  Allergen Reactions   Isosorbide Nitrate Other (See Comments)    Headache, nausea and vomiting.   Lactose Intolerance (Gi) Other (See Comments)    GI upset    Medication prior to this encounter:   Outpatient Medications Prior to Visit  Medication Sig Dispense Refill   acetaminophen (TYLENOL) 500 MG tablet Take 500-1,000 mg by mouth every 6 (six) hours as needed for headache.     aspirin EC 81 MG tablet Take 81 mg by mouth at bedtime.      atenolol (TENORMIN) 25 MG tablet Take 12.5 mg by mouth at bedtime. Patient taking differently at 12.5     atorvastatin (LIPITOR) 20 MG tablet Take 20 mg by mouth every evening.     Azelastine-Fluticasone 137-50 MCG/ACT SUSP Place 1 spray into the nose daily. (Patient taking differently: Place 1 spray into the nose daily as needed (allergies).) 23 g 2   citalopram (CELEXA) 20 MG tablet Take 20 mg by mouth at bedtime.      clopidogrel (PLAVIX) 75 MG tablet Take 75 mg by mouth at bedtime.     dexlansoprazole (DEXILANT) 60 MG capsule Take 60 mg by mouth at bedtime.     levothyroxine (SYNTHROID) 25 MCG tablet Take 25 mcg by mouth daily.     mometasone  (NASONEX) 50 MCG/ACT nasal spray Place 2 sprays into the nose daily as needed (congestion).     montelukast (SINGULAIR) 10 MG tablet Take 10 mg by mouth at bedtime.     ondansetron (ZOFRAN) 4 MG tablet Take 1 tablet (4 mg total) by mouth every 8 (eight) hours as needed for nausea or vomiting. (Patient not taking: Reported on 08/27/2021) 20 tablet 3   No facility-administered medications prior to visit.    Medication list after today's encounter   Current Outpatient Medications  Medication Instructions   acetaminophen (TYLENOL) 500-1,000 mg, Oral, Every 6 hours PRN   amLODipine (NORVASC) 5 mg, Oral, Daily   aspirin EC 81 mg, Oral, Daily at bedtime   atenolol (TENORMIN) 12.5 mg, Oral, Daily at bedtime, Patient taking differently at 12.5   atorvastatin (LIPITOR) 20 mg, Oral, Every evening   Azelastine-Fluticasone 137-50 MCG/ACT SUSP 1 spray, Nasal, Daily   citalopram (CELEXA) 20 mg, Oral, Daily at bedtime   clopidogrel (PLAVIX) 75 mg, Oral, Daily at bedtime   dexlansoprazole (DEXILANT) 60 mg, Oral, Daily at bedtime   levothyroxine (SYNTHROID) 25 mcg, Oral, Daily   mometasone (NASONEX) 50 MCG/ACT nasal spray 2 sprays, Nasal, Daily PRN   montelukast (SINGULAIR) 10 mg, Oral, Daily at bedtime   nitroGLYCERIN (NITROSTAT) 0.4 mg, Sublingual, Every 5 min PRN   Radiology:   No results found.  Cardiac Studies:   Holter monitor 03/31/2017: Normal sinus rhythm. No atrial fibrillation or flutter (artifact noted on tracing) No PVC's or PAC's Reported palpiations correlate with normal rhythm. Reassuring monitor  Echocardiogram 04/18/2021:   1. Normal left ventricular cavity size. Mild concentric left ventricular hypertrophy. Normal left ventricular systolic function. LV Ejection Fraction  is approximately: 65 %.  2. Left ventricular diastolic parameters are consistent with mild (Grade I) diastolic dysfunction (impaired relaxation). There are no regional wall motion abnormalities.  3. Normal  right ventricular systolic function.  4. The aortic root is normal in size. The ascending aorta is normal in size.   Coronary angiogram 08/14/2021: LM: Normal LAD: No disease in LAD. Very small diagonal vessels (<1 mm) with no signifiant disease Lcx: No disease in Lcx. Small OM branches (<2 mm), no significant disease RCA: No disease in RCA. Small branches off PDA/PLA, no significant disease   Normal LVEDP Overall presentation c/w MINOCA. Added low dose Imdur, in addition to atenolol. Continue DAPT for a year  EKG:  08/27/2021: Sinus rhythm at a rate of 70 bpm.  Left axis.  Incomplete right bundle branch block.  Bifascicular block.  No evidence of ischemia or underlying injury pattern.  EKG 08/13/2021: Normal sinus rhythm without evidence of ischemia.  Normal EKG.  EKG 04/26/2021: Normal sinus rhythm at rate of 59 bpm, leftward axis, incomplete right bundle branch block.  No evidence of ischemia.    Assessment     ICD-10-CM   1. Non-ST elevation (NSTEMI) myocardial infarction (HCC)  I21.4 EKG 12-Lead    2. H/O arterial ischemic stroke Right coronara radiata 03/15/17  Z86.73     3. Primary hypertension  I10     4. Pure hypercholesterolemia  E78.00     5. Palpitations  R00.2 LONG TERM MONITOR (3-14 DAYS)       Meds ordered this encounter  Medications   amLODipine (NORVASC) 5 MG tablet    Sig: Take 1 tablet (5 mg total) by mouth daily.    Dispense:  30 tablet    Refill:  3   nitroGLYCERIN (NITROSTAT) 0.4 MG SL tablet    Sig: Place 1 tablet (0.4 mg total) under the tongue every 5 (five) minutes as needed for chest pain.    Dispense:  30 tablet    Refill:  3    Orders Placed This Encounter  Procedures   LONG TERM MONITOR (3-14 DAYS)    For palpitations    Standing Status:   Future    Number of Occurrences:   1    Standing Expiration Date:   08/27/2022    Order Specific Question:   Where should this test be performed?    Answer:   [facility]    Order Specific  Question:   Does the patient have an implanted cardiac device?    Answer:   No    Order Specific Question:   Prescribed days of wear    Answer:   7   EKG 12-Lead   Recommendations:   Kelly Warner is a 65 y.o. Caucasian female patient with hypertension, hyperlipidemia, hyperglycemia, history of ischemic stroke in 2018, hypothyroidism and GERD and migraine headaches, obstructive sleep apnea on CPAP presented to [facility] on 04/18/2021 with chest pain.  Cardiac catheterization revealing small vessel disease, recommended medical therapy.  She then presented again 08/13/2021 with chest pain and elevated troponin.  Patient was admitted 08/13/2021 - 08/14/2021 at which time coronary angiogram showed no major epicardial vessel disease, and overall picture was consistent with MI with nonobstructive coronary arteries.  Patient now presents for follow up.  Reviewed left heart catheterization and coronary angiography results with patient, questions were addressed to her satisfaction.  Patient and her husband, who was present at bedside, had multiple questions all of which were addressed accordingly.  We will continue dual antiplatelet therapy for at least 1 year.  As patient was unable to tolerate Imdur we will send for sublingual nitroglycerin. S/L NTG was prescribed and explained how to  and when to use it and to notify us if there is change in frequency of use.  We will continue medical therapy including aspirin and Plavix, atenolol, and atorvastatin.  We will add amlodipine 5 mg p.o. daily for antianginal benefit.  Blood pressure is well controlled.  Lipids are well controlled at last check.  Patient is planning to start cardiac rehab shortly.  Given patient's ongoing palpitations will obtain 1 week cardiac monitor to investigate for underlying cardiac arrhythmias.  Follow-up as previously scheduled in 8 weeks with Dr. Einar Gip.   This was a 50-minute encounter with face-to-face counseling, medical  records review, coordination of care, explanation of complex medical issues, complex medical decision making.     Alethia Berthold, PA-C 08/27/2021, 4:02 PM Office: [phone]

## 2021-09-05 ENCOUNTER — Telehealth: Payer: Self-pay | Admitting: Pulmonary Disease

## 2021-09-13 NOTE — Telephone Encounter (Signed)
Kelly Warner is in process of getting PA on hst.  I called pt and verified insurance is the same and told her we would be calling her to schedule - pt states ok.

## 2021-09-18 ENCOUNTER — Encounter (HOSPITAL_COMMUNITY): Payer: Self-pay

## 2021-09-18 ENCOUNTER — Telehealth (HOSPITAL_COMMUNITY): Payer: Self-pay

## 2021-09-18 NOTE — Telephone Encounter (Signed)
Attempted to call patient in regards to Cardiac Rehab - LM on VM 

## 2021-09-18 NOTE — Telephone Encounter (Signed)
Pt insurance is active and benefits verified through [facility]. Co-pay $10.00, DED $0.00/$0.00 met, out of pocket $3,400.00/$0.00 met, co-insurance 0%. No pre-authorization required. Passport, 09/18/21 @ 10:47AM, HID#[id number]

## 2021-09-20 ENCOUNTER — Telehealth (HOSPITAL_COMMUNITY): Payer: Self-pay

## 2021-09-20 NOTE — Telephone Encounter (Signed)
Pt returned CR phone call, she is interested in participating in the Cardiac Rehab Program. Patient stated yes. Patient will come in for orientation on 10/15/21 @ 10:30AM and will attend the 3PM exercise class.   Tourist information centre manager.

## 2021-10-03 ENCOUNTER — Ambulatory Visit (INDEPENDENT_AMBULATORY_CARE_PROVIDER_SITE_OTHER): Payer: Medicare HMO

## 2021-10-03 ENCOUNTER — Other Ambulatory Visit: Payer: Self-pay

## 2021-10-03 DIAGNOSIS — G4733 Obstructive sleep apnea (adult) (pediatric): Secondary | ICD-10-CM

## 2021-10-04 ENCOUNTER — Telehealth: Payer: Self-pay | Admitting: Cardiology

## 2021-10-04 NOTE — Telephone Encounter (Signed)
Pt req call from nurse, states she never received her monitor results.

## 2021-10-04 NOTE — Telephone Encounter (Signed)
Results have been uploaded for the monitor. Pt is requesting them. Please advise.

## 2021-10-07 NOTE — Telephone Encounter (Signed)
No significant arrhythmias noted on cardiac monitor.  We will discuss further at upcoming office visit.

## 2021-10-07 NOTE — Telephone Encounter (Signed)
Pt called back. Pt voiced understanding. 

## 2021-10-07 NOTE — Telephone Encounter (Signed)
Called pt, no answer. Left vm rq call back.

## 2021-10-07 NOTE — Progress Notes (Signed)
Ambulatory cardiac telemetry 7 days 08/27/2021: Predominant underlying rhythm was sinus with intermittent first-degree AV block.  Patient had PACs and PVCs both with ventricular bigeminy and trigeminy present.  No significant cardiac arrhythmias.  No evidence of atrial fibrillation, high degree AV block, pauses >3 seconds.  Patient events correlated with normal sinus rhythm and PVCs.

## 2021-10-11 ENCOUNTER — Telehealth (HOSPITAL_COMMUNITY): Payer: Self-pay

## 2021-10-11 NOTE — Telephone Encounter (Signed)
Successful telephone encounter to patient to confirm cardiac rehab orientation appointment for 10/15/21 at 10:30 am. Health history completed and all questions answered. Confirmed patient has received packet with directions and cardiac rehab contact information.

## 2021-10-15 ENCOUNTER — Encounter (HOSPITAL_COMMUNITY): Payer: Self-pay

## 2021-10-15 ENCOUNTER — Other Ambulatory Visit: Payer: Self-pay

## 2021-10-15 ENCOUNTER — Encounter (HOSPITAL_COMMUNITY)
Admission: RE | Admit: 2021-10-15 | Discharge: 2021-10-15 | Disposition: A | Discharge status: Home / Self Care | Payer: Medicare HMO | Source: Ambulatory Visit | Attending: Cardiology | Admitting: Cardiology

## 2021-10-15 VITALS — BP 108/70 | HR 68 | Ht 64.25 in | Wt 183.6 lb

## 2021-10-15 DIAGNOSIS — I252 Old myocardial infarction: Secondary | ICD-10-CM | POA: Insufficient documentation

## 2021-10-15 DIAGNOSIS — I214 Non-ST elevation (NSTEMI) myocardial infarction: Secondary | ICD-10-CM

## 2021-10-15 HISTORY — DX: Atherosclerotic heart disease of native coronary artery without angina pectoris: I25.10

## 2021-10-15 NOTE — Progress Notes (Addendum)
Cardiac Individual Treatment Plan  Patient Details  Name: Kelly Warner MRN: [id number] Date of Birth: 01-08-1956 Referring Provider:   Flowsheet Row CARDIAC REHAB PHASE II ORIENTATION from 10/15/2021 in [place]  Referring Provider Adrian Prows, MD       Initial Encounter Date:  [place] PHASE II ORIENTATION from 10/15/2021 in [place]  Date 10/15/21       Visit Diagnosis: NSTEMI 04/18/22 in [place], 08/13/21 Medical Treatment  Patient's Home Medications on Admission:  Current Outpatient Medications:    amLODipine (NORVASC) 5 MG tablet, Take 1 tablet (5 mg total) by mouth daily. (Patient taking differently: Take 5 mg by mouth every evening.), Disp: 30 tablet, Rfl: 3   aspirin EC 81 MG tablet, Take 81 mg by mouth at bedtime. , Disp: , Rfl:    atenolol (TENORMIN) 25 MG tablet, Take 12.5 mg by mouth at bedtime., Disp: , Rfl:    atorvastatin (LIPITOR) 20 MG tablet, Take 20 mg by mouth every evening., Disp: , Rfl:    Chlorpheniramine-DM (CORICIDIN HBP COUGH/COLD PO), Take 1-2 tablets by mouth 3 (three) times daily as needed (cough/cold symptoms.)., Disp: , Rfl:    citalopram (CELEXA) 20 MG tablet, Take 10 mg by mouth at bedtime., Disp: , Rfl:    clopidogrel (PLAVIX) 75 MG tablet, Take 75 mg by mouth at bedtime., Disp: , Rfl:    Coenzyme Q10 (COQ10) 100 MG CAPS, Take 100 mg by mouth every evening., Disp: , Rfl:    dexlansoprazole (DEXILANT) 60 MG capsule, Take 60 mg by mouth at bedtime., Disp: , Rfl:    famotidine (PEPCID) 20 MG tablet, Take 20 mg by mouth daily before lunch., Disp: , Rfl:    levothyroxine (SYNTHROID) 25 MCG tablet, Take 25 mcg by mouth daily before breakfast., Disp: , Rfl:    mometasone (NASONEX) 50 MCG/ACT nasal spray, Place 2 sprays into the nose daily as needed (congestion)., Disp: , Rfl:    nitroGLYCERIN (NITROSTAT) 0.4 MG SL tablet, Place 1 tablet (0.4 mg total) under the  tongue every 5 (five) minutes as needed for chest pain., Disp: 30 tablet, Rfl: 3   Polyethyl Glycol-Propyl Glycol (SYSTANE) 0.4-0.3 % SOLN, Place 1-2 drops into both eyes 3 (three) times daily as needed (dry/irritated eyes.)., Disp: , Rfl:   Past Medical History: Past Medical History:  Diagnosis Date   Coronary artery disease    GERD (gastroesophageal reflux disease)    Hypertension    Migraines    NSTEMI (non-ST elevation myocardial infarction) ([place])    Stroke (HCC)     Tobacco Use: Social History   Tobacco Use  Smoking Status Never  Smokeless Tobacco Never    Labs: Recent Review Flowsheet Data     Labs for ITP Cardiac and Pulmonary Rehab Latest Ref Rng & Units 04/09/2009 03/15/2017 03/16/2017   Cholestrol 0 - 200 mg/dL - - 177   LDLCALC 0 - 99 mg/dL - - 105(H)   HDL >40 mg/dL - - 55   Trlycerides <150 mg/dL - - 84   Hemoglobin A1c 4.8 - 5.6 % - - 5.8(H)   TCO2 0 - 100 mmol/L 26 25 -       Capillary Blood Glucose: Lab Results  Component Value Date   GLUCAP 114 (H) 03/15/2017     Exercise Target Goals: Exercise Program Goal: Individual exercise prescription set using results from initial 6 min walk test and THRR while considering  patients activity  barriers and safety.   Exercise Prescription Goal: Starting with aerobic activity 30 plus minutes a day, 3 days per week for initial exercise prescription. Provide home exercise prescription and guidelines that participant acknowledges understanding prior to discharge.  Activity Barriers & Risk Stratification:  Activity Barriers & Cardiac Risk Stratification - 10/15/21 1251       Activity Barriers & Cardiac Risk Stratification   Activity Barriers Arthritis    Cardiac Risk Stratification High             6 Minute Walk:  6 Minute Walk     Row Name 10/15/21 1143         6 Minute Walk   Phase Initial     Distance 1678 feet     Walk Time 6 minutes     # of Rest Breaks 0     MPH 3.18     METS 3.44      RPE 11     Perceived Dyspnea  1     VO2 Peak 12.04     Symptoms Yes (comment)     Comments SOB, RPD =1     Resting HR 74 bpm     Resting BP 108/76     Resting Oxygen Saturation  96 %     Exercise Oxygen Saturation  during 6 min walk 96 %     Max Ex. HR 88 bpm     Max Ex. BP 136/64     2 Minute Post BP 120/60              Oxygen Initial Assessment:   Oxygen Re-Evaluation:   Oxygen Discharge (Final Oxygen Re-Evaluation):   Initial Exercise Prescription:  Initial Exercise Prescription - 10/15/21 1200       Date of Initial Exercise RX and Referring Provider   Date 10/15/21    Referring Provider Adrian Prows, MD    Expected Discharge Date 12/13/21      Treadmill   MPH 2.7    Grade 1    Minutes 15    METs 3.44      Arm Ergometer   Level 1.5    RPM 50    Minutes 15    METs 2.5             Perform Capillary Blood Glucose checks as needed.  Exercise Prescription Changes:   Exercise Comments:   Exercise Goals and Review:   Exercise Goals     Row Name 10/15/21 1252             Exercise Goals   Increase Physical Activity Yes       Intervention Provide advice, education, support and counseling about physical activity/exercise needs.;Develop an individualized exercise prescription for aerobic and resistive training based on initial evaluation findings, risk stratification, comorbidities and participant's personal goals.       Expected Outcomes Short Term: Attend rehab on a regular basis to increase amount of physical activity.;Long Term: Add in home exercise to make exercise part of routine and to increase amount of physical activity.;Long Term: Exercising regularly at least 3-5 days a week.       Increase Strength and Stamina Yes       Intervention Provide advice, education, support and counseling about physical activity/exercise needs.;Develop an individualized exercise prescription for aerobic and resistive training based on initial evaluation findings,  risk stratification, comorbidities and participant's personal goals.       Expected Outcomes Short Term: Increase workloads from initial exercise prescription for  resistance, speed, and METs.;Short Term: Perform resistance training exercises routinely during rehab and add in resistance training at home;Long Term: Improve cardiorespiratory fitness, muscular endurance and strength as measured by increased METs and functional capacity (6MWT)       Able to understand and use rate of perceived exertion (RPE) scale Yes       Intervention Provide education and explanation on how to use RPE scale       Expected Outcomes Short Term: Able to use RPE daily in rehab to express subjective intensity level;Long Term:  Able to use RPE to guide intensity level when exercising independently       Knowledge and understanding of Target Heart Rate Range (THRR) Yes       Intervention Provide education and explanation of THRR including how the numbers were predicted and where they are located for reference       Expected Outcomes Short Term: Able to state/look up THRR;Short Term: Able to use daily as guideline for intensity in rehab;Long Term: Able to use THRR to govern intensity when exercising independently       Understanding of Exercise Prescription Yes       Intervention Provide education, explanation, and written materials on patient's individual exercise prescription       Expected Outcomes Short Term: Able to explain program exercise prescription;Long Term: Able to explain home exercise prescription to exercise independently                Exercise Goals Re-Evaluation :    Discharge Exercise Prescription (Final Exercise Prescription Changes):   Nutrition:  Target Goals: Understanding of nutrition guidelines, daily intake of sodium 1500mg , cholesterol 200mg , calories 30% from fat and 7% or less from saturated fats, daily to have 5 or more servings of fruits and vegetables.  Biometrics:  Pre Biometrics  - 10/15/21 1045       Pre Biometrics   Waist Circumference 40 inches    Hip Circumference 44 inches    Waist to Hip Ratio 0.91 %    Triceps Skinfold 32 mm    % Body Fat 43.2 %    Grip Strength 23 kg    Flexibility 13 in    Single Leg Stand 30 seconds              Nutrition Therapy Plan and Nutrition Goals:   Nutrition Assessments:  MEDIFICTS Score Key: ?70 Need to make dietary changes  40-70 Heart Healthy Diet ? 40 Therapeutic Level Cholesterol Diet   Picture Your Plate Scores: <76 Unhealthy dietary pattern with much room for improvement. 41-50 Dietary pattern unlikely to meet recommendations for good health and room for improvement. 51-60 More healthful dietary pattern, with some room for improvement.  >60 Healthy dietary pattern, although there may be some specific behaviors that could be improved.    Nutrition Goals Re-Evaluation:   Nutrition Goals Discharge (Final Nutrition Goals Re-Evaluation):   Psychosocial: Target Goals: Acknowledge presence or absence of significant depression and/or stress, maximize coping skills, provide positive support system. Participant is able to verbalize types and ability to use techniques and skills needed for reducing stress and depression.  Initial Review & Psychosocial Screening:  Initial Psych Review & Screening - 10/15/21 1138       Initial Review   Current issues with None Identified      Family Dynamics   Good Support System? Yes   Kelly Warner has her husband and children for support     Barriers   Psychosocial barriers to  participate in program There are no identifiable barriers or psychosocial needs.      Screening Interventions   Interventions Encouraged to exercise             Quality of Life Scores:  Quality of Life - 10/15/21 1258       Quality of Life   Select Quality of Life      Quality of Life Scores   Health/Function Pre 22.7 %    Socioeconomic Pre 26.75 %    Psych/Spiritual Pre 27.71 %     Family Pre 28.8 %    GLOBAL Pre 25.42 %            Scores of 19 and below usually indicate a poorer quality of life in these areas.  A difference of  2-3 points is a clinically meaningful difference.  A difference of 2-3 points in the total score of the Quality of Life Index has been associated with significant improvement in overall quality of life, self-image, physical symptoms, and general health in studies assessing change in quality of life.  PHQ-9: Recent Review Flowsheet Data     Depression screen [facility] 2/9 10/15/2021 10/15/2021 03/11/2018   Decreased Interest 0 0 0   Down, Depressed, Hopeless 0 0 0   PHQ - 2 Score 0 0 0      Interpretation of Total Score  Total Score Depression Severity:  1-4 = Minimal depression, 5-9 = Mild depression, 10-14 = Moderate depression, 15-19 = Moderately severe depression, 20-27 = Severe depression   Psychosocial Evaluation and Intervention:   Psychosocial Re-Evaluation:   Psychosocial Discharge (Final Psychosocial Re-Evaluation):   Vocational Rehabilitation: Provide vocational rehab assistance to qualifying candidates.   Vocational Rehab Evaluation & Intervention:  Vocational Rehab - 10/15/21 1139       Initial Vocational Rehab Evaluation & Intervention   Assessment shows need for Vocational Rehabilitation No   Kelly Warner is retired and does not need vocational rehab at this time            Education: Education Goals: Education classes will be provided on a weekly basis, covering required topics. Participant will state understanding/return demonstration of topics presented.  Learning Barriers/Preferences:  Learning Barriers/Preferences - 10/15/21 1301       Learning Barriers/Preferences   Learning Barriers Sight   wears glasses   Learning Preferences Audio;Written Material;Computer/Internet;Group Instruction;Individual Instruction;Pictoral;Skilled Demonstration;Verbal Instruction             Education  Topics: Hypertension, Hypertension Reduction -Define heart disease and high blood pressure. Discus how high blood pressure affects the body and ways to reduce high blood pressure.   Exercise and Your Heart -Discuss why it is important to exercise, the FITT principles of exercise, normal and abnormal responses to exercise, and how to exercise safely.   Angina -Discuss definition of angina, causes of angina, treatment of angina, and how to decrease risk of having angina.   Cardiac Medications -Review what the following cardiac medications are used for, how they affect the body, and side effects that may occur when taking the medications.  Medications include Aspirin, Beta blockers, calcium channel blockers, ACE Inhibitors, angiotensin receptor blockers, diuretics, digoxin, and antihyperlipidemics.   Congestive Heart Failure -Discuss the definition of CHF, how to live with CHF, the signs and symptoms of CHF, and how keep track of weight and sodium intake.   Heart Disease and Intimacy -Discus the effect sexual activity has on the heart, how changes occur during intimacy as we age, and safety during  sexual activity.   Smoking Cessation / COPD -Discuss different methods to quit smoking, the health benefits of quitting smoking, and the definition of COPD.   Nutrition I: Fats -Discuss the types of cholesterol, what cholesterol does to the heart, and how cholesterol levels can be controlled.   Nutrition II: Labels -Discuss the different components of food labels and how to read food label   Heart Parts/Heart Disease and PAD -Discuss the anatomy of the heart, the pathway of blood circulation through the heart, and these are affected by heart disease.   Stress I: Signs and Symptoms -Discuss the causes of stress, how stress may lead to anxiety and depression, and ways to limit stress.   Stress II: Relaxation -Discuss different types of relaxation techniques to limit  stress.   Warning Signs of Stroke / TIA -Discuss definition of a stroke, what the signs and symptoms are of a stroke, and how to identify when someone is having stroke.   Knowledge Questionnaire Score:  Knowledge Questionnaire Score - 10/15/21 1259       Knowledge Questionnaire Score   Pre Score 22/24             Core Components/Risk Factors/Patient Goals at Admission:  Personal Goals and Risk Factors at Admission - 10/15/21 1300       Core Components/Risk Factors/Patient Goals on Admission    Weight Management Yes;Obesity;Weight Loss    Intervention Weight Management: Develop a combined nutrition and exercise program designed to reach desired caloric intake, while maintaining appropriate intake of nutrient and fiber, sodium and fats, and appropriate energy expenditure required for the weight goal.;Weight Management: Provide education and appropriate resources to help participant work on and attain dietary goals.;Weight Management/Obesity: Establish reasonable short term and long term weight goals.;Obesity: Provide education and appropriate resources to help participant work on and attain dietary goals.    Admit Weight 183 lb 10.3 oz (83.3 kg)    Expected Outcomes Long Term: Adherence to nutrition and physical activity/exercise program aimed toward attainment of established weight goal;Short Term: Continue to assess and modify interventions until short term weight is achieved;Weight Maintenance: Understanding of the daily nutrition guidelines, which includes 25-35% calories from fat, 7% or less cal from saturated fats, less than 200mg  cholesterol, less than 1.5gm of sodium, & 5 or more servings of fruits and vegetables daily;Weight Loss: Understanding of general recommendations for a balanced deficit meal plan, which promotes 1-2 lb weight loss per week and includes a negative energy balance of [phone] kcal/d;Understanding recommendations for meals to include 15-35% energy as protein,  25-35% energy from fat, 35-60% energy from carbohydrates, less than 200mg  of dietary cholesterol, 20-35 gm of total fiber daily;Understanding of distribution of calorie intake throughout the day with the consumption of 4-5 meals/snacks    Hypertension Yes    Intervention Provide education on lifestyle modifcations including regular physical activity/exercise, weight management, moderate sodium restriction and increased consumption of fresh fruit, vegetables, and low fat dairy, alcohol moderation, and smoking cessation.;Monitor prescription use compliance.    Expected Outcomes Short Term: Continued assessment and intervention until BP is < 140/35mm HG in hypertensive participants. < 130/67mm HG in hypertensive participants with diabetes, heart failure or chronic kidney disease.;Long Term: Maintenance of blood pressure at goal levels.    Lipids Yes    Intervention Provide education and support for participant on nutrition & aerobic/resistive exercise along with prescribed medications to achieve LDL 70mg , HDL >40mg .    Expected Outcomes Short Term: Participant states understanding of desired cholesterol  values and is compliant with medications prescribed. Participant is following exercise prescription and nutrition guidelines.;Long Term: Cholesterol controlled with medications as prescribed, with individualized exercise RX and with personalized nutrition plan. Value goals: LDL < 70mg , HDL > 40 mg.             Core Components/Risk Factors/Patient Goals Review:    Core Components/Risk Factors/Patient Goals at Discharge (Final Review):    ITP Comments:  ITP Comments     Row Name 10/15/21 1132           ITP Comments Dr Fransico Him MD, Medical Director                Comments: [place] Patient attended orientation on 10/15/2021 to review rules and guidelines for program.  Completed 6 minute walk test, Intitial ITP, and exercise prescription.  VSS. Telemetry-Sinus Rhythm tall t wave intermittent   PVC's. Kelly Warner did report some mild shortness of breath otherwise,  Asymptomatic.  Kelly Warner has a history of PVC's. Will fax ECG tracings from 6 minute walk test  for  Dr. Irven Shelling office for review Safety measures and social distancing in place per CDC guidelines. Barnet Pall, RN,BSN 10/15/2021 2:15 PM

## 2021-10-15 NOTE — Progress Notes (Signed)
Cardiac Rehab Medication Review by a Nurse  Does the patient  feel that his/her medications are working for him/her?  yes  Has the patient been experiencing any side effects to the medications prescribed?  no  Does the patient measure his/her own blood pressure or blood glucose at home?  yes   Does the patient have any problems obtaining medications due to transportation or finances?   no  Understanding of regimen: excellent Understanding of indications: excellent Potential of compliance: excellent    Nurse comments: Celeste is taking her medications as prescribed. Maricia has a BP cuff monitor. Ajanee checks her blood pressures on a regular basis    Harrell Gave RN 10/15/2021 11:32 AM

## 2021-10-18 ENCOUNTER — Telehealth: Payer: Self-pay | Admitting: Pulmonary Disease

## 2021-10-18 DIAGNOSIS — G4733 Obstructive sleep apnea (adult) (pediatric): Secondary | ICD-10-CM

## 2021-10-18 NOTE — Telephone Encounter (Signed)
Patient is requesting sleep study results from 10/03/2021.  PCC's can you guys give the study to another sleep provider to be read since Dr. Ander Slade is unavailable?

## 2021-10-18 NOTE — Telephone Encounter (Signed)
Pt is aware that we will have a provider look at her study next week.

## 2021-10-21 ENCOUNTER — Other Ambulatory Visit: Payer: Self-pay

## 2021-10-21 ENCOUNTER — Encounter (HOSPITAL_COMMUNITY)
Admission: RE | Admit: 2021-10-21 | Discharge: 2021-10-21 | Disposition: A | Discharge status: Home / Self Care | Payer: Medicare HMO | Source: Ambulatory Visit | Attending: Cardiology | Admitting: Cardiology

## 2021-10-21 DIAGNOSIS — I214 Non-ST elevation (NSTEMI) myocardial infarction: Secondary | ICD-10-CM

## 2021-10-21 DIAGNOSIS — I252 Old myocardial infarction: Secondary | ICD-10-CM | POA: Diagnosis not present

## 2021-10-21 NOTE — Progress Notes (Signed)
Daily Session Note  Patient Details  Name: Kelly Warner MRN: [id number] Date of Birth: May 17, 1956 Referring Provider:   Flowsheet Row CARDIAC REHAB PHASE II ORIENTATION from 10/15/2021 in [place]  Referring Provider Adrian Prows, MD       Encounter Date: 10/21/2021  Check In:  Session Check In - 10/21/21 1512       Check-In   Supervising physician immediately available to respond to emergencies [facility] immediately available    Physician(s) Dr. Doristine Bosworth    Location [facility]    Staff Present Barnet Pall, RN, Milus Glazier, MS, ACSM-CEP, CCRP, Exercise Physiologist;Jetta Gilford Rile BS, ACSM EP-C, Exercise Physiologist;Olinty Brandon, MS, ACSM CEP, Exercise Physiologist    Virtual Visit No    Medication changes reported     No    Fall or balance concerns reported    No    Tobacco Cessation No Change    Current number of cigarettes/nicotine per day     0    Warm-up and Cool-down Performed as group-led instruction    Resistance Training Performed Yes    VAD Patient? No    PAD/SET Patient? No      Pain Assessment   Currently in Pain? No/denies    Pain Score 0-No pain    Multiple Pain Sites No             Capillary Blood Glucose: No results found for this or any previous visit (from the past 24 hour(s)).   Exercise Prescription Changes - 10/21/21 1703       Response to Exercise   Blood Pressure (Admit) 124/90    Blood Pressure (Exercise) 138/84    Blood Pressure (Exit) 108/70    Heart Rate (Admit) 71 bpm    Heart Rate (Exercise) 117 bpm    Heart Rate (Exit) 80 bpm    Rating of Perceived Exertion (Exercise) 11.5    Perceived Dyspnea (Exercise) 0    Symptoms 0    Comments Pt first day in teh CRP2 program    Duration Progress to 30 minutes of  aerobic without signs/symptoms of physical distress    Intensity THRR unchanged      Progression   Progression Continue to progress workloads to maintain  intensity without signs/symptoms of physical distress.    Average METs 2.65      Resistance Training   Weight 3 lbs    Reps 10-15    Time 10 Minutes      Treadmill   MPH 2.7    Grade 1    Minutes 15    METs 3.44      Arm Ergometer   Level 1.5    RPM 50    Minutes 15    METs 1.9             Social History   Tobacco Use  Smoking Status Never  Smokeless Tobacco Never    Goals Met:  Exercise tolerated well No report of concerns or symptoms today Strength training completed today  Goals Unmet:  Not Applicable  Comments: [name] started cardiac rehab today.  Pt tolerated light exercise without difficulty. VSS, telemetry-Sinus Rhythm, occasional PVC, asymptomatic.  Medication list reconciled. Pt denies barriers to medicaiton compliance.  PSYCHOSOCIAL ASSESSMENT:  PHQ-0. Pt exhibits positive coping skills, hopeful outlook with supportive family. No psychosocial needs identified at this time, no psychosocial interventions necessary.    Pt enjoys gardening, reading, spending time with her grandchildren, dog and travelling.Marland Kitchen  Pt oriented to exercise equipment and routine.    Understanding verbalized. Patient was given a dietary packet.Maria Whitaker, RN,BSN °10/22/2021 7:51 AM  ° ° °Dr. Traci Turner is Medical Director for Cardiac Rehab at [facility]. °

## 2021-10-22 NOTE — Telephone Encounter (Signed)
Only 45 mins of sleep was recorded Please repeat study

## 2021-10-22 NOTE — Telephone Encounter (Signed)
LMTCB for the pt and HST order pending until we confirm she is okay redoing the study.

## 2021-10-23 ENCOUNTER — Ambulatory Visit: Payer: Medicare HMO | Admitting: Cardiology

## 2021-10-23 ENCOUNTER — Other Ambulatory Visit: Payer: Self-pay

## 2021-10-23 ENCOUNTER — Encounter (HOSPITAL_COMMUNITY)
Admission: RE | Admit: 2021-10-23 | Discharge: 2021-10-23 | Disposition: A | Discharge status: Home / Self Care | Payer: Medicare HMO | Source: Ambulatory Visit | Attending: Cardiology | Admitting: Cardiology

## 2021-10-23 ENCOUNTER — Encounter: Payer: Self-pay | Admitting: Cardiology

## 2021-10-23 VITALS — BP 108/70 | HR 62 | Temp 98.2°F | Resp 16 | Ht 64.0 in | Wt 181.6 lb

## 2021-10-23 DIAGNOSIS — I214 Non-ST elevation (NSTEMI) myocardial infarction: Secondary | ICD-10-CM

## 2021-10-23 DIAGNOSIS — G4733 Obstructive sleep apnea (adult) (pediatric): Secondary | ICD-10-CM

## 2021-10-23 DIAGNOSIS — I252 Old myocardial infarction: Secondary | ICD-10-CM | POA: Diagnosis not present

## 2021-10-23 DIAGNOSIS — I219 Acute myocardial infarction, unspecified: Secondary | ICD-10-CM

## 2021-10-23 DIAGNOSIS — I10 Essential (primary) hypertension: Secondary | ICD-10-CM

## 2021-10-23 DIAGNOSIS — E7800 Pure hypercholesterolemia, unspecified: Secondary | ICD-10-CM

## 2021-10-23 MED ORDER — CARVEDILOL 12.5 MG PO TABS: 12.5000 mg | ORAL_TABLET | Freq: Two times a day (BID) | ORAL | 3 refills | Status: DC

## 2021-10-23 NOTE — Progress Notes (Signed)
Primary Physician/Referring:  Aletha Halim., PA-C  Patient ID: Kelly Warner, female    DOB: Oct 20, 1955, 66 y.o.   MRN: [id number]  Chief Complaint  Patient presents with   Coronary Artery Disease   Follow-up    6 months   HPI:    Kelly Warner  is a 66 y.o. Caucasian female patient with hypertension, hyperlipidemia, hyperglycemia, history of ischemic stroke in 2018, hypothyroidism and GERD and migraine headaches, obstructive sleep apnea on CPAP presented to [facility] on 04/18/2021 with chest pain and positive troponins, Cardiac catheterization revealing very mild CAD, small vessel disease, recommended medical therapy, overall picture was consistent with MI with nonobstructive coronary arteries.  Due to palpitations she underwent extended EKG monitoring and posthospital follow-up.  Fortunately she has not had any further chest pain.  She is tolerating DAPT.  Continues to have palpitations and thinks that her symptoms got worse since she went on amlodipine.  Past Medical History:  Diagnosis Date   Coronary artery disease    GERD (gastroesophageal reflux disease)    Hypertension    Migraines    NSTEMI (non-ST elevation myocardial infarction) ([place])    Stroke [facility])    Past Surgical History:  Procedure Laterality Date   ABDOMINAL HYSTERECTOMY     CARDIAC CATHETERIZATION     COLONOSCOPY WITH PROPOFOL N/A 04/19/2020   Procedure: COLONOSCOPY WITH PROPOFOL;  Surgeon: Juanita Craver, MD;  Location: [facility];  Service: Endoscopy;  Laterality: N/A;   LEFT HEART CATH AND CORONARY ANGIOGRAPHY N/A 08/14/2021   Procedure: LEFT HEART CATH AND CORONARY ANGIOGRAPHY;  Surgeon: Nigel Mormon, MD;  Location: [place] [facility];  Service: Cardiovascular;  Laterality: N/A;   NASAL FRACTURE SURGERY     POLYPECTOMY  04/19/2020   Procedure: POLYPECTOMY;  Surgeon: Juanita Craver, MD;  Location: [facility];  Service: Endoscopy;;   SHOULDER ARTHROSCOPY Right    TONSILLECTOMY      Family History  Problem Relation Age of Onset   Atrial fibrillation Mother 104   Hypertension Mother    Heart attack Father 30   Atrial fibrillation Sister 61   Atrial fibrillation Brother 52   Hypertension Brother     Social History   Tobacco Use   Smoking status: Never   Smokeless tobacco: Never  Substance Use Topics   Alcohol use: Yes    Comment: occasionally   Marital Status: Married  ROS  Review of Systems  Cardiovascular:  Positive for palpitations. Negative for chest pain, dyspnea on exertion and leg swelling.  Objective  Blood pressure 108/70, pulse 62, temperature 98.2 F (36.8 C), temperature source Temporal, resp. rate 16, height _0  (1.626 m), weight 181 lb 9.6 oz (82.4 kg), SpO2 97 %. Body mass index is 31.17 kg/m.  Vitals with BMI 10/23/2021 10/15/2021 08/27/2021  Height _1  5' 4.25" _2   Weight 181 lbs 10 oz 183 lbs 10 oz 181 lbs  BMI 31.16 82.99 37.16  Systolic 967 893 810  Diastolic 70 70 65  Pulse 62 68 66     Physical Exam Neck:     Vascular: No carotid bruit or JVD.  Cardiovascular:     Rate and Rhythm: Normal rate and regular rhythm.     Pulses: Intact distal pulses.     Heart sounds: Normal heart sounds. No murmur heard.   No gallop.  Pulmonary:     Effort: Pulmonary effort is normal.     Breath sounds: Normal breath sounds.  Musculoskeletal:  Right lower leg: No edema.     Left lower leg: No edema.     Laboratory examination:   Recent Labs    08/13/21 1130 08/13/21 1957  NA 142  --   K 4.3  --   CL 106  --   CO2 29  --   GLUCOSE 103*  --   BUN 12  --   CREATININE 0.71 0.84  CALCIUM 8.9  --   GFRNONAA >60 >60   CrCl cannot be calculated (Patient's most recent lab result is older than the maximum 21 days allowed.).  CMP Latest Ref Rng & Units 08/13/2021 08/13/2021 03/15/2017  Glucose 70 - 99 mg/dL - 103(H) 114(H)  BUN 8 - 23 mg/dL - 12 16  Creatinine 0.44 - 1.00 mg/dL 0.84 0.71 0.70  Sodium 135 - 145 mmol/L - 142 140   Potassium 3.5 - 5.1 mmol/L - 4.3 3.9  Chloride 98 - 111 mmol/L - 106 103  CO2 22 - 32 mmol/L - 29 -  Calcium 8.9 - 10.3 mg/dL - 8.9 -  Total Protein 6.5 - 8.1 g/dL - - -  Total Bilirubin 0.3 - 1.2 mg/dL - - -  Alkaline Phos 38 - 126 U/L - - -  AST 15 - 41 U/L - - -  ALT 14 - 54 U/L - - -   CBC Latest Ref Rng & Units 08/13/2021 08/13/2021 03/15/2017  WBC 4.0 - 10.5 K/uL 6.7 5.3 -  Hemoglobin 12.0 - 15.0 g/dL 13.1 14.0 15.0  Hematocrit 36.0 - 46.0 % 40.4 43.1 44.0  Platelets 150 - 400 K/uL 261 266 -   External Labs:  Labs 04/26/2021:  Apo A1 + B + Ratio 0.3 (0.00-0.6). Lipoprotein (a) <75.0 nmol/L : 21.5     External labs:  04/26/2021: Apo A1 + B + Ratio 0.3 (0.00-0.6). Lipoprotein (a) <75.0 nmol/L : 21.5    Labs 04/23/2021:  TSH normal at 3.04.  A1c 5.9%.  Hb 13.5/HCT 40.9, platelets 206, normal indicis.  Serum glucose 138 mg, BUN 19, creatinine 0.78, EGFR >60 mL.  ALT, AST normal, alkaline phosphatase mildly elevated at 144.  Total cholesterol 135, triglycerides 83, HDL 58, LDL 59.  Allergies   Allergies  Allergen Reactions   Isosorbide Nitrate Other (See Comments)    Headache, nausea and vomiting.   Lactose Intolerance (Gi) Other (See Comments)    GI upset    Medication list after today's encounter    Current Outpatient Medications:    aspirin EC 81 MG tablet, Take 81 mg by mouth at bedtime. , Disp: , Rfl:    atorvastatin (LIPITOR) 20 MG tablet, Take 20 mg by mouth every evening., Disp: , Rfl:    carvedilol (COREG) 12.5 MG tablet, Take 1 tablet (12.5 mg total) by mouth 2 (two) times daily., Disp: 180 tablet, Rfl: 3   citalopram (CELEXA) 10 MG/5ML suspension, Take 10 mg by mouth at bedtime., Disp: , Rfl:    clopidogrel (PLAVIX) 75 MG tablet, Take 75 mg by mouth at bedtime., Disp: , Rfl:    Coenzyme Q10 (COQ10) 100 MG CAPS, Take 100 mg by mouth every evening., Disp: , Rfl:    dexlansoprazole (DEXILANT) 60 MG capsule, Take 60 mg by mouth at bedtime., Disp: , Rfl:     famotidine (PEPCID) 20 MG tablet, Take 20 mg by mouth daily before lunch., Disp: , Rfl:    levothyroxine (SYNTHROID) 25 MCG tablet, Take 25 mcg by mouth daily before breakfast., Disp: , Rfl:  mometasone (NASONEX) 50 MCG/ACT nasal spray, Place 2 sprays into the nose daily as needed (congestion)., Disp: , Rfl:    nitroGLYCERIN (NITROSTAT) 0.4 MG SL tablet, Place 1 tablet (0.4 mg total) under the tongue every 5 (five) minutes as needed for chest pain., Disp: 30 tablet, Rfl: 3   Polyethyl Glycol-Propyl Glycol (SYSTANE) 0.4-0.3 % SOLN, Place 1-2 drops into both eyes 3 (three) times daily as needed (dry/irritated eyes.)., Disp: , Rfl:   Radiology:   No results found.  Cardiac Studies:   Echocardiogram 04/18/2021:   1. Normal left ventricular cavity size. Mild concentric left ventricular hypertrophy. Normal left ventricular systolic function. LV Ejection Fraction  is approximately: 65 %.  2. Left ventricular diastolic parameters are consistent with mild (Grade I) diastolic dysfunction (impaired relaxation). There are no regional wall motion abnormalities.  3. Normal right ventricular systolic function.  4. The aortic root is normal in size. The ascending aorta is normal in size.   Coronary angiogram 08/14/2021: LM: Normal LAD: No disease in LAD. Very small diagonal vessels (<1 mm) with no signifiant disease Lcx: No disease in Lcx. Small OM branches (<2 mm), no significant disease RCA: No disease in RCA. Small branches off PDA/PLA, no significant disease   Normal LVEDP Overall presentation c/w MINOCA. Added low dose Imdur, in addition to atenolol. Continue DAPT for a year   Ambulatory cardiac telemetry 7 days 08/27/2021: Predominant underlying rhythm was sinus with intermittent first-degree AV block.  Patient had PACs and PVCs both with ventricular bigeminy and trigeminy present.  No significant cardiac arrhythmias.  No evidence of atrial fibrillation, high degree AV block, pauses >3  seconds.  Patient events correlated with normal sinus rhythm and PVCs. EKG:   EKG 10/23/2021: Sinus rhythm with borderline first-degree AV block at a rate of 62 bpm, incomplete right bundle branch block.  Otherwise normal EKG. no significant change from 08/27/2021.   Assessment     ICD-10-CM   1. Myocardial infarction with nonobstructive coronary arteries (HCC)  I21.9 carvedilol (COREG) 12.5 MG tablet    2. Primary hypertension  I10 EKG 12-Lead    carvedilol (COREG) 12.5 MG tablet    3. Pure hypercholesterolemia  E78.00        Meds ordered this encounter  Medications   carvedilol (COREG) 12.5 MG tablet    Sig: Take 1 tablet (12.5 mg total) by mouth 2 (two) times daily.    Dispense:  180 tablet    Refill:  3    Discontinue amlodipine, atenolol    Orders Placed This Encounter  Procedures   EKG 12-Lead   Recommendations:   Kelly Warner is a 66 y.o. Caucasian female patient with hypertension, hyperlipidemia, hyperglycemia, history of ischemic stroke in 2018, hypothyroidism and GERD and migraine headaches, obstructive sleep apnea on CPAP presented to [facility] on 04/18/[facility]21 with chest pain and positive troponins, Cardiac catheterization revealing very mild CAD, small vessel disease, recommended medical therapy, overall picture was consistent with MI with nonobstructive coronary arteries.  I have again personally reviewed the coronary angiograms with the patient and short of the images.  She has no significant coronary disease.  Discussed myocardial infarction and normal coronary arteries.  I also discussed with her regarding lifestyle changes that are necessary along with medication changes.  With regard to palpitations, symptoms correlate with PVCs.  She feels amlodipine has made PVCs worse, I will discontinue both amlodipine and also atenolol 12.5 mg daily and switch her to carvedilol 12.5 mg twice daily for both  hypertension, palpitations.  I would like to see her back  in 3 months and if she remains stable I will see him back in a year.  Lipids are at goal.     Adrian Prows, PA-C 10/23/2021, 9:30 AM Office: [phone]

## 2021-10-23 NOTE — Telephone Encounter (Signed)
HST ordered.  Nothing further needed.

## 2021-10-24 ENCOUNTER — Telehealth: Payer: Self-pay | Admitting: Pulmonary Disease

## 2021-10-24 DIAGNOSIS — G4733 Obstructive sleep apnea (adult) (pediatric): Secondary | ICD-10-CM | POA: Diagnosis not present

## 2021-10-24 NOTE — Telephone Encounter (Signed)
Patient of Dr. Ander Slade.  Home sleep study from 10/23/21 showed moderate obstructive sleep apnea with an AHI of 24.5 and SpO2 low of 84%.  Please schedule ROV with Dr. Ander Slade or a NP to review tests results and treatment options.

## 2021-10-24 NOTE — Telephone Encounter (Signed)
Spoke to patient and relayed results.  Patient has pending appt for 11/25/2021. I offered sooner appt with NP. She would like to keep scheduled visit with Dr. Burnett Harry.  Nothing further needed at this time.

## 2021-10-25 ENCOUNTER — Other Ambulatory Visit: Payer: Self-pay

## 2021-10-25 ENCOUNTER — Encounter (HOSPITAL_COMMUNITY)
Admission: RE | Admit: 2021-10-25 | Discharge: 2021-10-25 | Disposition: A | Discharge status: Home / Self Care | Payer: Medicare HMO | Source: Ambulatory Visit | Attending: Cardiology | Admitting: Cardiology

## 2021-10-25 DIAGNOSIS — I214 Non-ST elevation (NSTEMI) myocardial infarction: Secondary | ICD-10-CM

## 2021-10-25 DIAGNOSIS — I252 Old myocardial infarction: Secondary | ICD-10-CM | POA: Diagnosis not present

## 2021-10-28 ENCOUNTER — Encounter (HOSPITAL_COMMUNITY)
Admission: RE | Admit: 2021-10-28 | Discharge: 2021-10-28 | Disposition: A | Discharge status: Home / Self Care | Payer: Medicare HMO | Source: Ambulatory Visit | Attending: Cardiology | Admitting: Cardiology

## 2021-10-28 ENCOUNTER — Other Ambulatory Visit: Payer: Self-pay

## 2021-10-28 DIAGNOSIS — I214 Non-ST elevation (NSTEMI) myocardial infarction: Secondary | ICD-10-CM

## 2021-10-28 DIAGNOSIS — I252 Old myocardial infarction: Secondary | ICD-10-CM | POA: Diagnosis not present

## 2021-10-30 ENCOUNTER — Other Ambulatory Visit: Payer: Self-pay

## 2021-10-30 ENCOUNTER — Encounter (HOSPITAL_COMMUNITY)
Admission: RE | Admit: 2021-10-30 | Discharge: 2021-10-30 | Disposition: A | Discharge status: Home / Self Care | Payer: Medicare HMO | Source: Ambulatory Visit | Attending: Cardiology | Admitting: Cardiology

## 2021-10-30 DIAGNOSIS — I214 Non-ST elevation (NSTEMI) myocardial infarction: Secondary | ICD-10-CM | POA: Diagnosis present

## 2021-11-01 ENCOUNTER — Encounter (HOSPITAL_COMMUNITY)
Admission: RE | Admit: 2021-11-01 | Discharge: 2021-11-01 | Disposition: A | Discharge status: Home / Self Care | Payer: Medicare HMO | Source: Ambulatory Visit | Attending: Cardiology | Admitting: Cardiology

## 2021-11-01 ENCOUNTER — Other Ambulatory Visit: Payer: Self-pay

## 2021-11-01 DIAGNOSIS — I214 Non-ST elevation (NSTEMI) myocardial infarction: Secondary | ICD-10-CM

## 2021-11-04 ENCOUNTER — Other Ambulatory Visit: Payer: Self-pay

## 2021-11-04 ENCOUNTER — Encounter (HOSPITAL_COMMUNITY)
Admission: RE | Admit: 2021-11-04 | Discharge: 2021-11-04 | Disposition: A | Discharge status: Home / Self Care | Payer: Medicare HMO | Source: Ambulatory Visit | Attending: Cardiology | Admitting: Cardiology

## 2021-11-04 DIAGNOSIS — I214 Non-ST elevation (NSTEMI) myocardial infarction: Secondary | ICD-10-CM

## 2021-11-06 ENCOUNTER — Encounter (HOSPITAL_COMMUNITY): Payer: Medicare HMO

## 2021-11-08 ENCOUNTER — Other Ambulatory Visit: Payer: Self-pay

## 2021-11-08 ENCOUNTER — Encounter (HOSPITAL_COMMUNITY)
Admission: RE | Admit: 2021-11-08 | Discharge: 2021-11-08 | Disposition: A | Discharge status: Home / Self Care | Payer: Medicare HMO | Source: Ambulatory Visit | Attending: Cardiology | Admitting: Cardiology

## 2021-11-08 DIAGNOSIS — I214 Non-ST elevation (NSTEMI) myocardial infarction: Secondary | ICD-10-CM

## 2021-11-11 ENCOUNTER — Telehealth: Payer: Self-pay | Admitting: Cardiology

## 2021-11-11 ENCOUNTER — Encounter (HOSPITAL_COMMUNITY): Payer: Medicare HMO

## 2021-11-11 ENCOUNTER — Telehealth (HOSPITAL_COMMUNITY): Payer: Self-pay | Admitting: Family Medicine

## 2021-11-11 DIAGNOSIS — I10 Essential (primary) hypertension: Secondary | ICD-10-CM

## 2021-11-11 MED ORDER — ATENOLOL 25 MG PO TABS: 25.0000 mg | ORAL_TABLET | Freq: Every day | ORAL | 3 refills | Status: DC

## 2021-11-11 NOTE — Telephone Encounter (Signed)
Patient called and stated that by taking carvedilol, she has noticed fatigue and also severe headaches and change in urinary frequency.  She had tolerated atenolol well previously.  Hence carvedilol discontinued and switched her back to atenolol. ? ?  ICD-10-CM   ?1. Primary hypertension  I10 atenolol (TENORMIN) 25 MG tablet  ?  ? ?Meds ordered this encounter  ?Medications  ? atenolol (TENORMIN) 25 MG tablet  ?  Sig: Take 1 tablet (25 mg total) by mouth daily.  ?  Dispense:  180 tablet  ?  Refill:  3  ? ?Medications Discontinued During This Encounter  ?Medication Reason  ? carvedilol (COREG) 12.5 MG tablet Side effect (s)  ?  ?

## 2021-11-12 ENCOUNTER — Telehealth: Payer: Self-pay | Admitting: Student

## 2021-11-12 NOTE — Progress Notes (Signed)
Cardiac Individual Treatment Plan ? ?Patient Details  ?Name: Kelly Warner ?MRN: [id number] ?Date of Birth: May 23, 1956 ?Referring Provider:   ?Flowsheet Row CARDIAC REHAB PHASE II ORIENTATION from 10/15/2021 in [facility]  ?Referring Provider Adrian Prows, MD  ? ?  ? ? ?Initial Encounter Date:  ?Flowsheet Row CARDIAC REHAB PHASE II ORIENTATION from 10/15/2021 in [facility]  ?Date 10/15/21  ? ?  ? ? ?Visit Diagnosis: NSTEMI 04/18/22 in [place], 08/13/21 Medical Treatment ? ?Patient's Home Medications on Admission: ? ?Current Outpatient Medications:  ?  atenolol (TENORMIN) 25 MG tablet, Take 1 tablet (25 mg total) by mouth daily., Disp: 180 tablet, Rfl: 3 ?  aspirin EC 81 MG tablet, Take 81 mg by mouth at bedtime. , Disp: , Rfl:  ?  atorvastatin (LIPITOR) 20 MG tablet, Take 20 mg by mouth every evening., Disp: , Rfl:  ?  citalopram (CELEXA) 10 MG/5ML suspension, Take 10 mg by mouth at bedtime., Disp: , Rfl:  ?  clopidogrel (PLAVIX) 75 MG tablet, Take 75 mg by mouth at bedtime., Disp: , Rfl:  ?  Coenzyme Q10 (COQ10) 100 MG CAPS, Take 100 mg by mouth every evening., Disp: , Rfl:  ?  dexlansoprazole (DEXILANT) 60 MG capsule, Take 60 mg by mouth at bedtime., Disp: , Rfl:  ?  famotidine (PEPCID) 20 MG tablet, Take 20 mg by mouth daily before lunch., Disp: , Rfl:  ?  levothyroxine (SYNTHROID) 25 MCG tablet, Take 25 mcg by mouth daily before breakfast., Disp: , Rfl:  ?  mometasone (NASONEX) 50 MCG/ACT nasal spray, Place 2 sprays into the nose daily as needed (congestion)., Disp: , Rfl:  ?  nitroGLYCERIN (NITROSTAT) 0.4 MG SL tablet, Place 1 tablet (0.4 mg total) under the tongue every 5 (five) minutes as needed for chest pain., Disp: 30 tablet, Rfl: 3 ?  Polyethyl Glycol-Propyl Glycol (SYSTANE) 0.4-0.3 % SOLN, Place 1-2 drops into both eyes 3 (three) times daily as needed (dry/irritated eyes.)., Disp: , Rfl:  ? ?Past Medical History: ?Past Medical History:   ?Diagnosis Date  ? Coronary artery disease   ? GERD (gastroesophageal reflux disease)   ? Hypertension   ? Migraines   ? NSTEMI (non-ST elevation myocardial infarction) ([place])   ? Stroke [facility])   ? ? ?Tobacco Use: ?Social History  ? ?Tobacco Use  ?Smoking Status Never  ?Smokeless Tobacco Never  ? ? ?Labs: ?Recent Review Flowsheet Data   ? ? Labs for ITP Cardiac and Pulmonary Rehab Latest Ref Rng & Units 04/09/2009 03/15/2017 03/16/2017  ? Cholestrol 0 - 200 mg/dL - - 177  ? LDLCALC 0 - 99 mg/dL - - 105(H)  ? HDL >40 mg/dL - - 55  ? Trlycerides <150 mg/dL - - 84  ? Hemoglobin A1c 4.8 - 5.6 % - - 5.8(H)  ? TCO2 0 - 100 mmol/L 26 25 -  ? ?  ? ? ?Capillary Blood Glucose: ?Lab Results  ?Component Value Date  ? GLUCAP 114 (H) 03/15/2017  ? ? ? ?Exercise Target Goals: ?Exercise Program Goal: ?Individual exercise prescription set using results from initial 6 min walk test and THRR while considering  patient?s activity barriers and safety.  ? ?Exercise Prescription Goal: ?Starting with aerobic activity 30 plus minutes a day, 3 days per week for initial exercise prescription. Provide home exercise prescription and guidelines that participant acknowledges understanding prior to discharge. ? ?Activity Barriers & Risk Stratification: ? Activity Barriers & Cardiac Risk Stratification -  10/15/21 1251   ? ?  ? Activity Barriers & Cardiac Risk Stratification  ? Activity Barriers Arthritis   ? Cardiac Risk Stratification High   ? ?  ?  ? ?  ? ? ?6 Minute Walk: ? 6 Minute Walk   ? ? [place] Name 10/15/21 1143  ?  ?  ?  ? 6 Minute Walk  ? Phase Initial    ? Distance 1678 feet    ? Walk Time 6 minutes    ? # of Rest Breaks 0    ? MPH 3.18    ? METS 3.44    ? RPE 11    ? Perceived Dyspnea  1    ? VO2 Peak 12.04    ? Symptoms Yes (comment)    ? Comments SOB, RPD =1    ? Resting HR 74 bpm    ? Resting BP 108/76    ? Resting Oxygen Saturation  96 %    ? Exercise Oxygen Saturation  during 6 min walk 96 %    ? Max Ex. HR 88 bpm    ? Max Ex. BP 136/64     ? 2 Minute Post BP 120/60    ? ?  ?  ? ?  ? ? ?Oxygen Initial Assessment: ? ? ?Oxygen Re-Evaluation: ? ? ?Oxygen Discharge (Final Oxygen Re-Evaluation): ? ? ?Initial Exercise Prescription: ? Initial Exercise Prescription - 10/15/21 1200   ? ?  ? Date of Initial Exercise RX and Referring Provider  ? Date 10/15/21   ? Referring Provider Adrian Prows, MD   ? Expected Discharge Date 12/13/21   ?  ? Treadmill  ? MPH 2.7   ? Grade 1   ? Minutes 15   ? METs 3.44   ?  ? Arm Ergometer  ? Level 1.5   ? RPM 50   ? Minutes 15   ? METs 2.5   ? ?  ?  ? ?  ? ? ?Perform Capillary Blood Glucose checks as needed. ? ?Exercise Prescription Changes: ? ? Exercise Prescription Changes   ? ? [place] Name 10/21/21 1703  ?  ?  ?  ?  ?  ? Response to Exercise  ? Blood Pressure (Admit) 124/90      ? Blood Pressure (Exercise) 138/84      ? Blood Pressure (Exit) 108/70      ? Heart Rate (Admit) 71 bpm      ? Heart Rate (Exercise) 117 bpm      ? Heart Rate (Exit) 80 bpm      ? Rating of Perceived Exertion (Exercise) 11.5      ? Perceived Dyspnea (Exercise) 0      ? Symptoms 0      ? Comments Pt first day in teh CRP2 program      ? Duration Progress to 30 minutes of  aerobic without signs/symptoms of physical distress      ? Intensity THRR unchanged      ?  ? Progression  ? Progression Continue to progress workloads to maintain intensity without signs/symptoms of physical distress.      ? Average METs 2.65      ?  ? Resistance Training  ? Weight 3 lbs      ? Reps 10-15      ? Time 10 Minutes      ?  ? Treadmill  ? MPH 2.7      ? Grade 1      ?  Minutes 15      ? METs 3.44      ?  ? Arm Ergometer  ? Level 1.5      ? RPM 50      ? Minutes 15      ? METs 1.9      ? ?  ?  ? ?  ? ? ?Exercise Comments: ? ? Exercise Comments   ? ? [place] Name 10/21/21 1707  ?  ?  ?  ?  ? Exercise Comments Pt first day in the CRP2 program. Pt is tolerating exercise well with an average MET level of 2.65. Pt is learning her THRR, RPE and ExRx.      ? ?  ?  ? ?  ? ? ?Exercise Goals  and Review: ? ? Exercise Goals   ? ? [place] Name 10/15/21 1252  ?  ?  ?  ?  ?  ? Exercise Goals  ? Increase Physical Activity Yes      ? Intervention Provide advice, education, support and counseling about physical activity/exercise needs.;Develop an individualized exercise prescription for aerobic and resistive training based on initial evaluation findings, risk stratification, comorbidities and participant's personal goals.      ? Expected Outcomes Short Term: Attend rehab on a regular basis to increase amount of physical activity.;Long Term: Add in home exercise to make exercise part of routine and to increase amount of physical activity.;Long Term: Exercising regularly at least 3-5 days a week.      ? Increase Strength and Stamina Yes      ? Intervention Provide advice, education, support and counseling about physical activity/exercise needs.;Develop an individualized exercise prescription for aerobic and resistive training based on initial evaluation findings, risk stratification, comorbidities and participant's personal goals.      ? Expected Outcomes Short Term: Increase workloads from initial exercise prescription for resistance, speed, and METs.;Short Term: Perform resistance training exercises routinely during rehab and add in resistance training at home;Long Term: Improve cardiorespiratory fitness, muscular endurance and strength as measured by increased METs and functional capacity (6MWT)      ? Able to understand and use rate of perceived exertion (RPE) scale Yes      ? Intervention Provide education and explanation on how to use RPE scale      ? Expected Outcomes Short Term: Able to use RPE daily in rehab to express subjective intensity level;Long Term:  Able to use RPE to guide intensity level when exercising independently      ? Knowledge and understanding of Target Heart Rate Range (THRR) Yes      ? Intervention Provide education and explanation of THRR including how the numbers were predicted and  where they are located for reference      ? Expected Outcomes Short Term: Able to state/look up THRR;Short Term: Able to use daily as guideline for intensity in rehab;Long Term: Able to use THRR to govern in

## 2021-11-12 NOTE — Telephone Encounter (Signed)
Error

## 2021-11-13 ENCOUNTER — Telehealth (HOSPITAL_COMMUNITY): Payer: Self-pay | Admitting: Family Medicine

## 2021-11-13 ENCOUNTER — Encounter (HOSPITAL_COMMUNITY): Payer: Medicare HMO

## 2021-11-13 ENCOUNTER — Telehealth: Payer: Self-pay | Admitting: Pulmonary Disease

## 2021-11-13 DIAGNOSIS — G4733 Obstructive sleep apnea (adult) (pediatric): Secondary | ICD-10-CM

## 2021-11-13 NOTE — Telephone Encounter (Signed)
Called patient and she states that she feels like her cpap pressure is way too much. She states her much and throat are dry when she wakes up. She states her pressure setting is at 10.4 right now. She has a follow up with Dr Jenetta Downer at the end of the month but she wants to know what he advises till then. ? ?Dr Jenetta Downer please advise  ?

## 2021-11-15 ENCOUNTER — Encounter (HOSPITAL_COMMUNITY): Payer: Medicare HMO

## 2021-11-15 NOTE — Telephone Encounter (Signed)
We can try and get a compliance from the machine ? ?Contact DME and obtain compliance report to review  -this will give [place] some information regarding what is causing the problem ?She has a home sleep study pending to ascertain severity of sleep apnea ? ?Alternatively, we can send a DME order to change pressure from auto CPAP 5-11 to auto CPAP 5-8 ?

## 2021-11-15 NOTE — Telephone Encounter (Signed)
Compliance report printed and place din Dr Os box. New order for cpap settings sent into [facility]. Called patient and let her know we were adjusting her settings and to let us know how this was helping her sleep at night. Nothing further needed  ?

## 2021-11-18 ENCOUNTER — Encounter (HOSPITAL_COMMUNITY)
Admission: RE | Admit: 2021-11-18 | Discharge: 2021-11-18 | Disposition: A | Discharge status: Home / Self Care | Payer: Medicare HMO | Source: Ambulatory Visit | Attending: Cardiology | Admitting: Cardiology

## 2021-11-18 ENCOUNTER — Other Ambulatory Visit: Payer: Self-pay

## 2021-11-18 DIAGNOSIS — I214 Non-ST elevation (NSTEMI) myocardial infarction: Secondary | ICD-10-CM | POA: Diagnosis not present

## 2021-11-20 ENCOUNTER — Other Ambulatory Visit: Payer: Self-pay

## 2021-11-20 ENCOUNTER — Encounter (HOSPITAL_COMMUNITY)
Admission: RE | Admit: 2021-11-20 | Discharge: 2021-11-20 | Disposition: A | Discharge status: Home / Self Care | Payer: Medicare HMO | Source: Ambulatory Visit | Attending: Cardiology | Admitting: Cardiology

## 2021-11-20 DIAGNOSIS — I214 Non-ST elevation (NSTEMI) myocardial infarction: Secondary | ICD-10-CM

## 2021-11-20 NOTE — Progress Notes (Signed)
CARDIAC REHAB PHASE 2 ? ?Reviewed home exercise with pt today. Pt is tolerating exercise well. Pt will continue to exercise on their own by walking for 30-45 minutes per session 2-4 days a week in addition to the 3 days in CRP2. Advised pt on THRR, RPE scale, hydration and temperature/humidity precautions. Reinforced NTG use, S/S to stop exercise and when to call MD vs 911. Encouraged warm up cool down and stretches with exercise sessions. Pt verbalized understanding, all questions were answered and pt was given a copy to take home.  ?  ?Kirby Funk ACSM-CEP ?11/20/2021 ?4:29 PM ? ?

## 2021-11-22 ENCOUNTER — Encounter (HOSPITAL_COMMUNITY)
Admission: RE | Admit: 2021-11-22 | Discharge: 2021-11-22 | Disposition: A | Discharge status: Home / Self Care | Payer: Medicare HMO | Source: Ambulatory Visit | Attending: Cardiology | Admitting: Cardiology

## 2021-11-22 ENCOUNTER — Other Ambulatory Visit: Payer: Self-pay

## 2021-11-22 DIAGNOSIS — I214 Non-ST elevation (NSTEMI) myocardial infarction: Secondary | ICD-10-CM | POA: Diagnosis not present

## 2021-11-25 ENCOUNTER — Encounter: Payer: Self-pay | Admitting: Pulmonary Disease

## 2021-11-25 ENCOUNTER — Telehealth: Payer: Self-pay | Admitting: Pulmonary Disease

## 2021-11-25 ENCOUNTER — Ambulatory Visit: Payer: Medicare HMO | Admitting: Pulmonary Disease

## 2021-11-25 ENCOUNTER — Other Ambulatory Visit: Payer: Self-pay

## 2021-11-25 ENCOUNTER — Encounter (HOSPITAL_COMMUNITY)
Admission: RE | Admit: 2021-11-25 | Discharge: 2021-11-25 | Disposition: A | Discharge status: Home / Self Care | Payer: Medicare HMO | Source: Ambulatory Visit | Attending: Cardiology | Admitting: Cardiology

## 2021-11-25 VITALS — BP 118/74 | HR 68 | Temp 98.2°F | Ht 64.0 in | Wt 184.0 lb

## 2021-11-25 DIAGNOSIS — G4733 Obstructive sleep apnea (adult) (pediatric): Secondary | ICD-10-CM | POA: Diagnosis not present

## 2021-11-25 DIAGNOSIS — I214 Non-ST elevation (NSTEMI) myocardial infarction: Secondary | ICD-10-CM | POA: Diagnosis not present

## 2021-11-25 DIAGNOSIS — Z9989 Dependence on other enabling machines and devices: Secondary | ICD-10-CM | POA: Diagnosis not present

## 2021-11-25 NOTE — Progress Notes (Signed)
? ?      ?Kelly Warner    [id number]    [birth year] ? ?Primary Care Physician:Kaplan, Baldemar Friday., PA-C ? ?Referring Physician: Aletha Halim., PA-C ?[id number] ?[place],  [place] [address] ? ?Chief complaint:   ? ?Follow-up for obstructive sleep apnea ? ? ?HPI: ? ?Pressure change 5-8 ? ?She does have some drying of airway/nasal passages, the machine does not seem to run out of water ?-Humidification may not be set optimally ? ?Pressure of 5-8 is quite low ?-Continue same pressure monitor ? ?Some research about headgear may help ? ?Compliance with CPAP is better, continues to try to use it nightly ? ?Still has issues with the mask, issues with the pressure ?Other options of treatment were discussed ?Significance of managing risk for coronary events discussed ?Risk of cardiovascular disease with untreated sleep disordered breathing discussed ? ?Inspire device discussed during the last visit ? ?She has recently managed to lose over 20 pounds and she is wondering whether the severity of sleep disordered breathing has changed ? ?She does have nasal stuffiness and congestion ?-Nasal dryness in the morning ?-Discussed about nasal rinses and nasal sprays to help ? ?History significant for ?History of nonrestorative sleep ?Has been noted to stop breathing, snorting respirations, headaches ? ?Usually goes to bed about 10-11 PM, takes about 10 minutes to fall asleep, wakes up about 7 AM ? ?Her weight has been relatively stable ?Admits to headaches ? ?History of allergies, history of allergy to mold and dust ? ?Pets: Dog ?Occupation: No pertinent occupational history ?Exposures: Significant exposures ?Smoking history: Non-smoker ? ?Outpatient Encounter Medications as of 11/25/2021  ?Medication Sig  ? aspirin EC 81 MG tablet Take 81 mg by mouth at bedtime.   ? atenolol (TENORMIN) 25 MG tablet Take 1 tablet (25 mg total) by mouth daily.  ? atorvastatin (LIPITOR) 20 MG tablet Take 20 mg by mouth every evening.  ?  clopidogrel (PLAVIX) 75 MG tablet Take 75 mg by mouth at bedtime.  ? Coenzyme Q10 (COQ10) 100 MG CAPS Take 100 mg by mouth every evening.  ? dexlansoprazole (DEXILANT) 60 MG capsule Take 60 mg by mouth at bedtime.  ? famotidine (PEPCID) 20 MG tablet Take 20 mg by mouth daily before lunch.  ? levothyroxine (SYNTHROID) 25 MCG tablet Take 25 mcg by mouth daily before breakfast.  ? loratadine (CLARITIN) 10 MG tablet Take 10 mg by mouth daily.  ? mometasone (NASONEX) 50 MCG/ACT nasal spray Place 2 sprays into the nose daily as needed (congestion).  ? nitroGLYCERIN (NITROSTAT) 0.4 MG SL tablet Place 1 tablet (0.4 mg total) under the tongue every 5 (five) minutes as needed for chest pain.  ? Polyethyl Glycol-Propyl Glycol (SYSTANE) 0.4-0.3 % SOLN Place 1-2 drops into both eyes 3 (three) times daily as needed (dry/irritated eyes.).  ? [DISCONTINUED] citalopram (CELEXA) 10 MG/5ML suspension Take 10 mg by mouth at bedtime. (Patient not taking: Reported on 11/25/2021)  ? ?No facility-administered encounter medications on file as of 11/25/2021.  ? ? ?Allergies as of 11/25/2021 - Review Complete 11/25/2021  ?Allergen Reaction Noted  ? Isosorbide nitrate Other (See Comments) 08/21/2021  ? Lactose intolerance (gi) Other (See Comments) 04/09/2020  ? ? ?Past Medical History:  ?Diagnosis Date  ? Coronary artery disease   ? GERD (gastroesophageal reflux disease)   ? Hypertension   ? Migraines   ? NSTEMI (non-ST elevation myocardial infarction) ([place])   ? Stroke [facility])   ? ? ?Past Surgical History:  ?Procedure Laterality  Date  ? ABDOMINAL HYSTERECTOMY    ? CARDIAC CATHETERIZATION    ? COLONOSCOPY WITH PROPOFOL N/A 04/19/2020  ? Procedure: COLONOSCOPY WITH PROPOFOL;  Surgeon: Juanita Craver, MD;  Location: [facility];  Service: Endoscopy;  Laterality: N/A;  ? LEFT HEART CATH AND CORONARY ANGIOGRAPHY N/A 08/14/2021  ? Procedure: LEFT HEART CATH AND CORONARY ANGIOGRAPHY;  Surgeon: Nigel Mormon, MD;  Location: [place] [facility];   Service: Cardiovascular;  Laterality: N/A;  ? NASAL FRACTURE SURGERY    ? POLYPECTOMY  04/19/2020  ? Procedure: POLYPECTOMY;  Surgeon: Juanita Craver, MD;  Location: [facility];  Service: Endoscopy;;  ? SHOULDER ARTHROSCOPY Right   ? TONSILLECTOMY    ? ? ?Family History  ?Problem Relation Age of Onset  ? Atrial fibrillation Mother 98  ? Hypertension Mother   ? Heart attack Father 3  ? Atrial fibrillation Sister 17  ? Atrial fibrillation Brother 50  ? Hypertension Brother   ? ? ?Social History  ? ?Socioeconomic History  ? Marital status: Married  ?  Spouse name: Shanon Brow  ? Number of children: 3  ? Years of education: 4  ? Highest education level: Not on file  ?Occupational History  ?  Comment: RN medical records  ? Occupation: Retired  ?Tobacco Use  ? Smoking status: Never  ? Smokeless tobacco: Never  ?Vaping Use  ? Vaping Use: Never used  ?Substance and Sexual Activity  ? Alcohol use: Yes  ?  Comment: occasionally  ? Drug use: No  ? Sexual activity: Yes  ?Other Topics Concern  ? Not on file  ?Social History Narrative  ? Lives with husband  ? Caffeine - none  ? ?Social Determinants of Health  ? ?Financial Resource Strain: Not on file  ?Food Insecurity: Not on file  ?Transportation Needs: Not on file  ?Physical Activity: Not on file  ?Stress: Not on file  ?Social Connections: Not on file  ?Intimate Partner Violence: Not on file  ? ? ?Review of systems: ? ?Does not feel acutely ill ?No significant musculoskeletal pains or discomfort ?Denies any chest pains or chest discomfort ?No significant shortness of breath with activity ?Nasal dryness, feels pressure may still be high ? ?Physical Exam: ? ?There were no vitals filed for this visit. ? ?Gen:   Middle-age lady does not appear to be in distress ?HEENT: No significant abnormality, moist oral mucosa ?Neck:     No JVDs, no adenopathy ?Lungs:    Clear breath sounds bilaterally ?CV:         S1-S2 appreciated, no murmur ? ? ?  10/28/2018  ?  9:00 AM 04/09/2018  ?  3:00 PM   ?Results of the Epworth flowsheet  ?Sitting and reading 0 1  ?Watching TV 0 1  ?Sitting, inactive in a public place (e.g. a theatre or a meeting) 0 0  ?As a passenger in a car for an hour without a break 0 0  ?Lying down to rest in the afternoon when circumstances permit 3 2  ?Sitting and talking to someone 0 0  ?Sitting quietly after a lunch without alcohol 0 0  ?In a car, while stopped for a few minutes in traffic 0 0  ?Total score 3 4  ? ? ?Data Reviewed: ?.  Compliance data reviewed ?77% compliance ?Set between 5-8 ?Average use of 6 hours 37 minutes ?Residual AHI of 0.8 ? ?Assessment:  ?. ?.  Moderate obstructive sleep apnea ?-Continue CPAP on a nightly basis ?-Mask issues addressed ?-Humidification issues  discussed ? ?.  Nonrestorative sleep ?-Appears to be improving with CPAP use ? ?.  Excessive daytime sleepiness ?-This appears to be improving ? ?.  Nasal stuffiness and congestion ?Marland Kitchen  Dryness when she wakes up in the morning with CPAP  ?-Adjust humidification as able ? ?Plan/Recommendations: ?.  Continue CPAP nightly ?Marland Kitchen  Continue with compliance monitoring ? ?.  Encouraged about adequate number of hours of sleep ? ?.  Adjust the medication as able ? ?Marland Kitchen  More research about hectare to find what fits better ? ?.  Graded exercise as tolerated ? ?.  Tentative follow-up in 6 months ? ? ?Sherrilyn Rist MD ?[facility] ?11/25/2021, 1:46 PM ? ?CC: Aletha Halim., PA-C ? ? ?

## 2021-11-25 NOTE — Patient Instructions (Signed)
Continue CPAP use nightly ? ?Adjust humidification if able to, medical supply company may be able to walk you through how to  adjust it as well ? ?Research about head Kelly Warner, if you find one that he may want to try in place of what you use currently ? ?I will see you in about 6 months ? ?Call with significant concerns ? ?Pay attention to the machines settings-currently is set at 5-8 ?

## 2021-11-25 NOTE — Telephone Encounter (Signed)
DME referral ? ?Please let patient know that we will be asking DME to decrease pressure ?Reviewed download from CPAP ? ?Change pressure from 5-11 to 5-8 ? ?Download in 6 to 8 weeks ? ?Follow-up as previously scheduled ?

## 2021-11-27 ENCOUNTER — Telehealth (HOSPITAL_COMMUNITY): Payer: Self-pay | Admitting: Family Medicine

## 2021-11-27 ENCOUNTER — Encounter (HOSPITAL_COMMUNITY): Payer: Medicare HMO

## 2021-11-28 NOTE — Telephone Encounter (Signed)
The referral has been made to change pressures and we can get a download at a later date. Nothing further needed. ?

## 2021-11-29 ENCOUNTER — Encounter (HOSPITAL_COMMUNITY)
Admission: RE | Admit: 2021-11-29 | Discharge: 2021-11-29 | Disposition: A | Discharge status: Home / Self Care | Payer: Medicare HMO | Source: Ambulatory Visit | Attending: Cardiology | Admitting: Cardiology

## 2021-11-29 DIAGNOSIS — I214 Non-ST elevation (NSTEMI) myocardial infarction: Secondary | ICD-10-CM | POA: Diagnosis not present

## 2021-12-02 ENCOUNTER — Encounter (HOSPITAL_COMMUNITY)
Admission: RE | Admit: 2021-12-02 | Discharge: 2021-12-02 | Disposition: A | Discharge status: Home / Self Care | Payer: Medicare HMO | Source: Ambulatory Visit | Attending: Cardiology | Admitting: Cardiology

## 2021-12-02 DIAGNOSIS — I214 Non-ST elevation (NSTEMI) myocardial infarction: Secondary | ICD-10-CM

## 2021-12-02 DIAGNOSIS — Z5189 Encounter for other specified aftercare: Secondary | ICD-10-CM | POA: Diagnosis present

## 2021-12-02 DIAGNOSIS — I252 Old myocardial infarction: Secondary | ICD-10-CM | POA: Insufficient documentation

## 2021-12-04 ENCOUNTER — Encounter (HOSPITAL_COMMUNITY)
Admission: RE | Admit: 2021-12-04 | Discharge: 2021-12-04 | Disposition: A | Discharge status: Home / Self Care | Payer: Medicare HMO | Source: Ambulatory Visit | Attending: Cardiology | Admitting: Cardiology

## 2021-12-04 DIAGNOSIS — Z5189 Encounter for other specified aftercare: Secondary | ICD-10-CM | POA: Diagnosis not present

## 2021-12-04 DIAGNOSIS — I214 Non-ST elevation (NSTEMI) myocardial infarction: Secondary | ICD-10-CM

## 2021-12-06 ENCOUNTER — Encounter (HOSPITAL_COMMUNITY)
Admission: RE | Admit: 2021-12-06 | Discharge: 2021-12-06 | Disposition: A | Discharge status: Home / Self Care | Payer: Medicare HMO | Source: Ambulatory Visit | Attending: Cardiology | Admitting: Cardiology

## 2021-12-06 DIAGNOSIS — I214 Non-ST elevation (NSTEMI) myocardial infarction: Secondary | ICD-10-CM

## 2021-12-06 DIAGNOSIS — Z5189 Encounter for other specified aftercare: Secondary | ICD-10-CM | POA: Diagnosis not present

## 2021-12-09 ENCOUNTER — Telehealth (HOSPITAL_COMMUNITY): Payer: Self-pay | Admitting: Family Medicine

## 2021-12-09 ENCOUNTER — Encounter (HOSPITAL_COMMUNITY): Payer: Medicare HMO

## 2021-12-11 ENCOUNTER — Encounter (HOSPITAL_COMMUNITY)
Admission: RE | Admit: 2021-12-11 | Discharge: 2021-12-11 | Disposition: A | Discharge status: Home / Self Care | Payer: Medicare HMO | Source: Ambulatory Visit | Attending: Cardiology | Admitting: Cardiology

## 2021-12-11 VITALS — Ht 64.25 in | Wt 186.5 lb

## 2021-12-11 DIAGNOSIS — I214 Non-ST elevation (NSTEMI) myocardial infarction: Secondary | ICD-10-CM

## 2021-12-11 DIAGNOSIS — Z5189 Encounter for other specified aftercare: Secondary | ICD-10-CM | POA: Diagnosis not present

## 2021-12-11 NOTE — Progress Notes (Signed)
Cardiac Individual Treatment Plan ? ?Patient Details  ?Name: Kelly Warner ?MRN: [id number] ?Date of Birth: April 28, 1956 ?Referring Provider:   ?Flowsheet Row CARDIAC REHAB PHASE II ORIENTATION from 10/15/2021 in [facility]  ?Referring Provider Adrian Prows, MD  ? ?  ? ? ?Initial Encounter Date:  ?Flowsheet Row CARDIAC REHAB PHASE II ORIENTATION from 10/15/2021 in [facility]  ?Date 10/15/21  ? ?  ? ? ?Visit Diagnosis: NSTEMI 04/18/22 in [facility], 08/13/21 Medical Treatment ? ?Patient's Home Medications on Admission: ? ?Current Outpatient Medications:  ?  aspirin EC 81 MG tablet, Take 81 mg by mouth at bedtime. , Disp: , Rfl:  ?  atenolol (TENORMIN) 25 MG tablet, Take 1 tablet (25 mg total) by mouth daily., Disp: 180 tablet, Rfl: 3 ?  atorvastatin (LIPITOR) 20 MG tablet, Take 20 mg by mouth every evening., Disp: , Rfl:  ?  clopidogrel (PLAVIX) 75 MG tablet, Take 75 mg by mouth at bedtime., Disp: , Rfl:  ?  Coenzyme Q10 (COQ10) 100 MG CAPS, Take 100 mg by mouth every evening., Disp: , Rfl:  ?  dexlansoprazole (DEXILANT) 60 MG capsule, Take 60 mg by mouth at bedtime., Disp: , Rfl:  ?  famotidine (PEPCID) 20 MG tablet, Take 20 mg by mouth daily before lunch., Disp: , Rfl:  ?  levothyroxine (SYNTHROID) 25 MCG tablet, Take 25 mcg by mouth daily before breakfast., Disp: , Rfl:  ?  loratadine (CLARITIN) 10 MG tablet, Take 10 mg by mouth daily., Disp: , Rfl:  ?  mometasone (NASONEX) 50 MCG/ACT nasal spray, Place 2 sprays into the nose daily as needed (congestion)., Disp: , Rfl:  ?  nitroGLYCERIN (NITROSTAT) 0.4 MG SL tablet, Place 1 tablet (0.4 mg total) under the tongue every 5 (five) minutes as needed for chest pain., Disp: 30 tablet, Rfl: 3 ?  Polyethyl Glycol-Propyl Glycol (SYSTANE) 0.4-0.3 % SOLN, Place 1-2 drops into both eyes 3 (three) times daily as needed (dry/irritated eyes.)., Disp: , Rfl:  ? ?Past Medical History: ?Past Medical History:  ?Diagnosis  Date  ? Coronary artery disease   ? GERD (gastroesophageal reflux disease)   ? Hypertension   ? Migraines   ? NSTEMI (non-ST elevation myocardial infarction) ([place])   ? Stroke [facility])   ? ? ?Tobacco Use: ?Social History  ? ?Tobacco Use  ?Smoking Status Never  ?Smokeless Tobacco Never  ? ? ?Labs: ?Review Flowsheet   ? ?  ?  Latest Ref Rng & Units 04/09/2009 03/15/2017 03/16/2017  ?Labs for ITP Cardiac and Pulmonary Rehab  ?Cholestrol 0 - 200 mg/dL   177    ?LDL (calc) 0 - 99 mg/dL   105    ?HDL-C >40 mg/dL   55    ?Trlycerides <150 mg/dL   84    ?Hemoglobin A1c 4.8 - 5.6 %   5.8    ?TCO2 0 - 100 mmol/L 26   25     ?  ? ? Multiple values from one day are sorted in reverse-chronological order  ?  ?  ? ? ?Capillary Blood Glucose: ?Lab Results  ?Component Value Date  ? GLUCAP 114 (H) 03/15/2017  ? ? ? ?Exercise Target Goals: ?Exercise Program Goal: ?Individual exercise prescription set using results from initial 6 min walk test and THRR while considering  patient?s activity barriers and safety.  ? ?Exercise Prescription Goal: ?Starting with aerobic activity 30 plus minutes a day, 3 days per week for initial exercise prescription. Provide  home exercise prescription and guidelines that participant acknowledges understanding prior to discharge. ? ?Activity Barriers & Risk Stratification: ? Activity Barriers & Cardiac Risk Stratification - 10/15/21 1251   ? ?  ? Activity Barriers & Cardiac Risk Stratification  ? Activity Barriers Arthritis   ? Cardiac Risk Stratification High   ? ?  ?  ? ?  ? ? ?6 Minute Walk: ? 6 Minute Walk   ? ? [place] Name 10/15/21 1143  ?  ?  ?  ? 6 Minute Walk  ? Phase Initial    ? Distance 1678 feet    ? Walk Time 6 minutes    ? # of Rest Breaks 0    ? MPH 3.18    ? METS 3.44    ? RPE 11    ? Perceived Dyspnea  1    ? VO2 Peak 12.04    ? Symptoms Yes (comment)    ? Comments SOB, RPD =1    ? Resting HR 74 bpm    ? Resting BP 108/76    ? Resting Oxygen Saturation  96 %    ? Exercise Oxygen Saturation  during 6  min walk 96 %    ? Max Ex. HR 88 bpm    ? Max Ex. BP 136/64    ? 2 Minute Post BP 120/60    ? ?  ?  ? ?  ? ? ?Oxygen Initial Assessment: ? ? ?Oxygen Re-Evaluation: ? ? ?Oxygen Discharge (Final Oxygen Re-Evaluation): ? ? ?Initial Exercise Prescription: ? Initial Exercise Prescription - 10/15/21 1200   ? ?  ? Date of Initial Exercise RX and Referring Provider  ? Date 10/15/21   ? Referring Provider Adrian Prows, MD   ? Expected Discharge Date 12/13/21   ?  ? Treadmill  ? MPH 2.7   ? Grade 1   ? Minutes 15   ? METs 3.44   ?  ? Arm Ergometer  ? Level 1.5   ? RPM 50   ? Minutes 15   ? METs 2.5   ? ?  ?  ? ?  ? ? ?Perform Capillary Blood Glucose checks as needed. ? ?Exercise Prescription Changes: ? ? Exercise Prescription Changes   ? ? [name] Name 10/21/21 1703 11/20/21 1620 12/04/21 1628  ?  ?  ?  ? Response to Exercise  ? Blood Pressure (Admit) 124/90 110/62 122/78    ? Blood Pressure (Exercise) 138/84 140/80 130/76    ? Blood Pressure (Exit) 108/70 104/60 118/64    ? Heart Rate (Admit) 71 bpm 80 bpm 78 bpm    ? Heart Rate (Exercise) 117 bpm 107 bpm 105 bpm    ? Heart Rate (Exit) 80 bpm 86 bpm 68 bpm    ? Rating of Perceived Exertion (Exercise) 11._0 ? Perceived Dyspnea (Exercise) 0 0 0    ? Symptoms 0 0 0    ? Comments Pt first day in teh CRP2 program Reviewed MET's goals and home ExRx Reviewed MET's and goals    ? Duration Progress to 30 minutes of  aerobic without signs/symptoms of physical distress Progress to 30 minutes of  aerobic without signs/symptoms of physical distress Progress to 30 minutes of  aerobic without signs/symptoms of physical distress    ? Intensity THRR unchanged THRR unchanged THRR unchanged    ?  ? Progression  ? Progression Continue to progress workloads to maintain intensity without signs/symptoms of physical distress. Continue  to progress workloads to maintain intensity without signs/symptoms of physical distress. Continue to progress workloads to maintain intensity without signs/symptoms  of physical distress.    ? Average METs 2.65 3.26 3.26    ?  ? Resistance Training  ? Weight 3 lbs 3 lbs no    ? Reps 10-15 10-15 --    ? Time 10 Minutes 10 Minutes --    ?  ? Treadmill  ? MPH 2.7 3 3    ? Grade 1 1 1    ? Minutes 15 15 15    ? METs 3.44 3.71 3.71    ?  ? NuStep  ? Level -- 2 2    ? SPM -- 80 80    ? Minutes -- 15 15    ? METs -- 2.8 2.8    ?  ? Arm Ergometer  ? Level 1.5 -- --    ? RPM 50 -- --    ? Minutes 15 -- --    ? METs 1.9 -- --    ?  ? Home Exercise Plan  ? Plans to continue exercise at -- Home (comment) Home (comment)    ? Frequency -- Add 3 additional days to program exercise sessions. Add 3 additional days to program exercise sessions.    ? Initial Home Exercises Provided -- 11/20/21 11/20/21    ? ?  ?  ? ?  ? ? ?Exercise Comments: ? ? Exercise Comments   ? ? Row Name 10/21/21 1707 11/20/21 1629 12/04/21 1634  ?  ?  ? Exercise Comments Pt first day in the CRP2 program. Pt is tolerating exercise well with an average MET level of 2.65. Pt is learning her THRR, RPE and ExRx. Reviewed MET's goals and home ExRx. Pt is tolerating exercise well with an average MET level of 3.26. Pt feels like she is progressing towards her goals of gaining strength, energy and stamina. pt also wanted to be able to walk 3 miles, which she is now able to do and is continuing to work on wt loss. Pt will continue to exercise 2-4 days for 30-45 mins by walking. Pt plans to go to [employer] after graduation Reviewed MET's and goals. Pt is tolerating exercise well with an average MET level of 3.26. Pt feels good about her goals of gaining strength, energy and stamina. pt is not having any trouble walking 3 miles, still continuing to work on wt loss. Pt will continue to exercise 2-4 days for 30-45 mins by walking. Pt has appt set up at [employer] after graduation    ? ?  ?  ? ?  ? ? ?Exercise Goals and Review: ? ? Exercise Goals   ? ? Row Name 10/15/21 1252  ?  ?  ?  ?  ?  ? Exercise Goals  ? Increase Physical Activity Yes       ? Intervention Provide advice, education, support and counseling about physical activity/exercise needs.;Develop an individualized exercise prescription for aerobic and resistive training based on in

## 2021-12-12 NOTE — Progress Notes (Signed)
Kelly Warner 66 y.o. female ? ?Nutrition Note ?Kelly Warner is motivated to make lifestyle changes to aid with cardiac rehab. Patient has medical history of HTN, acute ischemic stroke, unstable angina pectoris, NSTEMI, osteopenia, hyperlipidemia, vitamin D deficiency, pure hypercholesterolemia. She reports concerns of over eating while taking prednisone for sciatica. She reports she will likely take one more round and has gained about 5#. Her cardiac rehab start weight was 183.6# and today's weight 186.12#.  ? ?Labs: B12 1,'500mg'$ , A1c 5.8 ? ?Nutrition Diagnosis ?Overweight related to excessive energy intake as evidenced by a Body mass index is 31.77 kg/m?. ?Excessive carbohydrate intake related to food preferences and lack of food related knowledge as evidenced by A1c of 5.8 ? ?Nutrition Intervention ?Pt?s individual nutrition plan reviewed with pt. ?Benefits of adopting Heart Healthy diet discussed.  ?Continue client-centered nutrition education by RD, as part of interdisciplinary care. ? ?Monitor/Evaluation: ?Patient reports motivation to make lifestyle changes for adherence to heart healthy diet recommendation and weight management. We discussed mindful eating strategies + calorie management strategies such as eating on schedule, single serve snacks, protein/carbohydrate combination snacks, and using hot beverages when not hungry. Handouts/notes given. Patient amicable to RD suggestions and verbalizes understanding. Will follow-up as needed.  ? ?10 minutes spent in review of topics related to a heart healthy diet including sodium intake, blood sugar control, weight management, and fiber intake. ? ?Goal(s) ?Identify mindful eating strategies to better manage calorie intake. Eating on schedule, protein/carbohydrate snacks, single serve snack choices, hot beverages, etc.  ?Pt to identify and limit food sources of saturated fat, trans fat, refined carbohydrates and sodium ?Pt able to name foods that affect blood  glucose. Continue to limit simple sugars, refined carbohydrates, sugary beverages, etc.  ?Pt to describe the benefit of including lean protein/plant proteins, fruits, vegetables, whole grains, nuts/seeds, and low-fat dairy products in a heart healthy meal plan. ?Pt to practice mindful and intuitive eating exercises ? ?Plan:  ?Will provide client-centered nutrition education as part of interdisciplinary care ?Monitor and evaluate progress toward nutrition goal with team. ? ? ?Kelly [place], MS, RDN, LDN ? ?

## 2021-12-13 ENCOUNTER — Encounter (HOSPITAL_COMMUNITY)
Admission: RE | Admit: 2021-12-13 | Discharge: 2021-12-13 | Disposition: A | Discharge status: Home / Self Care | Payer: Medicare HMO | Source: Ambulatory Visit | Attending: Cardiology | Admitting: Cardiology

## 2021-12-13 DIAGNOSIS — Z5189 Encounter for other specified aftercare: Secondary | ICD-10-CM | POA: Diagnosis not present

## 2021-12-13 DIAGNOSIS — I214 Non-ST elevation (NSTEMI) myocardial infarction: Secondary | ICD-10-CM

## 2021-12-13 NOTE — Progress Notes (Signed)
Discharge Progress Report ? ?Patient Details  ?Name: Kelly Warner ?MRN: [id number] ?Date of Birth: 07/09/1956 ?Referring Provider:   ?Flowsheet Row CARDIAC REHAB PHASE II ORIENTATION from 10/15/2021 in [place]  ?Referring Provider Adrian Prows, MD  ? ?  ? ? ? ?Number of Visits: 18 ? ?Reason for Discharge:  ?Patient reached a stable level of exercise. ?Patient independent in their exercise. ?Patient has met program and personal goals. ? ?Smoking History:  ?Social History  ? ?Tobacco Use  ?Smoking Status Never  ?Smokeless Tobacco Never  ? ? ?Diagnosis:  ?NSTEMI 04/18/21 in [place], 08/13/21 Medical Treatment ? ?ADL [facility]: ? ? ?Initial Exercise Prescription: ? Initial Exercise Prescription - 10/15/21 1200   ? ?  ? Date of Initial Exercise RX and Referring Provider  ? Date 10/15/21   ? Referring Provider Adrian Prows, MD   ? Expected Discharge Date 12/13/21   ?  ? Treadmill  ? MPH 2.7   ? Grade 1   ? Minutes 15   ? METs 3.44   ?  ? Arm Ergometer  ? Level 1.5   ? RPM 50   ? Minutes 15   ? METs 2.5   ? ?  ?  ? ?  ? ? ?Discharge Exercise Prescription (Final Exercise Prescription Changes): ? Exercise Prescription Changes - 12/13/21 1625   ? ?  ? Response to Exercise  ? Blood Pressure (Admit) 110/70   ? Blood Pressure (Exercise) 150/80   ? Blood Pressure (Exit) 104/64   ? Heart Rate (Admit) 81 bpm   ? Heart Rate (Exercise) 108 bpm   ? Heart Rate (Exit) 71 bpm   ? Rating of Perceived Exertion (Exercise) 10.5   ? Perceived Dyspnea (Exercise) 0   ? Symptoms 0   ? Comments Pt graduated the CRP2 program   ? Duration Progress to 30 minutes of  aerobic without signs/symptoms of physical distress   ? Intensity THRR unchanged   ?  ? Progression  ? Progression Continue to progress workloads to maintain intensity without signs/symptoms of physical distress.   ? Average METs 3.41   ?  ? Resistance Training  ? Weight 3 lbs wts   ? Reps 10-15   ? Time 10 Minutes   ?  ? Treadmill  ? MPH 3   ? Grade 1   ?  Minutes 15   ? METs 3.71   ?  ? NuStep  ? Level 3   ? SPM 80   ? Minutes 15   ? METs 3.1   ?  ? Home Exercise Plan  ? Plans to continue exercise at Home (comment)   ? Frequency Add 3 additional days to program exercise sessions.   ? Initial Home Exercises Provided 11/20/21   ? ?  ?  ? ?  ? ? ?Functional Capacity: ? 6 Minute Walk   ? ? [place] Name 10/15/21 1143 12/11/21 1620  ?  ?  ? 6 Minute Walk  ? Phase Initial Discharge   ? Distance 1678 feet 1855 feet   ? Distance % Change -- 10.55 %   ? Distance Feet Change -- 177 ft   ? Walk Time 6 minutes 6 minutes   ? # of Rest Breaks 0 0   ? MPH 3.18 3.51   ? METS 3.44 3.81   ? RPE 11 11   ? Perceived Dyspnea  1 0   ? VO2 Peak 12.04 13.34   ?  Symptoms Yes (comment) No   ? Comments SOB, RPD =1 --   ? Resting HR 74 bpm 72 bpm   ? Resting BP 108/76 122/70   ? Resting Oxygen Saturation  96 % 97 %   ? Exercise Oxygen Saturation  during 6 min walk 96 % 98 %   ? Max Ex. HR 88 bpm 102 bpm   ? Max Ex. BP 136/64 130/62   ? 2 Minute Post BP 120/60 122/62   ? ?  ?  ? ?  ? ? ?Psychological, QOL, Others - Outcomes: ?PHQ 2/9: ? ?  12/26/2021  ?  1:57 PM 10/15/2021  ?  2:09 PM 10/15/2021  ? 11:40 AM 03/11/2018  ?  4:37 PM  ?Depression screen PHQ 2/9  ?Decreased Interest 0 0 0 0  ?Down, Depressed, Hopeless 0 0 0 0  ?PHQ - 2 Score 0 0 0 0  ? ? ?Quality of Life: ? Quality of Life - 12/13/21 1624   ? ?  ? Quality of Life  ? Select Quality of Life   ?  ? Quality of Life Scores  ? Health/Function Post 27 %   ? Socioeconomic Post 28 %   ? Psych/Spiritual Post 28.29 %   ? Family Post 28.8 %   ? GLOBAL Post 27.75 %   ? ?  ?  ? ?  ? ? ?Personal Goals: ?Goals established at orientation with interventions provided to work toward goal. ? Personal Goals and Risk Factors at Admission - 10/15/21 1300   ? ?  ? Core Components/Risk Factors/Patient Goals on Admission  ?  Weight Management Yes;Obesity;Weight Loss   ? Intervention Weight Management: Develop a combined nutrition and exercise program designed to reach  desired caloric intake, while maintaining appropriate intake of nutrient and fiber, sodium and fats, and appropriate energy expenditure required for the weight goal.;Weight Management: Provide education and appropriate resources to help participant work on and attain dietary goals.;Weight Management/Obesity: Establish reasonable short term and long term weight goals.;Obesity: Provide education and appropriate resources to help participant work on and attain dietary goals.   ? Admit Weight 183 lb 10.3 oz (83.3 kg)   ? Expected Outcomes Long Term: Adherence to nutrition and physical activity/exercise program aimed toward attainment of established weight goal;Short Term: Continue to assess and modify interventions until short term weight is achieved;Weight Maintenance: Understanding of the daily nutrition guidelines, which includes 25-35% calories from fat, 7% or less cal from saturated fats, less than 233m cholesterol, less than 1.5gm of sodium, & 5 or more servings of fruits and vegetables daily;Weight Loss: Understanding of general recommendations for a balanced deficit meal plan, which promotes 1-2 lb weight loss per week and includes a negative energy balance of [phone] kcal/d;Understanding recommendations for meals to include 15-35% energy as protein, 25-35% energy from fat, 35-60% energy from carbohydrates, less than 2045mof dietary cholesterol, 20-35 gm of total fiber daily;Understanding of distribution of calorie intake throughout the day with the consumption of 4-5 meals/snacks   ? Hypertension Yes   ? Intervention Provide education on lifestyle modifcations including regular physical activity/exercise, weight management, moderate sodium restriction and increased consumption of fresh fruit, vegetables, and low fat dairy, alcohol moderation, and smoking cessation.;Monitor prescription use compliance.   ? Expected Outcomes Short Term: Continued assessment and intervention until BP is < 140/9048mG in  hypertensive participants. < 130/9m3m in hypertensive participants with diabetes, heart failure or chronic kidney disease.;Long Term: Maintenance of blood pressure at goal levels.   ?  Lipids Yes   ? Intervention Provide education and support for participant on nutrition & aerobic/resistive exercise along with prescribed medications to achieve LDL <36m, HDL >465m   ? Expected Outcomes Short Term: Participant states understanding of desired cholesterol values and is compliant with medications prescribed. Participant is following exercise prescription and nutrition guidelines.;Long Term: Cholesterol controlled with medications as prescribed, with individualized exercise RX and with personalized nutrition plan. Value goals: LDL < 7035mHDL > 40 mg.   ? ?  ?  ? ?  ?  ? ?Personal Goals Discharge: ? Goals and Risk Factor Review   ? ? RowElbertonme 10/22/21 0755 11/12/21 1747 12/11/21 1004  ?  ?  ?  ? Core Components/Risk Factors/Patient Goals Review  ? Personal Goals Review Weight Management/Obesity;Hypertension;Lipids Weight Management/Obesity;Hypertension;Lipids Weight Management/Obesity;Hypertension;Lipids    ? Review Taliah started cardiac rehab on 10/21/21. Emma-Lee did well with exercise. Vital signs were stable. ChaToyokos been doing well with exercise, vital signs and CBG's have been stable Kilie has been doing well with exercise, vital signs and CBG's have been stable. ChaAamoris had some sciatic nerve discomfort tha has improved after taking prednisone. ChaBriarrosell complete cardiac rehab on 12/13/21    ? Expected Outcomes Jamin will continue to participate in phase 2 cardiac rehab for exercise, nutrition and lifestyle modifications. ChaTimmyll continue to participate in phase 2 cardiac rehab for exercise, nutrition and lifestyle modifications. ChaHeball continue to participate in phase 2 cardiac rehab for exercise, nutrition and lifestyle modifications.    ? ?  ?  ? ?  ? ? ?Exercise Goals and  Review: ? Exercise Goals   ? ? RowMcIntoshme 10/15/21 1252  ?  ?  ?  ?  ?  ? Exercise Goals  ? Increase Physical Activity Yes      ? Intervention Provide advice, education, support and counseling about physical a

## 2022-01-20 ENCOUNTER — Encounter: Payer: Self-pay | Admitting: Cardiology

## 2022-01-20 ENCOUNTER — Ambulatory Visit: Payer: Medicare HMO | Admitting: Cardiology

## 2022-01-20 VITALS — BP 103/74 | HR 65 | Temp 98.0°F | Resp 16 | Ht 64.0 in | Wt 189.0 lb

## 2022-01-20 DIAGNOSIS — R002 Palpitations: Secondary | ICD-10-CM

## 2022-01-20 DIAGNOSIS — I10 Essential (primary) hypertension: Secondary | ICD-10-CM

## 2022-01-20 DIAGNOSIS — E7800 Pure hypercholesterolemia, unspecified: Secondary | ICD-10-CM

## 2022-01-20 NOTE — Progress Notes (Signed)
Primary Physician/Referring:  Aletha Halim., PA-C  Patient ID: Kelly Warner, female    DOB: 02/25/1956, 66 y.o.   MRN: [id number]  Chief Complaint  Patient presents with   Follow-up   Palpitations   Hypertension   HPI:    Kelly Warner  is a 66 y.o. Caucasian female patient with hypertension, hyperlipidemia, hyperglycemia, history of ischemic stroke in 2018, hypothyroidism and GERD and migraine headaches, obstructive sleep apnea on CPAP presented to [facility] on [facility]18[facility]21 with chest pain and positive troponins, Cardiac catheterization revealing very mild CAD, small vessel disease, recommended medical therapy, overall picture was consistent with MI with nonobstructive coronary arteries.  She presents here for a 67-monthoffice visit, since increasing the dose of atenolol, she has not had any further episodes of palpitations and she is feeling the best she has in quite a while.  She has had couple episodes of chest pain, for which he took nitroglycerin but also took Tums with relief.  Past Medical History:  Diagnosis Date   Coronary artery disease    GERD (gastroesophageal reflux disease)    Hypertension    Migraines    [place] (non-ST elevation [place] infarction) [place]    [facility] ([facility]    Past Surgical History:  Procedure Laterality Date   ABDOMINAL HYSTERECTOMY     CARDIAC CATHETERIZATION     COLONOSCOPY WITH PROPOFOL N/A 04/19/2020   Procedure: COLONOSCOPY WITH PROPOFOL;  Surgeon: MJuanita Craver MD;  Location: [facility];  Service: Endoscopy;  Laterality: N/A;   LEFT HEART CATH AND CORONARY ANGIOGRAPHY N/A 08/14/2021   Procedure: LEFT HEART CATH AND CORONARY ANGIOGRAPHY;  Surgeon: PNigel Mormon MD;  [facility];  Service: Cardiovascular;  Laterality: N/A;   NASAL FRACTURE SURGERY     POLYPECTOMY  04/19/2020   Procedure: POLYPECTOMY;  Surgeon: MJuanita Craver MD;  Location: [facility];  Service: Endoscopy;;   SHOULDER ARTHROSCOPY  Right    TONSILLECTOMY     Family History  Problem Relation Age of Onset   Atrial fibrillation Mother 754  Hypertension Mother    Heart attack Father 743  Atrial fibrillation Sister 569  Atrial fibrillation Brother 520  Hypertension Brother     Social History   Tobacco Use   Smoking status: Never   Smokeless tobacco: Never  Substance Use Topics   Alcohol use: Yes    Comment: occasionally   Marital Status: Married  ROS  Review of Systems  Cardiovascular:  Positive for chest pain. Negative for dyspnea on exertion, leg swelling and palpitations.  Objective  Blood pressure 103/74, pulse 65, temperature 98 F (36.7 C), temperature source Temporal, resp. rate 16, height _0  (1.626 m), weight 189 lb (85.7 kg), SpO2 98 %. Body mass index is 32.44 kg/m.     01/20/2022   10:23 AM 01/20/2022   10:15 AM 12/11/2021    4:23 PM  Vitals with BMI  Height  _1  5' 4.25"  Weight  189 lbs 186 lbs 8 oz  BMI  397.67334.19 Systolic [id number]  Diastolic 74 52   Pulse 65 69      Physical Exam Constitutional:      Appearance: She is obese.  Neck:     Vascular: No carotid bruit or JVD.  Cardiovascular:     Rate and Rhythm: Normal rate and regular rhythm.     Pulses: Intact distal pulses.     Heart sounds: Normal heart sounds. No  murmur heard.   No gallop.  Pulmonary:     Effort: Pulmonary effort is normal.     Breath sounds: Normal breath sounds.  Abdominal:     General: Bowel sounds are normal.     Palpations: Abdomen is soft.  Musculoskeletal:     Right lower leg: No edema.     Left lower leg: No edema.     Laboratory examination:   Recent Labs    08/13/21 1130 08/13/21 1957  NA 142  --   K 4.3  --   CL 106  --   CO2 29  --   GLUCOSE 103*  --   BUN 12  --   CREATININE 0.71 0.84  CALCIUM 8.9  --   GFRNONAA >60 >60   CrCl cannot be calculated (Patient's most recent lab result is older than the maximum 21 days allowed.).     Latest Ref Rng & Units 08/13/2021     7:57 PM 08/13/2021   11:30 AM 03/15/2017   10:45 AM  CMP  Glucose 70 - 99 mg/dL  103   114    BUN 8 - 23 mg/dL  12   16    Creatinine 0.44 - 1.00 mg/dL 0.84   0.71   0.70    Sodium 135 - 145 mmol/L  142   140    Potassium 3.5 - 5.1 mmol/L  4.3   3.9    Chloride 98 - 111 mmol/L  106   103    CO2 22 - 32 mmol/L  29     Calcium 8.9 - 10.3 mg/dL  8.9         Latest Ref Rng & Units 08/13/2021    7:57 PM 08/13/2021   11:30 AM 03/15/2017   10:45 AM  CBC  WBC 4.0 - 10.5 K/uL 6.7   5.3     Hemoglobin 12.0 - 15.0 g/dL 13.1   14.0   15.0    Hematocrit 36.0 - 46.0 % 40.4   43.1   44.0    Platelets 150 - 400 K/uL 261   266      External Labs:  Labs 04/26/2021:  Apo A1 + B + Ratio 0.3 (0.00-0.6). Lipoprotein (a) <75.0 nmol/L : 21.5     External labs:  04/26/2021: Apo A1 + B + Ratio 0.3 (0.00-0.6). Lipoprotein (a) <75.0 nmol/L : 21.5    Labs 04/23/2021:  TSH normal at 3.04.  A1c 5.9%.  Hb 13.5/HCT 40.9, platelets 206, normal indicis.  Serum glucose 138 mg, BUN 19, creatinine 0.78, EGFR >60 mL.  ALT, AST normal, alkaline phosphatase mildly elevated at 144.  Total cholesterol 135, triglycerides 83, HDL 58, LDL 59.  Allergies   Allergies  Allergen Reactions   Isosorbide Nitrate Other (See Comments)    Headache, nausea and vomiting.   Lactose Intolerance (Gi) Other (See Comments)    GI upset    Medication list after today's encounter    Current Outpatient Medications:    aspirin EC 81 MG tablet, Take 81 mg by mouth at bedtime. , Disp: , Rfl:    atenolol (TENORMIN) 25 MG tablet, Take 1 tablet (25 mg total) by mouth daily., Disp: 180 tablet, Rfl: 3   atorvastatin (LIPITOR) 20 MG tablet, Take 20 mg by mouth every evening., Disp: , Rfl:    citalopram (CELEXA) 10 MG tablet, Take 10 mg by mouth daily., Disp: , Rfl:    cyclobenzaprine (FLEXERIL) 5 MG tablet, Take 5 mg by mouth 3 (  three) times daily as needed., Disp: , Rfl:    dexlansoprazole (DEXILANT) 60 MG capsule, Take 60 mg by  mouth at bedtime., Disp: , Rfl:    diphenhydrAMINE (BENADRYL) 25 mg capsule, Take 25 mg by mouth every 6 (six) hours as needed., Disp: , Rfl:    levothyroxine (SYNTHROID) 25 MCG tablet, Take 25 mcg by mouth daily before breakfast., Disp: , Rfl:    nitroGLYCERIN (NITROSTAT) 0.4 MG SL tablet, Place 1 tablet (0.4 mg total) under the tongue every 5 (five) minutes as needed for chest pain., Disp: 30 tablet, Rfl: 3   Polyethyl Glycol-Propyl Glycol (SYSTANE) 0.4-0.3 % SOLN, Place 1-2 drops into both eyes 3 (three) times daily as needed (dry/irritated eyes.)., Disp: , Rfl:   Radiology:   No results found.  Cardiac Studies:   Echocardiogram 04/18/2021:   1. Normal left ventricular cavity size. Mild concentric left ventricular hypertrophy. Normal left ventricular systolic function. LV Ejection Fraction  is approximately: 65 %.  2. Left ventricular diastolic parameters are consistent with mild (Grade I) diastolic dysfunction (impaired relaxation). There are no regional wall motion abnormalities.  3. Normal right ventricular systolic function.  4. The aortic root is normal in size. The ascending aorta is normal in size.   Coronary angiogram 08/14/2021: LM: Normal LAD: No disease in LAD. Very small diagonal vessels (<1 mm) with no signifiant disease Lcx: No disease in Lcx. Small OM branches (<2 mm), no significant disease RCA: No disease in RCA. Small branches off PDA/PLA, no significant disease   Normal LVEDP Overall presentation c/w MINOCA. Added low dose Imdur, in addition to atenolol. Continue DAPT for a year   Ambulatory cardiac telemetry 7 days 08/27/2021: Predominant underlying rhythm was sinus with intermittent first-degree AV block.  Patient had PACs and PVCs both with ventricular bigeminy and trigeminy present.  No significant cardiac arrhythmias.  No evidence of atrial fibrillation, high degree AV block, pauses >3 seconds.  Patient events correlated with normal sinus rhythm and  PVCs. EKG:   EKG 10/23/2021: Sinus rhythm with borderline first-degree AV block at a rate of 62 bpm, incomplete right bundle branch block.  Otherwise normal EKG. no significant change from 08/27/2021.   Assessment     ICD-10-CM   1. Palpitations  R00.2     2. Primary hypertension  I10     3. Pure hypercholesterolemia  E78.00        No orders of the defined types were placed in this encounter.   No orders of the defined types were placed in this encounter.  Recommendations:   CLAUDETT BAYLY is a 66 y.o. Caucasian female patient with hypertension, hyperlipidemia, hyperglycemia, history of ischemic stroke in 2018, hypothyroidism and GERD and migraine headaches, obstructive sleep apnea on CPAP presented to [facility] on 04/18/2021 with chest pain and positive troponins, Cardiac catheterization revealing very mild CAD, small vessel disease, recommended medical therapy, overall picture was consistent with MI with nonobstructive coronary arteries.  She presents here for a 12-monthoffice visit, since increasing the dose of atenolol, she has not had any further episodes of palpitations and she is feeling the best she has in quite a while.  She has had couple episodes of chest pain, for which he took nitroglycerin but also took Tums with relief.  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  She has also had prior history of stroke, aggressive lipid management and hypertension management is a must.  She can  discontinue Plavix as she has completed the therapy.  Blood pressure is well controlled, lipids are at goal.  As she is feeling well presently, continue present medications and I will see her back in a year.  If she remains stable I can see her back on a as needed basis.  Weight loss again discussed, she is having difficult time in losing weight, she will look into different programs for nutrition.       Adrian Prows,  PA-C 01/20/2022, 10:51 AM Office: [phone]

## 2022-11-20 ENCOUNTER — Encounter: Payer: Self-pay | Admitting: Cardiology

## 2022-11-20 ENCOUNTER — Ambulatory Visit: Payer: Medicare HMO | Admitting: Cardiology

## 2022-11-20 ENCOUNTER — Other Ambulatory Visit: Payer: Self-pay

## 2022-11-20 VITALS — BP 123/76 | HR 71 | Ht 64.0 in | Wt 193.0 lb

## 2022-11-20 DIAGNOSIS — I219 Acute myocardial infarction, unspecified: Secondary | ICD-10-CM

## 2022-11-20 DIAGNOSIS — I10 Essential (primary) hypertension: Secondary | ICD-10-CM

## 2022-11-20 DIAGNOSIS — E7800 Pure hypercholesterolemia, unspecified: Secondary | ICD-10-CM

## 2022-11-20 DIAGNOSIS — I201 Angina pectoris with documented spasm: Secondary | ICD-10-CM

## 2022-11-20 MED ORDER — CLOPIDOGREL BISULFATE 75 MG PO TABS: 75.0000 mg | ORAL_TABLET | Freq: Every day | ORAL | 11 refills | Status: DC

## 2022-11-20 MED ORDER — AMLODIPINE BESYLATE 5 MG PO TABS: 5.0000 mg | ORAL_TABLET | Freq: Every evening | ORAL | 2 refills | Status: DC

## 2022-11-20 MED ORDER — RANOLAZINE ER 500 MG PO TB12: 500.0000 mg | ORAL_TABLET | Freq: Two times a day (BID) | ORAL | 2 refills | Status: DC

## 2022-11-20 NOTE — Progress Notes (Signed)
Primary Physician/Referring:  Aletha Halim., PA-C  Patient ID: Kelly Warner, female    DOB: 1956/01/24, 67 y.o.   MRN: [id number]  Chief Complaint  Patient presents with   Palpitations   Hospitalization Follow-up   HPI:    Kelly Warner  is a 67 y.o. Caucasian female patient with hypertension, hyperlipidemia, hyperglycemia, history of ischemic stroke in 2018, hypothyroidism and GERD and migraine headaches, obstructive sleep apnea on CPAP presented to [facility] on 04/18/2021 with chest pain and positive troponins, Cardiac catheterization revealing very mild CAD, small vessel disease, recommended medical therapy, overall picture was consistent with MI with nonobstructive coronary arteries. Patient presented with chest pain again on 11/12/2022 while in [place] and ended up having small NSTEMI with troponin peaking at 111.  She now presents for follow-up.  Patient had an episode of chest pain while in the office today for which she took a nitroglycerin as I walked into the office, fortunately got relieved within a few minutes.  Her husband is present at the bedside.  Otherwise asymptomatic.  Past Medical History:  Diagnosis Date   Coronary artery disease    GERD (gastroesophageal reflux disease)    Hypertension    Migraines    NSTEMI (non-ST elevation myocardial infarction) ([place])    Stroke [facility])    Past Surgical History:  Procedure Laterality Date   ABDOMINAL HYSTERECTOMY     CARDIAC CATHETERIZATION     COLONOSCOPY WITH PROPOFOL N/A 04/19/2020   Procedure: COLONOSCOPY WITH PROPOFOL;  Surgeon: Juanita Craver, MD;  Location: [facility];  Service: Endoscopy;  Laterality: N/A;   LEFT HEART CATH AND CORONARY ANGIOGRAPHY N/A 08/14/2021   Procedure: LEFT HEART CATH AND CORONARY ANGIOGRAPHY;  Surgeon: Nigel Mormon, MD;  Location: [place] CV [facility];  Service: Cardiovascular;  Laterality: N/A;   NASAL FRACTURE SURGERY     POLYPECTOMY  04/19/2020   Procedure:  POLYPECTOMY;  Surgeon: Juanita Craver, MD;  Location: [facility];  Service: Endoscopy;;   SHOULDER ARTHROSCOPY Right    TONSILLECTOMY     Family History  Problem Relation Age of Onset   Atrial fibrillation Mother 74   Hypertension Mother    Heart attack Father 67   Atrial fibrillation Sister 20   Atrial fibrillation Brother 37   Hypertension Brother     Social History   Tobacco Use   Smoking status: Never   Smokeless tobacco: Never  Substance Use Topics   Alcohol use: Yes    Comment: occasionally   Marital Status: Married  ROS  Review of Systems  Cardiovascular:  Positive for chest pain. Negative for dyspnea on exertion, leg swelling and palpitations.   Objective  Blood pressure 123/76, pulse 71, height 5\' 4"  (1.626 m), weight 193 lb (87.5 kg), SpO2 98 %. Body mass index is 33.13 kg/m.     11/20/2022    3:17 PM 01/20/2022   10:23 AM 01/20/2022   10:15 AM  Vitals with BMI  Height 5\' 4"   5\' 4"   Weight 193 lbs  189 lbs  BMI [id number]  [id number]  Systolic [id number]  Diastolic 76 74 52  Pulse 71 65 69     Physical Exam Constitutional:      Appearance: She is obese.  Neck:     Vascular: No carotid bruit or JVD.  Cardiovascular:     Rate and Rhythm: Normal rate and regular rhythm.     Pulses: Intact distal pulses.     Heart sounds: Normal heart sounds.  No murmur heard.    No gallop.  Pulmonary:     Effort: Pulmonary effort is normal.     Breath sounds: Normal breath sounds.  Abdominal:     General: Bowel sounds are normal.     Palpations: Abdomen is soft.  Musculoskeletal:     Right lower leg: No edema.     Left lower leg: No edema.      Laboratory examination:  External Labs:  Labs 11/12/2022:  Total cholesterol 114, triglycerides 77, HDL 40, LDL 49.  Serum troponin 14, 96, 111, 77.  Sodium 141, potassium 3.9, BUN 9, creatinine 0.72, EGFR 92 mL.  Hb 12.2/HCT 37.7, platelets 242.  Labs 04/26/2021:  Apo A1 + B + Ratio 0.3 (0.00-0.6). Lipoprotein (a) <75.0  nmol/L : 21.5    Allergies   Allergies  Allergen Reactions   Isosorbide Nitrate Other (See Comments)    Headache, nausea and vomiting.   Oxybutynin Other (See Comments)    Headaches   Headaches   Lisinopril Other (See Comments)    Incontinence   Incontinence   Lactose Intolerance (Gi) Other (See Comments)    GI upset    Medication list after today's encounter    Current Outpatient Medications:    amLODipine (NORVASC) 5 MG tablet, Take 1 tablet (5 mg total) by mouth every evening., Disp: 30 tablet, Rfl: 2   atorvastatin (LIPITOR) 20 MG tablet, Take 20 mg by mouth every evening., Disp: , Rfl:    citalopram (CELEXA) 10 MG tablet, Take 10 mg by mouth daily., Disp: , Rfl:    clopidogrel (PLAVIX) 75 MG tablet, Take 75 mg by mouth daily., Disp: , Rfl:    dexlansoprazole (DEXILANT) 60 MG capsule, Take 60 mg by mouth at bedtime., Disp: , Rfl:    levothyroxine (SYNTHROID) 25 MCG tablet, Take 25 mcg by mouth daily before breakfast., Disp: , Rfl:    loratadine (CLARITIN) 10 MG tablet, Take 10 mg by mouth daily., Disp: , Rfl:    mometasone (NASONEX) 50 MCG/ACT nasal spray, Place 2 sprays into the nose daily., Disp: , Rfl:    nitroGLYCERIN (NITROSTAT) 0.4 MG SL tablet, Place 1 tablet (0.4 mg total) under the tongue every 5 (five) minutes as needed for chest pain., Disp: 30 tablet, Rfl: 3   Polyethyl Glycol-Propyl Glycol (SYSTANE) 0.4-0.3 % SOLN, Place 1-2 drops into both eyes 3 (three) times daily as needed (dry/irritated eyes.)., Disp: , Rfl:    ranolazine (RANEXA) 500 MG 12 hr tablet, Take 1 tablet (500 mg total) by mouth 2 (two) times daily., Disp: 60 tablet, Rfl: 2   Vitamin D, Ergocalciferol, (DRISDOL) 1.25 MG (50000 UNIT) CAPS capsule, Take 50,000 Units by mouth every 7 (seven) days., Disp: , Rfl:   Radiology:   CT angiogram chest 11/11/2022: No evidence of PE.  No coronary calcification.  Thoracic aorta demonstrates normal caliber.  Chest x-ray single view 11/11/2022: No radiographic  evidence of acute pulmonary disease.  Lungs are clear.  Cardiac size is normal.  Cardiac Studies:   Coronary angiogram 08/14/2021: LM: Normal LAD: No disease in LAD. Very small diagonal vessels (<1 mm) with no signifiant disease Lcx: No disease in Lcx. Small OM branches (<2 mm), no significant disease RCA: No disease in RCA. Small branches off PDA/PLA, no significant disease   Normal LVEDP Overall presentation c/w MINOCA. Added low dose Imdur, in addition to atenolol. Continue DAPT for a year  Ambulatory cardiac telemetry 7 days 08/27/2021: Predominant underlying rhythm was sinus with intermittent first-degree AV  block.  Patient had PACs and PVCs both with ventricular bigeminy and trigeminy present.  No significant cardiac arrhythmias.  No evidence of atrial fibrillation, high degree AV block, pauses >3 seconds.  Patient events correlated with normal sinus rhythm and PVCs.  Echocardiogram 11/12/2022: Normal LV size and thickness, normal LV systolic function, EF 123456. Epicardial fat. No other significant abnormality. EKG:   EKG 11/20/2022: Normal sinus rhythm at the rate of 64 bpm, incomplete right bundle branch block.  No evidence of ischemia, normal QT interval. No significant change from EKG 10/23/2021:   Assessment     ICD-10-CM   1. Myocardial infarction with nonobstructive coronary arteries (HCC)  I21.9 EKG 12-Lead    ranolazine (RANEXA) 500 MG 12 hr tablet    2. Pure hypercholesterolemia  E78.00     3. Primary hypertension  I10     4. Coronary vasospasm (HCC)  I20.1 amLODipine (NORVASC) 5 MG tablet       Meds ordered this encounter  Medications   amLODipine (NORVASC) 5 MG tablet    Sig: Take 1 tablet (5 mg total) by mouth every evening.    Dispense:  30 tablet    Refill:  2   ranolazine (RANEXA) 500 MG 12 hr tablet    Sig: Take 1 tablet (500 mg total) by mouth 2 (two) times daily.    Dispense:  60 tablet    Refill:  2    Orders Placed This Encounter  Procedures    EKG 12-Lead   Recommendations:   Kelly Warner is a 67 y.o. Caucasian female patient with hypertension, hyperlipidemia, hyperglycemia, history of ischemic stroke in 2018, hypothyroidism and GERD and migraine headaches, obstructive sleep apnea on CPAP presented to [facility] on 04/18/2021 with chest pain and positive troponins, Cardiac catheterization revealing very mild CAD, small vessel disease, recommended medical therapy, overall picture was consistent with MI with nonobstructive coronary arteries. Patient presented with chest pain again on 11/12/2022 while in [place] and ended up having small NSTEMI with troponin peaking at 111.  She now presents for follow-up.  1. Myocardial infarction with nonobstructive coronary arteries [facility]) Patient presentation with recurrent episodes of NSTEMI is probably most consistent with either microvascular disease and/or coronary spasm.  Her presentation of angina at rest.,  Had an episode while in the office for which she took nitroglycerin with relief suggest coronary spasm as it is occurring during the daytime as well and not necessarily nocturnal but Prinzmetal's angina with severe spasm versus endothelial dysfunction and microvascular disease cannot be excluded.  I have started her on Ranexa mammogram p.o. twice daily and amlodipine 5 mg daily.  She could not tolerate isosorbide mononitrate.  Due to severe headache even at 15 mg dose.  Weight loss and primary prevention discussed.  2. Pure hypercholesterolemia Lipids under excellent control.  LDL is 49.  3. Primary hypertension Blood pressure is well-controlled however I discontinued atenolol in view of suspicion for coronary spasm, have started her on amlodipine instead.  She was on low-dose beta-blocker.  4. Coronary vasospasm ([place]) As discussed above, will continue to follow her closely, office visit.  In 6 weeks.      Adrian Prows, PA-C 11/20/2022, 4:10 PM Office: [phone]

## 2022-11-21 NOTE — Telephone Encounter (Signed)
From patient.

## 2022-12-29 ENCOUNTER — Encounter: Payer: Self-pay | Admitting: Cardiology

## 2022-12-29 ENCOUNTER — Ambulatory Visit: Payer: Medicare HMO | Admitting: Cardiology

## 2022-12-29 VITALS — BP 132/82 | HR 80 | Ht 64.0 in | Wt 194.0 lb

## 2022-12-29 DIAGNOSIS — I201 Angina pectoris with documented spasm: Secondary | ICD-10-CM

## 2022-12-29 DIAGNOSIS — I219 Acute myocardial infarction, unspecified: Secondary | ICD-10-CM

## 2022-12-29 DIAGNOSIS — I10 Essential (primary) hypertension: Secondary | ICD-10-CM

## 2022-12-29 MED ORDER — ASPIRIN 81 MG PO CHEW
81.0000 mg | CHEWABLE_TABLET | Freq: Every day | ORAL | Status: DC
Start: 2022-12-29 — End: 2024-03-11

## 2022-12-29 MED ORDER — AMLODIPINE BESYLATE 5 MG PO TABS
5.0000 mg | ORAL_TABLET | Freq: Every evening | ORAL | 3 refills | Status: DC
Start: 2022-12-29 — End: 2024-03-11

## 2022-12-29 MED ORDER — RANOLAZINE ER 500 MG PO TB12: 500.0000 mg | ORAL_TABLET | Freq: Two times a day (BID) | ORAL | 3 refills | Status: DC

## 2022-12-29 NOTE — Progress Notes (Signed)
Primary Physician/Referring:  Richmond Campbell., PA-C  Patient ID: Kelly Warner, female    DOB: [birth year], 67 y.o.   MRN: [id number]  Chief Complaint  Patient presents with   Myocardial infarction with nonobstructive coronary arteries   Follow-up   HPI:    Kelly Warner  is a 67 y.o. Caucasian female patient with hypertension, hyperlipidemia, hyperglycemia, history of ischemic stroke in 2018, hypothyroidism and GERD and migraine headaches, obstructive sleep apnea on CPAP presented to [facility] on 04/18/2021 with chest pain and positive troponins, Cardiac catheterization revealing very mild CAD, small vessel disease, recommended medical therapy, Patient presented with chest pain again on 11/12/2022 while in [place] and ended up having small NSTEMI with troponin peaking at 111.    Upon my review 6 weeks ago, she had an episode of chest pain while in the office which was relieved with sublingual nitroglycerin, hence suspicion for coronary spasm as etiology for the above presentation.  She now presents for routine follow-up.  Patient states that since addition of amlodipine and ranolazine, she has not had any further chest pain, states that she feels well.  She started water aerobics.   Past Medical History:  Diagnosis Date   Coronary artery disease    GERD (gastroesophageal reflux disease)    Hypertension    Migraines    NSTEMI (non-ST elevation myocardial infarction) (HCC)    Stroke [facility])    Past Surgical History:  Procedure Laterality Date   ABDOMINAL HYSTERECTOMY     CARDIAC CATHETERIZATION     COLONOSCOPY WITH PROPOFOL N/A 04/19/2020   Procedure: COLONOSCOPY WITH PROPOFOL;  Surgeon: Charna Elizabeth, MD;  [facility];  Service: Endoscopy;  Laterality: N/A;   LEFT HEART CATH AND CORONARY ANGIOGRAPHY N/A 08/14/2021   Procedure: LEFT HEART CATH AND CORONARY ANGIOGRAPHY;  Surgeon: Elder Negus, MD;  [facility]: [facility];  Service:  Cardiovascular;  Laterality: N/A;   NASAL FRACTURE SURGERY     POLYPECTOMY  04/19/2020   Procedure: POLYPECTOMY;  Surgeon: Charna Elizabeth, MD;  Location: [facility];  Service: Endoscopy;;   SHOULDER ARTHROSCOPY Right    TONSILLECTOMY     Family History  Problem Relation Age of Onset   Atrial fibrillation Mother 17   Hypertension Mother    Heart attack Father 48   Atrial fibrillation Sister 28   Atrial fibrillation Brother 50   Hypertension Brother     Social History   Tobacco Use   Smoking status: Never   Smokeless tobacco: Never  Substance Use Topics   Alcohol use: Yes    Comment: occasionally   Marital Status: Married  ROS  Review of Systems  Cardiovascular:  Negative for chest pain, dyspnea on exertion, leg swelling and palpitations.   Objective  Blood pressure 132/82, pulse 80, height 5\' 4"  (1.626 m), weight 194 lb (88 kg), SpO2 96 %. Body mass index is 33.3 kg/m.     12/29/2022    1:16 PM 11/20/2022    3:17 PM 01/20/2022   10:23 AM  Vitals with BMI  Height 5\' 4"  5\' 4"    Weight 194 lbs 193 lbs   BMI 33.28 33.11   Systolic 132 123 409  Diastolic 82 76 74  Pulse 80 71 65     Physical Exam Constitutional:      Appearance: She is obese.  Neck:     Vascular: No carotid bruit or JVD.  Cardiovascular:     Rate and Rhythm: Normal rate and regular rhythm.  Pulses: Intact distal pulses.     Heart sounds: Normal heart sounds. No murmur heard.    No gallop.  Pulmonary:     Effort: Pulmonary effort is normal.     Breath sounds: Normal breath sounds.  Abdominal:     General: Bowel sounds are normal.     Palpations: Abdomen is soft.  Musculoskeletal:     Right lower leg: No edema.     Left lower leg: No edema.     Laboratory examination:  External Labs:  Labs 11/12/2022:  Total cholesterol 114, triglycerides 77, HDL 40, LDL 49.  Serum troponin 14, 96, 111, 77.  Sodium 141, potassium 3.9, BUN 9, creatinine 0.72, EGFR 92 mL.  Hb 12.2/HCT 37.7, platelets  242.  Labs 04/26/2021:  Apo A1 + B + Ratio 0.3 (0.00-0.6). Lipoprotein (a) <75.0 nmol/L : 21.5    Allergies   Allergies  Allergen Reactions   Isosorbide Nitrate Other (See Comments)    Headache, nausea and vomiting.   Oxybutynin Other (See Comments)    Headaches   Headaches   Lisinopril Other (See Comments)    Incontinence   Incontinence   Lactose Intolerance (Gi) Other (See Comments)    GI upset    Medication list after today's encounter    Current Outpatient Medications:    aspirin (ASPIRIN CHILDRENS) 81 MG chewable tablet, Chew 1 tablet (81 mg total) by mouth daily., Disp: , Rfl:    atorvastatin (LIPITOR) 20 MG tablet, Take 20 mg by mouth every evening., Disp: , Rfl:    azelastine (ASTELIN) 0.1 % nasal spray, Place 2 sprays into both nostrils 2 (two) times daily. Use in each nostril as directed, Disp: , Rfl:    citalopram (CELEXA) 10 MG tablet, Take 10 mg by mouth daily., Disp: , Rfl:    dexlansoprazole (DEXILANT) 60 MG capsule, Take 60 mg by mouth at bedtime., Disp: , Rfl:    fexofenadine (ALLEGRA) 180 MG tablet, Take 180 mg by mouth daily., Disp: , Rfl:    levothyroxine (SYNTHROID) 25 MCG tablet, Take 25 mcg by mouth daily before breakfast., Disp: , Rfl:    mometasone (NASONEX) 50 MCG/ACT nasal spray, Place 2 sprays into the nose daily., Disp: , Rfl:    montelukast (SINGULAIR) 10 MG tablet, Take 10 mg by mouth at bedtime., Disp: , Rfl:    nitroGLYCERIN (NITROSTAT) 0.4 MG SL tablet, Place 1 tablet (0.4 mg total) under the tongue every 5 (five) minutes as needed for chest pain., Disp: 30 tablet, Rfl: 3   Polyethyl Glycol-Propyl Glycol (SYSTANE) 0.4-0.3 % SOLN, Place 1-2 drops into both eyes 3 (three) times daily as needed (dry/irritated eyes.)., Disp: , Rfl:    amLODipine (NORVASC) 5 MG tablet, Take 1 tablet (5 mg total) by mouth every evening. Take one tab extra for chest pain, Disp: 90 tablet, Rfl: 3   ranolazine (RANEXA) 500 MG 12 hr tablet, Take 1 tablet (500 mg total)  by mouth 2 (two) times daily., Disp: 180 tablet, Rfl: 3  Radiology:   CT angiogram chest 11/11/2022: No evidence of PE.  No coronary calcification.  Thoracic aorta demonstrates normal caliber.  Chest x-ray single view 11/11/2022: No radiographic evidence of acute pulmonary disease.  Lungs are clear.  Cardiac size is normal.  Cardiac Studies:   Coronary angiogram 08/14/2021: LM: Normal LAD: No disease in LAD. Very small diagonal vessels (<1 mm) with no signifiant disease Lcx: No disease in Lcx. Small OM branches (<2 mm), no significant disease RCA: No disease in  RCA. Small branches off PDA/PLA, no significant disease   Normal LVEDP Overall presentation c/w MINOCA. Added low dose Imdur, in addition to atenolol. Continue DAPT for a year  Ambulatory cardiac telemetry 7 days 08/27/2021: Predominant underlying rhythm was sinus with intermittent first-degree AV block.  Patient had PACs and PVCs both with ventricular bigeminy and trigeminy present.  No significant cardiac arrhythmias.  No evidence of atrial fibrillation, high degree AV block, pauses >3 seconds.  Patient events correlated with normal sinus rhythm and PVCs.  Echocardiogram 11/12/2022: Normal LV size and thickness, normal LV systolic function, EF 60%. Epicardial fat. No other significant abnormality. EKG:   EKG 11/20/2022: Normal sinus rhythm at the rate of 64 bpm, incomplete right bundle branch block.  No evidence of ischemia, normal QT interval. No significant change from EKG 10/23/2021:   Assessment     ICD-10-CM   1. Coronary vasospasm (HCC)  I20.1 amLODipine (NORVASC) 5 MG tablet    2. Myocardial infarction with nonobstructive coronary arteries (HCC)  I21.9 ranolazine (RANEXA) 500 MG 12 hr tablet    aspirin (ASPIRIN CHILDRENS) 81 MG chewable tablet    3. Primary hypertension  I10        Meds ordered this encounter  Medications   amLODipine (NORVASC) 5 MG tablet    Sig: Take 1 tablet (5 mg total) by mouth every  evening. Take one tab extra for chest pain    Dispense:  90 tablet    Refill:  3   ranolazine (RANEXA) 500 MG 12 hr tablet    Sig: Take 1 tablet (500 mg total) by mouth 2 (two) times daily.    Dispense:  180 tablet    Refill:  3   aspirin (ASPIRIN CHILDRENS) 81 MG chewable tablet    Sig: Chew 1 tablet (81 mg total) by mouth daily.    No orders of the defined types were placed in this encounter.  Recommendations:   CRYSTALLYNN Warner is a 67 y.o. Caucasian female patient with hypertension, hyperlipidemia, hyperglycemia, history of ischemic stroke in 2018, hypothyroidism and GERD and migraine headaches, obstructive sleep apnea on CPAP presented with NSTEMI on 04/18/2021 and again on 11/12/2022, cardiac catheterization revealing no significant disease.  Patient's presentation was consistent with coronary vasospasm as she had an episode of chest pain while in our office relieved with sublingual nitroglycerin.  1. Coronary vasospasm (HCC) Patient presently since addition of Ranexa and adding amlodipine, patient's symptoms of angina has completely resolved.  Advised her that she can certainly take 1 extra dose of the amlodipine if she were to ever have recurrence of angina pectoris if nitroglycerin does not give full relief.  - amLODipine (NORVASC) 5 MG tablet; Take 1 tablet (5 mg total) by mouth every evening. Take one tab extra for chest pain  Dispense: 90 tablet; Refill: 3  2. Myocardial infarction with nonobstructive coronary arteries (HCC) Again as discussed above, patient's coronary event occurred with essentially normal coronary arteries, will discontinue Plavix and switch her back to aspirin 81 mg daily.  - ranolazine (RANEXA) 500 MG 12 hr tablet; Take 1 tablet (500 mg total) by mouth 2 (two) times daily.  Dispense: 180 tablet; Refill: 3 - aspirin (ASPIRIN CHILDRENS) 81 MG chewable tablet; Chew 1 tablet (81 mg total) by mouth daily.  3. Primary hypertension Blood pressure is  well-controlled on present medical therapy, continue the same.  Overall stable from cardiac standpoint, I will see her back in 6 months or sooner if problems.  I will consider  addition of ACE inhibitors or ARB if blood pressure tolerates on her next office visit.    Yates Decamp, PA-C 12/29/2022, 1:45 PM Office: [phone]

## 2023-01-07 ENCOUNTER — Ambulatory Visit: Payer: Medicare HMO | Admitting: Cardiology

## 2023-04-08 ENCOUNTER — Other Ambulatory Visit: Payer: Self-pay

## 2023-04-08 MED ORDER — NITROGLYCERIN 0.4 MG SL SUBL: 0.4000 mg | SUBLINGUAL_TABLET | SUBLINGUAL | 3 refills | Status: DC | PRN

## 2023-04-09 ENCOUNTER — Other Ambulatory Visit: Payer: Self-pay

## 2023-04-09 MED ORDER — NITROGLYCERIN 0.4 MG SL SUBL: 0.4000 mg | SUBLINGUAL_TABLET | SUBLINGUAL | 3 refills | Status: AC | PRN

## 2023-05-12 ENCOUNTER — Other Ambulatory Visit: Payer: Self-pay | Admitting: Cardiology

## 2023-05-12 DIAGNOSIS — I219 Acute myocardial infarction, unspecified: Secondary | ICD-10-CM

## 2023-05-30 ENCOUNTER — Encounter: Payer: Self-pay | Admitting: Cardiology

## 2023-06-17 NOTE — Progress Notes (Signed)
Cardiology Office Note:  .   Date:  06/30/2023  ID:  KEMELY ANTIN, DOB 03-01-1956, MRN [id number] PCP: Richmond Campbell PA-C  [facility] Providers Cardiologist:  Yates Decamp, MD    History of Present Illness: .   Kelly Warner is a 67 y.o. female with hypertension, hyperlipidemia, hyperglycemia, history of ischemic stroke in 2018, hypothyroidism and GERD and migraine headaches, obstructive sleep apnea on CPAP presented to [facility] on 04/18/2021 with chest pain and positive troponins, Cardiac catheterization revealing very mild CAD, small vessel disease, recommended medical therapy, Patient presented with chest pain again on 11/12/2022 while in [place] and ended up having small NSTEMI with troponin peaking at 111.nonobstructive CAD felt to have vasospasm.Started on ranolazine and amlodipine.  Patient called in saying BP and HR trending high. Started [place] in June and as lost 36 lbs.  Patient says she had a fever and BP up for a couple days but now has come down to 120/80's. P-80's. Denies chest pain, dyspnea, edema, dizziness. Occasional heart flutter if she drinks caffeine. She walks 2 miles 4-5 times a week. Had labs at PCP yest but I can't view in symptoms. Not tolerating CPAP.    ROS:    Studies Reviewed: Marland Kitchen         Prior CV Studies:   Coronary angiogram 08/14/2021: LM: Normal LAD: No disease in LAD. Very small diagonal vessels (<1 mm) with no signifiant disease Lcx: No disease in Lcx. Small OM branches (<2 mm), no significant disease RCA: No disease in RCA. Small branches off PDA/PLA, no significant disease   Normal LVEDP Overall presentation c/w MINOCA. Added low dose Imdur, in addition to atenolol. Continue DAPT for a year   Ambulatory cardiac telemetry 7 days 08/27/2021: Predominant underlying rhythm was sinus with intermittent first-degree AV block.  Patient had PACs and PVCs both with ventricular bigeminy and trigeminy present.  No significant  cardiac arrhythmias.  No evidence of atrial fibrillation, high degree AV block, pauses >3 seconds.  Patient events correlated with normal sinus rhythm and PVCs.   Echocardiogram 11/12/2022: Normal LV size and thickness, normal LV systolic function, EF 60%. Epicardial fat. No other significant abnormality.  Risk Assessment/Calculations:             Physical Exam:   VS:  BP 114/82   Pulse 81   Ht 5\' 4"  (1.626 m)   Wt 159 lb (72.1 kg)   SpO2 97%   BMI 27.29 kg/m    Wt Readings from Last 3 Encounters:  06/30/23 159 lb (72.1 kg)  12/29/22 194 lb (88 kg)  11/20/22 193 lb (87.5 kg)    GEN: Well nourished, well developed in no acute distress NECK: No JVD; No carotid bruits CARDIAC:  RRR, no murmurs, rubs, gallops RESPIRATORY:  Clear to auscultation without rales, wheezing or rhonchi  ABDOMEN: Soft, non-tender, non-distended EXTREMITIES:  No edema; No deformity   ASSESSMENT AND PLAN: .   Coronary vasospasm with history of NSTEMI and nonobstructive disease on cath-no chest pain on amlodipine and ranexa. Patient pulled up labs down yesterday on her phone for me to review and all stable, A1C down to 5.5  HTN-recently trending high when she had a fever but normal now. No changes  HLD LDL 56 in June on lipitor  Ischemic stroke 2018  OSA  CPAP doesn't tolerate CPAP. Has lost 36 lbs and interested in another sleep study or f/u with ENT(she sees one at [facility]). We don't manage her sleep so  she'll f/u with who manages.        Dispo:    Signed, Jacolyn Reedy, PA-C

## 2023-06-30 ENCOUNTER — Encounter: Payer: Self-pay | Admitting: Physician Assistant

## 2023-06-30 ENCOUNTER — Ambulatory Visit: Payer: Medicare HMO | Attending: Cardiology | Admitting: Physician Assistant

## 2023-06-30 ENCOUNTER — Ambulatory Visit: Payer: Self-pay | Admitting: Cardiology

## 2023-06-30 VITALS — BP 114/82 | HR 81 | Ht 64.0 in | Wt 159.0 lb

## 2023-06-30 DIAGNOSIS — Z8673 Personal history of transient ischemic attack (TIA), and cerebral infarction without residual deficits: Secondary | ICD-10-CM

## 2023-06-30 DIAGNOSIS — G4733 Obstructive sleep apnea (adult) (pediatric): Secondary | ICD-10-CM

## 2023-06-30 DIAGNOSIS — I201 Angina pectoris with documented spasm: Secondary | ICD-10-CM | POA: Diagnosis not present

## 2023-06-30 DIAGNOSIS — I10 Essential (primary) hypertension: Secondary | ICD-10-CM | POA: Diagnosis not present

## 2023-06-30 DIAGNOSIS — E7800 Pure hypercholesterolemia, unspecified: Secondary | ICD-10-CM | POA: Diagnosis not present

## 2023-06-30 NOTE — Patient Instructions (Signed)
Medication Instructions:  Your physician recommends that you continue on your current medications as directed. Please refer to the Current Medication list given to you today.  *If you need a refill on your cardiac medications before your next appointment, please call your pharmacy*   Lab Work: NONE If you have labs (blood work) drawn today and your tests are completely normal, you will receive your results only by: MyChart Message (if you have MyChart) OR A paper copy in the mail If you have any lab test that is abnormal or we need to change your treatment, we will call you to review the results.   Testing/Procedures: NONE   Follow-Up: At [facility], you and your health needs are our priority.  As part of our continuing mission to provide you with exceptional heart care, we have created designated Provider Care Teams.  These Care Teams include your primary Cardiologist (physician) and Advanced Practice Providers (APPs -  Physician Assistants and Nurse Practitioners) who all work together to provide you with the care you need, when you need it.  We recommend signing up for the patient portal called "MyChart".  Sign up information is provided on this After Visit Summary.  MyChart is used to connect with patients for Virtual Visits (Telemedicine).  Patients are able to view lab/test results, encounter notes, upcoming appointments, etc.  Non-urgent messages can be sent to your provider as well.   To learn more about what you can do with MyChart, go to [email or web address].    Your next appointment:   6 month(s)  Provider:   DR. Jacinto Halim

## 2023-07-09 ENCOUNTER — Telehealth: Payer: Self-pay | Admitting: Pulmonary Disease

## 2023-07-09 NOTE — Telephone Encounter (Signed)
Pt needs a updated sleep study due to losing 40lbs

## 2023-07-10 NOTE — Telephone Encounter (Signed)
Called and scheduled pt a OV. Pt LOV was 11/25/21. NFN

## 2023-09-05 IMAGING — CR DG CHEST 2V
2 series · 2 of 2 positions shown · non-contrast
Comparison: None.

CLINICAL DATA: Sudden onset of chest pain 1 hour ago

EXAM:
CHEST - 2 VIEW

[chest pa]
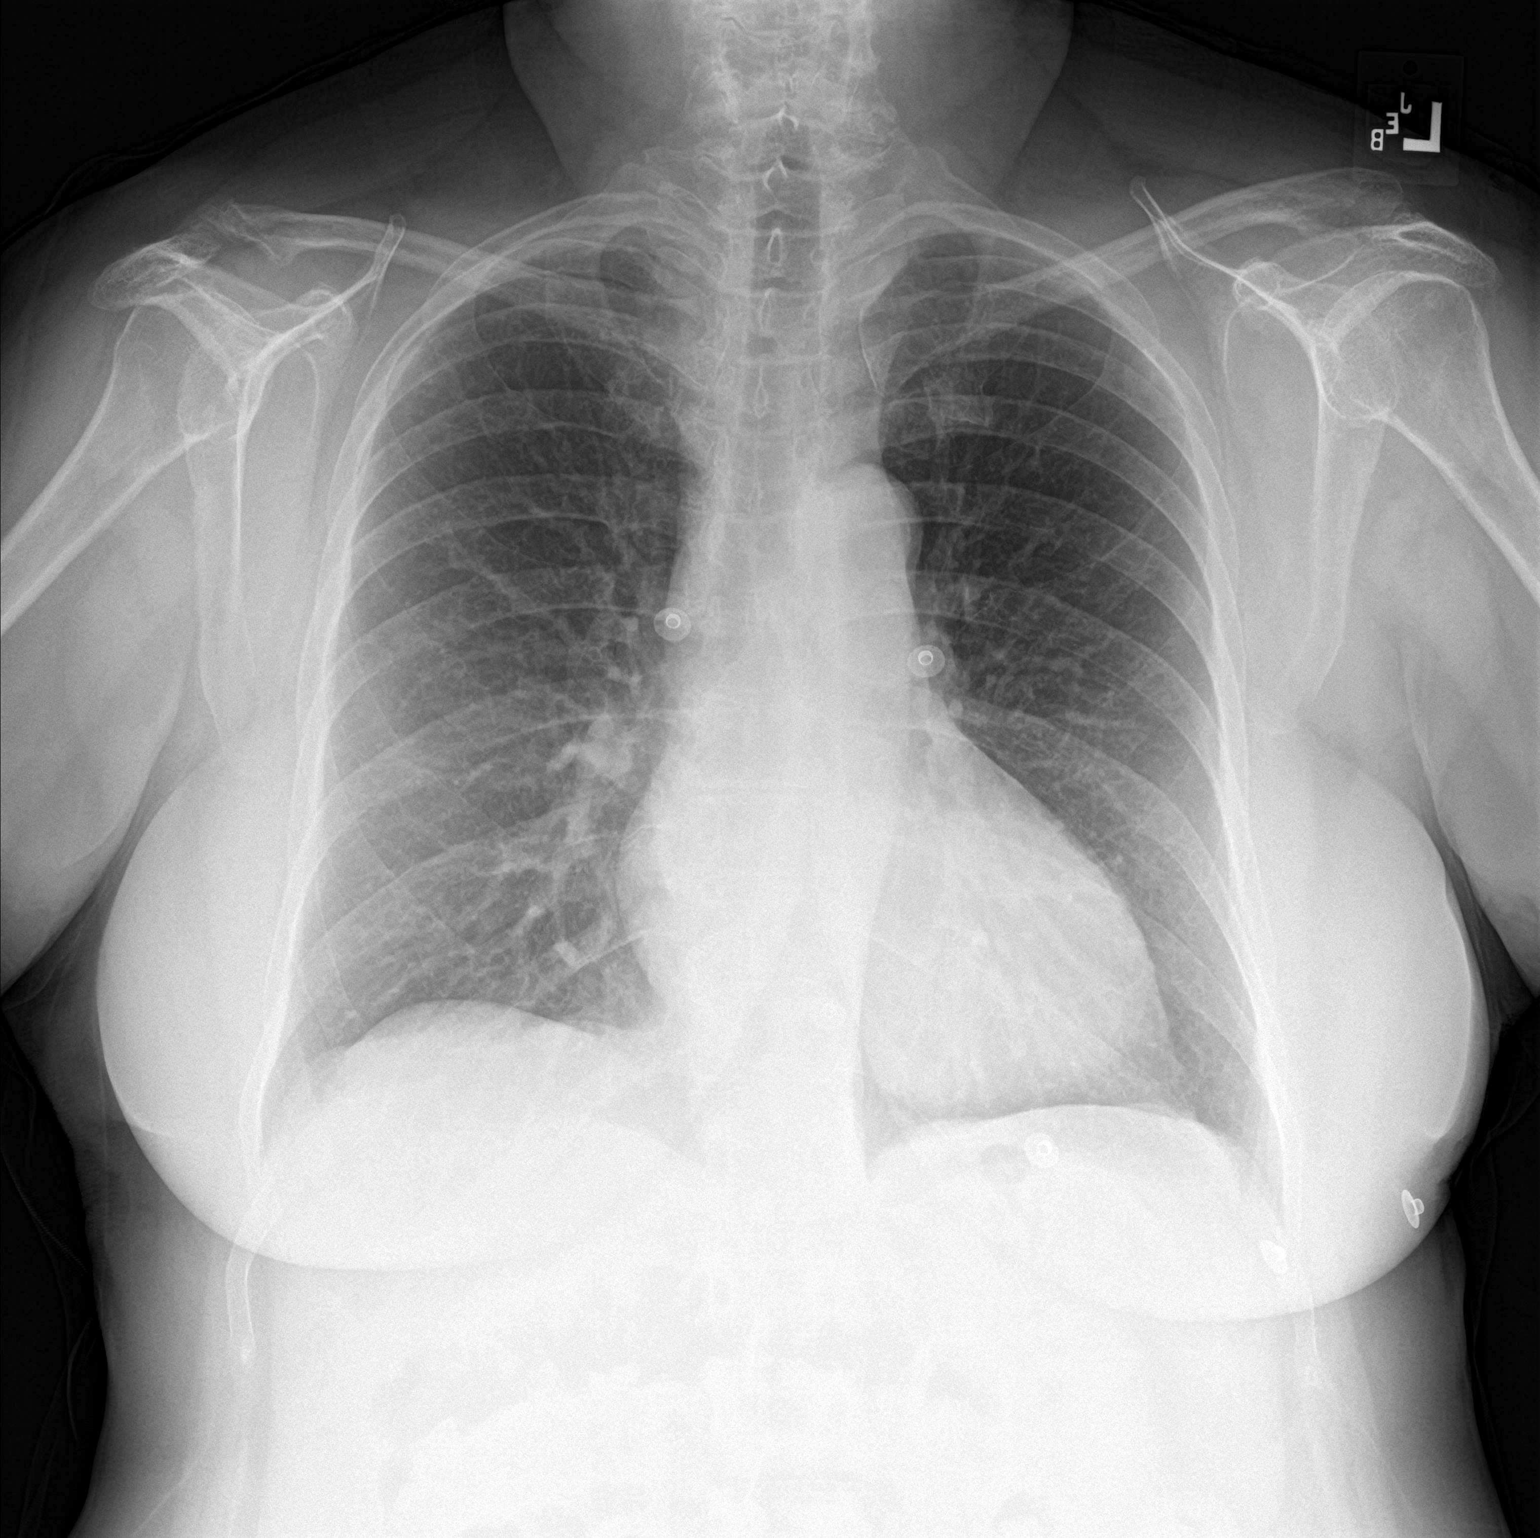

[chest lat]
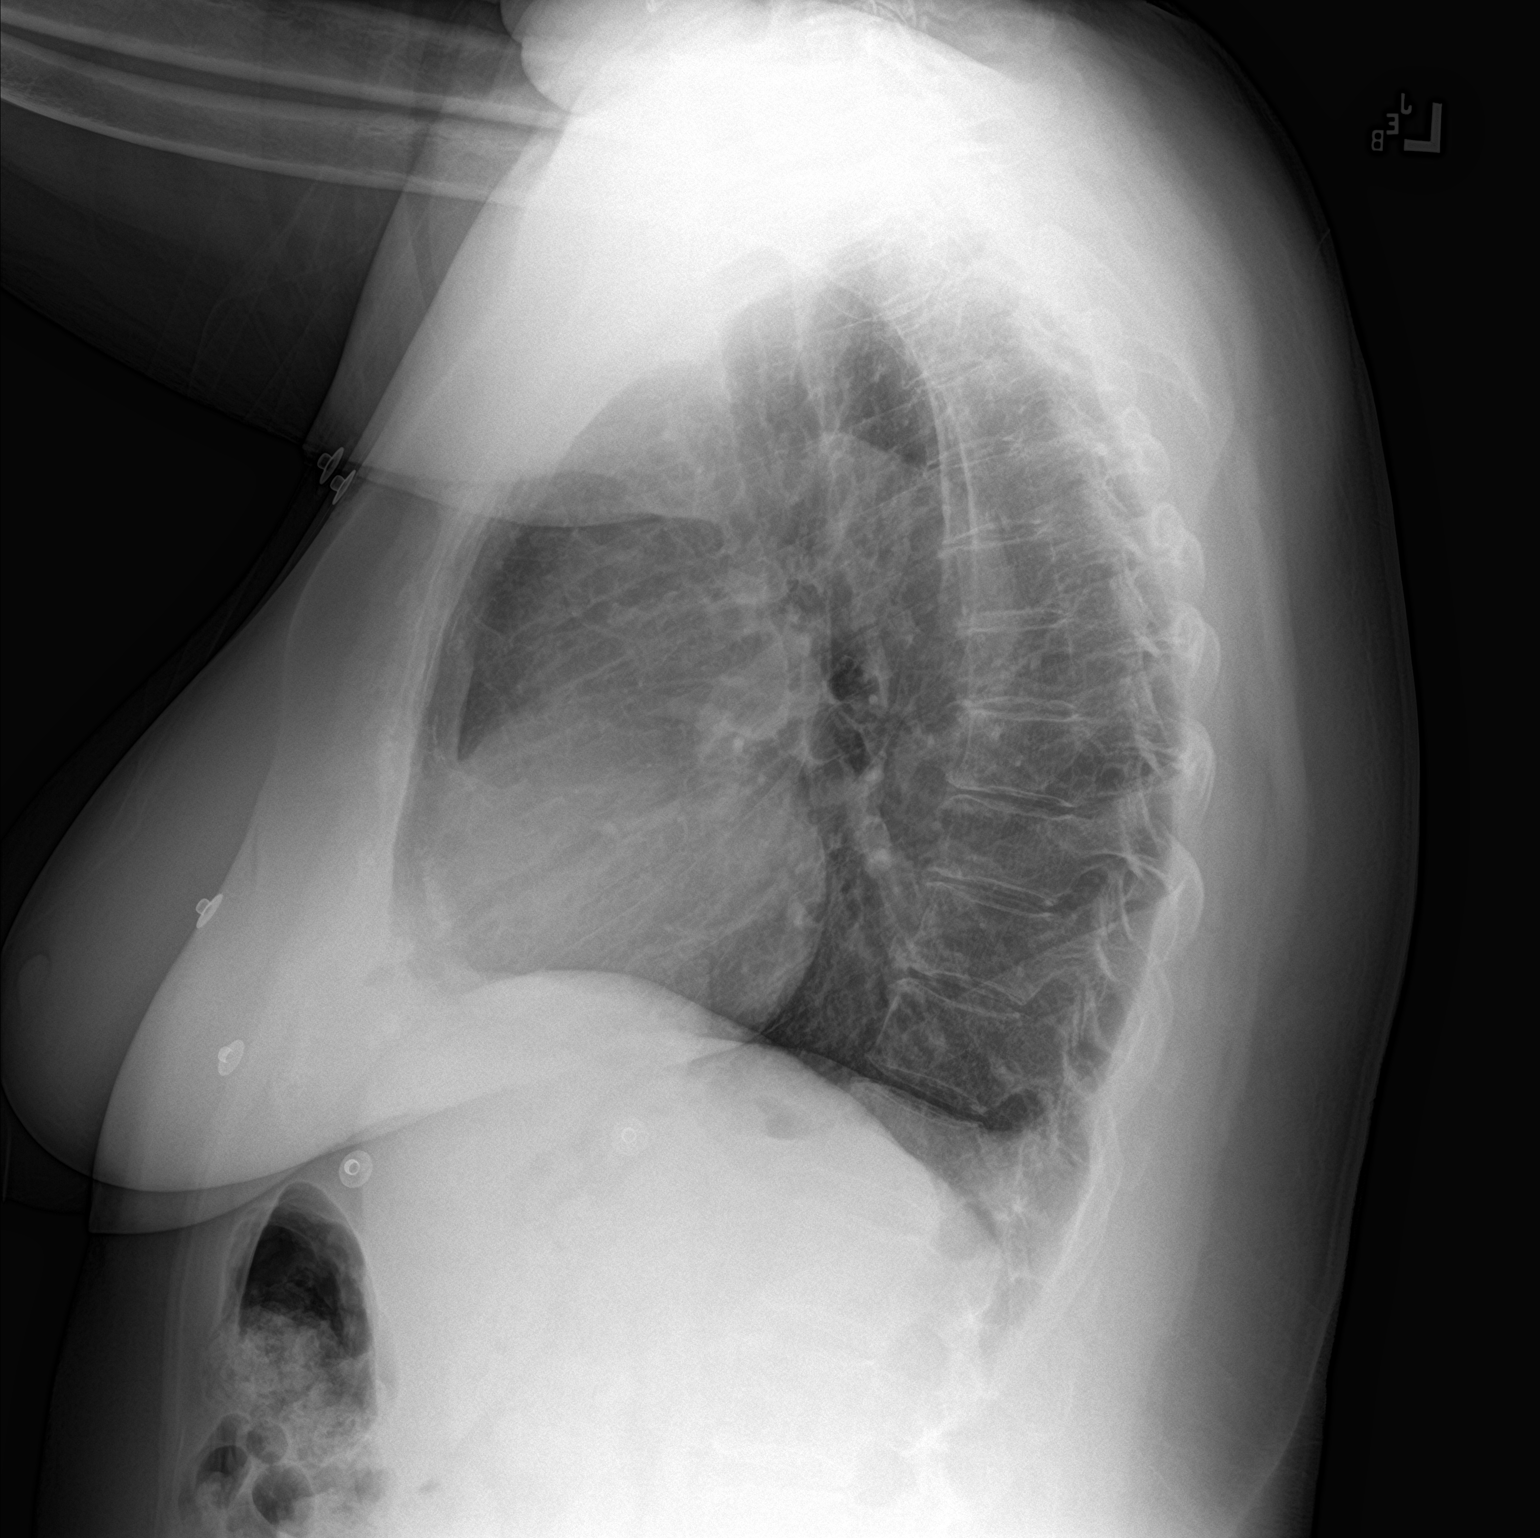

[2 of 2 positions shown; findings below may reference images not displayed]

FINDINGS: Heart size is normal. Mediastinal shadows are normal. The lungs are
clear. No bronchial thickening. No infiltrate, mass, effusion or
collapse. Pulmonary vascularity is normal. No bony abnormality.
IMPRESSION: Normal chest

## 2023-09-10 ENCOUNTER — Other Ambulatory Visit: Payer: Self-pay

## 2023-09-10 DIAGNOSIS — I219 Acute myocardial infarction, unspecified: Secondary | ICD-10-CM

## 2023-09-10 MED ORDER — RANOLAZINE ER 500 MG PO TB12: 500.0000 mg | ORAL_TABLET | Freq: Two times a day (BID) | ORAL | 2 refills | Status: DC

## 2023-09-14 ENCOUNTER — Other Ambulatory Visit: Payer: Self-pay

## 2023-09-14 DIAGNOSIS — I219 Acute myocardial infarction, unspecified: Secondary | ICD-10-CM

## 2023-09-14 MED ORDER — RANOLAZINE ER 500 MG PO TB12: 500.0000 mg | ORAL_TABLET | Freq: Two times a day (BID) | ORAL | 2 refills | Status: DC

## 2023-09-29 ENCOUNTER — Ambulatory Visit: Payer: Medicare HMO | Admitting: Pulmonary Disease

## 2023-10-08 ENCOUNTER — Encounter: Payer: Self-pay | Admitting: Cardiology

## 2023-11-05 ENCOUNTER — Encounter: Payer: Self-pay | Admitting: Pulmonary Disease

## 2023-11-16 ENCOUNTER — Other Ambulatory Visit: Payer: Self-pay | Admitting: Otolaryngology

## 2023-11-26 ENCOUNTER — Ambulatory Visit (HOSPITAL_COMMUNITY): Admitting: Anesthesiology

## 2023-11-26 ENCOUNTER — Other Ambulatory Visit: Payer: Self-pay

## 2023-11-26 ENCOUNTER — Encounter (HOSPITAL_COMMUNITY)
Admission: RE | Disposition: A | Discharge status: Home / Self Care | Payer: Self-pay | Source: Home / Self Care | Attending: Otolaryngology

## 2023-11-26 ENCOUNTER — Encounter (HOSPITAL_COMMUNITY): Payer: Self-pay | Admitting: Otolaryngology

## 2023-11-26 ENCOUNTER — Ambulatory Visit (HOSPITAL_COMMUNITY)
Admission: RE | Admit: 2023-11-26 | Discharge: 2023-11-26 | Disposition: A | Discharge status: Home / Self Care | Attending: Otolaryngology | Admitting: Otolaryngology

## 2023-11-26 DIAGNOSIS — G4733 Obstructive sleep apnea (adult) (pediatric): Secondary | ICD-10-CM | POA: Insufficient documentation

## 2023-11-26 DIAGNOSIS — G4730 Sleep apnea, unspecified: Secondary | ICD-10-CM | POA: Diagnosis present

## 2023-11-26 DIAGNOSIS — I2510 Atherosclerotic heart disease of native coronary artery without angina pectoris: Secondary | ICD-10-CM

## 2023-11-26 DIAGNOSIS — I10 Essential (primary) hypertension: Secondary | ICD-10-CM

## 2023-11-26 DIAGNOSIS — E039 Hypothyroidism, unspecified: Secondary | ICD-10-CM

## 2023-11-26 HISTORY — PX: DRUG INDUCED ENDOSCOPY: SHX6808

## 2023-11-26 SURGERY — DRUG INDUCED SLEEP ENDOSCOPY
Anesthesia: Monitor Anesthesia Care | Laterality: Bilateral

## 2023-11-26 MED ORDER — PROPOFOL 10 MG/ML IV BOLUS
INTRAVENOUS | Status: DC | PRN
  Administered 2023-11-26 (×2): 10 mg via INTRAVENOUS

## 2023-11-26 MED ORDER — OXYMETAZOLINE HCL 0.05 % NA SOLN
NASAL | Status: DC | PRN
  Administered 2023-11-26: 1 via TOPICAL

## 2023-11-26 MED ORDER — SODIUM CHLORIDE 0.9 % IV SOLN: INTRAVENOUS | Status: DC | PRN

## 2023-11-26 MED ORDER — OXYMETAZOLINE HCL 0.05 % NA SOLN
NASAL | Status: AC
Start: 2023-11-26 — End: ?
  Filled 2023-11-26: qty 30

## 2023-11-26 MED ORDER — PROPOFOL 500 MG/50ML IV EMUL
INTRAVENOUS | Status: DC | PRN
  Administered 2023-11-26: 100 ug/kg/min via INTRAVENOUS

## 2023-11-26 NOTE — H&P (Signed)
 Kelly Warner is an 68 y.o. female.   Chief Complaint: Sleep apnea HPI: 68 year old female with sleep apnea who has been unable to tolerate CPAP.  Past Medical History:  Diagnosis Date   Coronary artery disease    GERD (gastroesophageal reflux disease)    Hypertension    Migraines    NSTEMI (non-ST elevation myocardial infarction) (HCC)    Stroke [facility])     Past Surgical History:  Procedure Laterality Date   ABDOMINAL HYSTERECTOMY     CARDIAC CATHETERIZATION     COLONOSCOPY WITH PROPOFOL N/A 04/19/2020   Procedure: COLONOSCOPY WITH PROPOFOL;  Surgeon: Charna Elizabeth, MD;  Location: [facility] ENDOSCOPY;  Service: Endoscopy;  Laterality: N/A;   LEFT HEART CATH AND CORONARY ANGIOGRAPHY N/A 08/14/2021   Procedure: LEFT HEART CATH AND CORONARY ANGIOGRAPHY;  Surgeon: Elder Negus, MD;  Location: [facility] INVASIVE CV LAB;  Service: Cardiovascular;  Laterality: N/A;   NASAL FRACTURE SURGERY     POLYPECTOMY  04/19/2020   Procedure: POLYPECTOMY;  Surgeon: Charna Elizabeth, MD;  Location: [facility] ENDOSCOPY;  Service: Endoscopy;;   SHOULDER ARTHROSCOPY Right    TONSILLECTOMY      Family History  Problem Relation Age of Onset   Atrial fibrillation Mother 46   Hypertension Mother    Heart attack Father 38   Atrial fibrillation Sister 74   Atrial fibrillation Brother 50   Hypertension Brother    Social History:  reports that she has never smoked. She has never used smokeless tobacco. She reports current alcohol use. She reports that she does not use drugs.  Allergies:  Allergies  Allergen Reactions   Isosorbide Nitrate Other (See Comments)    Headache, nausea and vomiting.   Oxybutynin Other (See Comments)    Headaches   Headaches   Lisinopril Other (See Comments)    Incontinence   Incontinence   Lactose Intolerance (Gi) Other (See Comments)    GI upset    Medications Prior to Admission  Medication Sig Dispense Refill   aspirin (ASPIRIN CHILDRENS) 81 MG chewable tablet Chew 1 tablet  (81 mg total) by mouth daily.     atorvastatin (LIPITOR) 20 MG tablet Take 20 mg by mouth every evening.     azelastine (ASTELIN) 0.1 % nasal spray Place 2 sprays into both nostrils 2 (two) times daily. Use in each nostril as directed     citalopram (CELEXA) 10 MG tablet Take 10 mg by mouth daily.     dexlansoprazole (DEXILANT) 60 MG capsule Take 60 mg by mouth at bedtime.     fexofenadine (ALLEGRA) 180 MG tablet Take 180 mg by mouth as needed.     levothyroxine (SYNTHROID) 25 MCG tablet Take 25 mcg by mouth daily before breakfast.     mometasone (NASONEX) 50 MCG/ACT nasal spray Place 2 sprays into the nose as needed.     Polyethyl Glycol-Propyl Glycol (SYSTANE) 0.4-0.3 % SOLN Place 1-2 drops into both eyes 3 (three) times daily as needed (dry/irritated eyes.).     ranolazine (RANEXA) 500 MG 12 hr tablet Take 1 tablet (500 mg total) by mouth 2 (two) times daily. 180 tablet 2   amLODipine (NORVASC) 5 MG tablet Take 1 tablet (5 mg total) by mouth every evening. Take one tab extra for chest pain 90 tablet 3   montelukast (SINGULAIR) 10 MG tablet Take 10 mg by mouth at bedtime. (Patient not taking: Reported on 06/30/2023)     nitroGLYCERIN (NITROSTAT) 0.4 MG SL tablet Place 1 tablet (0.4 mg  total) under the tongue every 5 (five) minutes as needed for chest pain. 30 tablet 3   WEGOVY 0.25 MG/0.5ML SOAJ Inject into the skin.      No results found for this or any previous visit (from the past 48 hours). No results found.  Review of Systems  All other systems reviewed and are negative.   There were no vitals taken for this visit. Physical Exam Constitutional:      Appearance: Normal appearance.  HENT:     Head: Normocephalic and atraumatic.     Right Ear: External ear normal.     Left Ear: External ear normal.     Nose: Nose normal.     Mouth/Throat:     Mouth: Mucous membranes are moist.     Pharynx: Oropharynx is clear.  Eyes:     Extraocular Movements: Extraocular movements intact.      Pupils: Pupils are equal, round, and reactive to light.  Cardiovascular:     Rate and Rhythm: Normal rate.  Pulmonary:     Effort: Pulmonary effort is normal.  Skin:    General: Skin is warm and dry.  Neurological:     General: No focal deficit present.     Mental Status: She is alert and oriented to person, place, and time.  Psychiatric:        Mood and Affect: Mood normal.        Behavior: Behavior normal.        Thought Content: Thought content normal.        Judgment: Judgment normal.      Assessment/Plan Obstructive sleep apnea and BMI 28.  To OR for sleep endoscopy.  Christia Reading, MD 11/26/2023, 12:33 PM

## 2023-11-26 NOTE — Transfer of Care (Signed)
 Immediate Anesthesia Transfer of Care Note  Patient: Kelly Warner  Procedure(s) Performed: DRUG INDUCED SLEEP ENDOSCOPY (Bilateral)  Patient Location: PACU  Anesthesia Type:MAC  Level of Consciousness: awake, alert , and oriented  Airway & Oxygen Therapy: Patient Spontanous Breathing  Post-op Assessment: Report given to RN and Post -op Vital signs reviewed and stable  Post vital signs: Reviewed and stable  Last Vitals:  Vitals Value Taken Time  BP 104/62 11/26/23 1325  Temp 36.1 C 11/26/23 1325  Pulse 69 11/26/23 1325  Resp 22 11/26/23 1325  SpO2 94 % 11/26/23 1325  Vitals shown include unfiled device data.  Last Pain:  Vitals:   11/26/23 1325  TempSrc: Temporal  PainSc: 0-No pain         Complications: No notable events documented.

## 2023-11-26 NOTE — Anesthesia Preprocedure Evaluation (Addendum)
 Anesthesia Evaluation  Patient identified by MRN, date of birth, ID band Patient awake    Reviewed: Allergy & Precautions, NPO status , Patient's Chart, lab work & pertinent test results  History of Anesthesia Complications Negative for: history of anesthetic complications  Airway Mallampati: II  TM Distance: >3 FB Neck ROM: Full    Dental  (+) Dental Advisory Given, Teeth Intact   Pulmonary sleep apnea    Pulmonary exam normal        Cardiovascular hypertension, + CAD and + Past MI  Normal cardiovascular exam     Neuro/Psych  Headaches CVA, No Residual Symptoms  negative psych ROS   GI/Hepatic Neg liver ROS,GERD  Controlled and Medicated,,  Endo/Other  Hypothyroidism    Renal/GU negative Renal ROS     Musculoskeletal negative musculoskeletal ROS (+)    Abdominal   Peds  Hematology negative hematology ROS (+)   Anesthesia Other Findings On GLP-1a   Reproductive/Obstetrics                             Anesthesia Physical Anesthesia Plan  ASA: 3  Anesthesia Plan: MAC   Post-op Pain Management: Minimal or no pain anticipated   Induction:   PONV Risk Score and Plan: 2 and Propofol infusion and Treatment may vary due to age or medical condition  Airway Management Planned: Nasal Cannula and Natural Airway  Additional Equipment: None  Intra-op Plan:   Post-operative Plan:   Informed Consent: I have reviewed the patients History and Physical, chart, labs and discussed the procedure including the risks, benefits and alternatives for the proposed anesthesia with the patient or authorized representative who has indicated his/her understanding and acceptance.       Plan Discussed with: CRNA and Anesthesiologist  Anesthesia Plan Comments:         Anesthesia Quick Evaluation

## 2023-11-26 NOTE — Anesthesia Postprocedure Evaluation (Signed)
 Anesthesia Post Note  Patient: Kelly Warner  Procedure(s) Performed: DRUG INDUCED SLEEP ENDOSCOPY (Bilateral)     Patient location during evaluation: PACU Anesthesia Type: MAC Level of consciousness: awake and alert Pain management: pain level controlled Vital Signs Assessment: post-procedure vital signs reviewed and stable Respiratory status: spontaneous breathing, nonlabored ventilation and respiratory function stable Cardiovascular status: stable and blood pressure returned to baseline Anesthetic complications: no  No notable events documented.  Last Vitals:  Vitals:   11/26/23 1330 11/26/23 1340  BP: 99/75   Pulse: 67 66  Resp: 13 17  Temp:    SpO2: 99% 99%    Last Pain:  Vitals:   11/26/23 1330  TempSrc:   PainSc: 0-No pain                 Beryle Lathe

## 2023-11-26 NOTE — Op Note (Signed)
Preop diagnosis: Obstructive sleep apnea ?Postop diagnosis: same ?Procedure: Drug-induced sleep endoscopy ?Surgeon: Kindell Strada ?Anesth: IV sedation ?Compl: None ?Findings: There is 100% anterior-posterior collapse at the velum making her a candidate for hypoglossal nerve stimulator placement.  There was also anterior-posterior collapse at the tongue base. ?Description:  After discussing risks, benefits, and alternatives, the patient was brought to the operative suite and placed on the operative table in the supine position.  Anesthesia was induced and the patient was given light sedation to simulate natural sleep. When the proper level was reached, an Afrin-soaked pledget was placed in the right nasal passage for a couple of minutes and then removed.  The fiberoptic laryngoscope was then passed to view the pharynx and larynx.  Findings are noted above and the exam was recorded.  After completion, the scope was removed and the patient was returned to anesthesia for wakeup and was moved to the recovery room in stable condition. ? ?

## 2024-03-11 ENCOUNTER — Encounter: Payer: Self-pay | Admitting: Cardiology

## 2024-03-11 ENCOUNTER — Ambulatory Visit: Attending: Cardiology | Admitting: Cardiology

## 2024-03-11 VITALS — BP 118/68 | HR 69 | Resp 16 | Ht 64.0 in | Wt 141.8 lb

## 2024-03-11 DIAGNOSIS — E7800 Pure hypercholesterolemia, unspecified: Secondary | ICD-10-CM | POA: Diagnosis not present

## 2024-03-11 DIAGNOSIS — I10 Essential (primary) hypertension: Secondary | ICD-10-CM

## 2024-03-11 DIAGNOSIS — I201 Angina pectoris with documented spasm: Secondary | ICD-10-CM | POA: Diagnosis not present

## 2024-03-11 DIAGNOSIS — Z8673 Personal history of transient ischemic attack (TIA), and cerebral infarction without residual deficits: Secondary | ICD-10-CM

## 2024-03-11 MED ORDER — ASPIRIN 81 MG PO CHEW: 81.0000 mg | CHEWABLE_TABLET | ORAL | Status: AC

## 2024-03-11 MED ORDER — AMLODIPINE BESYLATE 5 MG PO TABS: 2.5000 mg | ORAL_TABLET | Freq: Every evening | ORAL | Status: DC

## 2024-03-11 NOTE — Progress Notes (Unsigned)
 Cardiology Office Note:  .   Date:  03/12/2024  ID:  Kelly Warner, DOB 1956-05-29, MRN [id number] PCP: Debrah Josette MOHR PA-C  [facility] Providers Cardiologist:  Gordy Bergamo, MD   History of Present Illness: .   Kelly Warner is a 68 y.o. female with hypertension, hyperlipidemia, hyperglycemia, history of ischemic stroke in 2018, hypothyroidism and GERD and migraine headaches, obstructive sleep apnea on CPAP presented to [facility] on 04/18/2021 with chest pain and positive troponins, Cardiac catheterization revealing very mild CAD, small vessel disease, recommended medical therapy, Patient presented with chest pain again on 11/12/2022 while in [place]  and ended up having small NSTEMI with troponin peaking at 111.nonobstructive CAD felt to have vasospasm. Started on ranolazine  and amlodipine .  She has lost about 55 pounds in weight with GLP-1 agonist and lifestyle modification and feels the best she has in a while.  She has had very minimal chest pain and thinks that this may be GI related but she did take nitroglycerin .  She has no limitations with regard to activity otherwise essentially asymptomatic.  Discussed the use of AI scribe software for clinical note transcription with the patient, who gave verbal consent to proceed.  History of Present Illness Kelly Warner is a 68 year old female with coronary artery disease and ischemic stroke who presents for a cardiovascular follow-up.  She has used nitroglycerin  pills twice since her last myocardial infarction, which occurred a year and four months ago. She sometimes experiences chest pain that she cannot distinguish from indigestion, prompting her to take nitroglycerin  as a precaution. No continuous chest pain has been experienced since then.  Her current medications include ranolazine  500 mg twice daily, amlodipine , and aspirin  81 mg daily. She experienced an ischemic stroke in 2018 with left-sided weakness of the  upper and lower extremities but reports no lasting side effects from the stroke.  She has lost approximately 55 pounds and feels much better, noting improved physical activity and a significant decrease in her A1c.  Labs    External Labs:  PCP labs on Care Everywhere 02/24/2024:  BUN 14, creatinine 0.76, EGFR 85 mL, LFTs normal.  Total cholesterol 141, triglycerides 100, HDL 63, LDL 59.  TSH normal at 2.086.  A1c 5.1%.  CBC normal.  ROS  Review of Systems  Cardiovascular:  Negative for chest pain, dyspnea on exertion and leg swelling.   Physical Exam:   VS:  BP 118/68 (BP Location: Left Arm, Patient Position: Sitting, Cuff Size: Normal)   Pulse 69   Resp 16   Ht 5' 4 (1.626 m)   Wt 141 lb 12.8 oz (64.3 kg)   SpO2 96%   BMI 24.34 kg/m    Wt Readings from Last 3 Encounters:  03/11/24 141 lb 12.8 oz (64.3 kg)  11/26/23 142 lb (64.4 kg)  06/30/23 159 lb (72.1 kg)    Physical Exam Neck:     Vascular: No carotid bruit or JVD.  Cardiovascular:     Rate and Rhythm: Normal rate and regular rhythm.     Pulses: Intact distal pulses.     Heart sounds: Normal heart sounds. No murmur heard.    No gallop.  Pulmonary:     Effort: Pulmonary effort is normal.     Breath sounds: Normal breath sounds.  Abdominal:     General: Bowel sounds are normal.     Palpations: Abdomen is soft.  Musculoskeletal:     Right lower leg: No edema.  Left lower leg: No edema.    Studies Reviewed: SABRA     EKG:    EKG Interpretation Date/Time:  Friday March 11 2024 14:04:44 EDT Ventricular Rate:  73 PR Interval:  204 QRS Duration:  88 QT Interval:  402 QTC Calculation: 442 R Axis:   -39  Text Interpretation: EKG 03/11/2024: Normal sinus rhythm at a rate of 73 bpm, left anterior fascicular block.  No evidence of ischemia.  Compared to 11/20/2022, leftward axis is new. Confirmed by Harneet Noblett, Jagadeesh ([id number]) on 03/11/2024 2:22:54 PM    Medications ordered    Meds ordered this encounter   Medications   aspirin  (ASPIRIN  CHILDRENS) 81 MG chewable tablet    Sig: Chew 1 tablet (81 mg total) by mouth every other day.   amLODipine  (NORVASC ) 5 MG tablet    Sig: Take 0.5 tablets (2.5 mg total) by mouth every evening. Take one tab extra for chest pain    ASSESSMENT AND PLAN: .      ICD-10-CM   1. Coronary vasospasm (HCC)  I20.1 EKG 12-Lead    amLODipine  (NORVASC ) 5 MG tablet    2. Primary hypertension  I10     3. Pure hypercholesterolemia  E78.00     4. 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 Intermittent angina managed with nitroglycerin , possibly related to indigestion. Significant weight loss of 55 pounds has improved cardiovascular health. Currently on ranolazine  and amlodipine . - Discontinue ranolazine  and monitor for recurrence of angina over 2-3 months. - Continue amlodipine  for vasospasm prevention. - If asymptomatic after 3 months, reduce amlodipine  to half the dose and monitor for another 3-4 months.  Can potentially try to discontinue amlodipine  as well if no recurrence of angina - Continue nitroglycerin  as needed for angina. - If angina recurs, consider restarting ranolazine  or adjusting amlodipine  dosage. - Lipids well-controlled at LDL 59 on atorvastatin  20 mg daily  Ischemic stroke Ischemic stroke in 2018 with left-sided weakness, currently asymptomatic. Previously advised to take aspirin  indefinitely. Rash likely due to platelet count inhibition from aspirin . - Change aspirin  regimen to every other day due to rash and marked petechiae even with minimal touch to the skin. - Lipids are well-controlled she has no history of hypertension.  Obesity Obesity with significant weight loss of 55 pounds, improving cardiovascular health and A1c levels. - Presently on Wegovy and tolerating well with success  in weight loss.  Improved A1c levels Improved A1c levels, reducing diabetes risk and contributing to cardiovascular health. - Discuss the use of Wegovy for weight management and its benefits for coronary artery disease.   Office visit on a as needed basis.  Signed,  Gordy Bergamo, MD, [facility] 03/12/2024, 7:53 AM [facility] [address] [place], [place] [address] Phone: [phone]. Fax:  [phone]

## 2024-03-11 NOTE — Patient Instructions (Addendum)
 Medication Instructions:  Your physician has recommended you make the following change in your medication: Decrease amlodipine  to 2.5 mg by mouth daily Change aspirin  to every other day Stop Ranexa   *If you need a refill on your cardiac medications before your next appointment, please call your pharmacy*  Lab Work: none If you have labs (blood work) drawn today and your tests are completely normal, you will receive your results only by: MyChart Message (if you have MyChart) OR A paper copy in the mail If you have any lab test that is abnormal or we need to change your treatment, we will call you to review the results.  Testing/Procedures: none  Follow-Up: At [facility], you and your health needs are our priority.  As part of our continuing mission to provide you with exceptional heart care, our providers are all part of one team.  This team includes your primary Cardiologist (physician) and Advanced Practice Providers or APPs (Physician Assistants and Nurse Practitioners) who all work together to provide you with the care you need, when you need it.  Your next appointment:   As needed  Provider:   Gordy Bergamo, MD    We recommend signing up for the patient portal called MyChart.  Sign up information is provided on this After Visit Summary.  MyChart is used to connect with patients for Virtual Visits (Telemedicine).  Patients are able to view lab/test results, encounter notes, upcoming appointments, etc.  Non-urgent messages can be sent to your provider as well.   To learn more about what you can do with MyChart, go to [email or web address].   Other Instructions

## 2024-03-17 ENCOUNTER — Other Ambulatory Visit: Payer: Self-pay | Admitting: Otolaryngology

## 2024-03-30 ENCOUNTER — Encounter: Payer: Self-pay | Admitting: Cardiology

## 2024-03-30 ENCOUNTER — Other Ambulatory Visit: Payer: Self-pay

## 2024-03-30 ENCOUNTER — Telehealth: Payer: Self-pay

## 2024-03-30 ENCOUNTER — Encounter (HOSPITAL_BASED_OUTPATIENT_CLINIC_OR_DEPARTMENT_OTHER): Payer: Self-pay | Admitting: Otolaryngology

## 2024-03-30 NOTE — Progress Notes (Signed)
   03/30/24 1253  PAT Phone Screen  Is the patient taking a GLP-1 receptor agonist? (S)  Yes (Wegovy LD 03-22-24)  Has the patient been informed on holding medication? Yes  Do You Have Diabetes? No  Do You Have Hypertension? Yes  Have You Ever Been to the ER for Asthma? No  Have You Taken Oral Steroids in the Past 3 Months? No  Do you Take Phenteramine or any Other Diet Drugs? No  Recent  Lab Work, EKG, CXR? Yes  Where was this test performed? 03-11-24 EKG  Do you have a history of heart problems? (S)  Yes (+MI 08-2021, angina, no stents)  Cardiologist Name Dr Ladona  Have you ever had tests on your heart? Yes  What cardiac tests were performed? Cardiac Cath  What date/year were cardiac tests completed? 08-14-2021  Results viewable: [facility] Media Tab  Any Recent Hospitalizations? No  Height 5' 4 (1.626 m)  Weight 64.3 kg  Pat Appointment Scheduled No  Reason for No Appointment Not Needed

## 2024-03-30 NOTE — Telephone Encounter (Signed)
 See my chart message regarding issues and appointment.

## 2024-03-30 NOTE — Telephone Encounter (Signed)
   Pre-operative Risk Assessment    Patient Name: Kelly Warner  DOB: 1956-07-17 MRN: [id number]   Date of last office visit: 03/11/24 Date of next office visit: Not scheduled   Request for Surgical Clearance    Procedure:  Inspire Implant  Date of Surgery:  Clearance 04/05/24                                Surgeon:  Dr. Carlie Socks Group or Practice Name:  [facility] Phone number:  [phone] Fax number:  [phone]   Type of Clearance Requested:   - Medical  - Pharmacy:  Hold Aspirin      Type of Anesthesia:  Not Indicated   Additional requests/questions:    Kelly Warner   03/30/2024, 4:09 PM

## 2024-03-30 NOTE — Telephone Encounter (Signed)
 I will send back to preop APP for clarification as who was this pt supposed to see on 04/04/24 as it was stated to schedule 04/04/24 appt with an EKG.

## 2024-03-30 NOTE — Telephone Encounter (Signed)
 Called patient to clear for Inspire device insertion. She expressed episode of severe heartburn after eating.  Not sure if it was from food or heart related. She will need to be seen on 04/04/2024 as add on to be cleared with an EKG.  Surgery can be postponed if necessary, but it is scheduled for 04/05/2024.  She is out of town until 04/03/2024 but available by cell phone number listed.

## 2024-03-30 NOTE — Progress Notes (Signed)
 Patient's chart reviewed with Dr Niels and she states that the patient wiill need a new cards clearance since she took NTG 1 week ago. Carla at Dr Carlie office made aware.

## 2024-03-31 ENCOUNTER — Ambulatory Visit: Admitting: Emergency Medicine

## 2024-03-31 NOTE — Telephone Encounter (Signed)
 I s/w the pt and she is agreeable to [facility] office for her preop appt 04/04/24. Pt is out of town and will not be back until Sunday 04/03/24. Procedure is 04/05/24. Per preop APP pt needs to be seen and needs an EKG as well.   Our office just received request for clearance 03/30/24 for procedure 04/05/24.    I will update all parties involved of the appt.

## 2024-04-01 NOTE — Telephone Encounter (Signed)
 Per Josefa Beauvais, NP--per pre-op APP patient needs to be seen and needs an EKG as well. Patient has been notified of this. Patient expressed episode of severe heartburn after eating. Not sure if it was from food or heart related. She will need to be seen 04/04/24 as add on to be cleared with and EKG. Surgery can be postponed if necessary, but it is scheduled for 04/05/24. She I out of town until 04/03/24, but available by cell phone number listed.

## 2024-04-04 ENCOUNTER — Encounter: Payer: Self-pay | Admitting: Medical

## 2024-04-04 ENCOUNTER — Ambulatory Visit: Attending: Medical | Admitting: Medical

## 2024-04-04 VITALS — BP 100/50 | HR 77 | Ht 64.0 in | Wt 140.6 lb

## 2024-04-04 DIAGNOSIS — Z01810 Encounter for preprocedural cardiovascular examination: Secondary | ICD-10-CM

## 2024-04-04 DIAGNOSIS — R079 Chest pain, unspecified: Secondary | ICD-10-CM | POA: Diagnosis not present

## 2024-04-04 DIAGNOSIS — E782 Mixed hyperlipidemia: Secondary | ICD-10-CM

## 2024-04-04 DIAGNOSIS — I2510 Atherosclerotic heart disease of native coronary artery without angina pectoris: Secondary | ICD-10-CM

## 2024-04-04 DIAGNOSIS — I201 Angina pectoris with documented spasm: Secondary | ICD-10-CM | POA: Diagnosis not present

## 2024-04-04 DIAGNOSIS — I10 Essential (primary) hypertension: Secondary | ICD-10-CM

## 2024-04-04 DIAGNOSIS — Z8673 Personal history of transient ischemic attack (TIA), and cerebral infarction without residual deficits: Secondary | ICD-10-CM

## 2024-04-04 NOTE — Progress Notes (Signed)
 Cardiology Office Note   Date:  04/04/2024  ID:  Kelly, Warner 1956-03-12, MRN [id number] PCP: Debrah Josette ORN., PA-C  [facility] Providers Cardiologist:  Gordy Bergamo, MD    History of Present Illness Kelly Warner is a 68 y.o. female with a h/o nonobstructive CAD, coronary vasospasm, HTN, HLD, hyperglycemia, h/p ischemic stroke in 2018, hypothyroidism, GERD, migraine headaches, OSA on CPAP who presents for pre-op evaluation.   The patient presented to 04/2021 with chest pain and positive troponins. Cardiac cath revealed very mild CAD, small vessel disease, recommended medical therapy. Patient presented with chest pain 10/2022 while in [place]  and ended up having small NSTEMI with troponin peaking 111, nonobstructive CAD felt to have vasospasm. She was started on Ranolazine  and amlodipine  with resolution of symptoms. She lost about 55lbs with GLP-1 agonist and lifestyle modification.  She was last seen 03/2024 reporting intermittent angina possible related to indigestion. Ranexa  was stopped.   Today, the patient is here for pre-op exam. She is scheduled for surgery tomorrow for [facility] implant. She reports 1 episode of chest pain a few weeks ago. She was unable to come in sooner due to being on vacation. On the say she had chest discomfort she had [place] food and a margarita, which was abnormal. Following this she had an episode of chest discomfort suspected that improved with SL NTG x 2. She denies recurrent chest pain. She walks a couple miles daily and has no issues with this. She denies SOB, lower leg edema, lightheadedness, or dizziness, heart racing, or palpitations.   Studies Reviewed EKG Interpretation Date/Time:  Monday April 04 2024 11:08:49 EDT Ventricular Rate:  77 PR Interval:  196 QRS Duration:  84 QT Interval:  380 QTC Calculation: 430 R Axis:   -39  Text Interpretation: Normal sinus rhythm Left axis deviation When compared with ECG of 11-Mar-2024 14:04,  No significant change was found Confirmed by Franchester, Torie Priebe ([id number]) on 04/04/2024 11:11:39 AM    Heart monitor 08/2022 Predominant underlying rhythm was sinus with intermittent first-degree AV block.  Patient had PACs and PVCs both with ventricular bigeminy and trigeminy present.  No significant cardiac arrhythmias.  No evidence of atrial fibrillation, high degree AV block, pauses >3 seconds.  Patient events correlated with normal sinus rhythm and PVCs.   Cardiac cath 08/2021 LM: Normal LAD: No disease in LAD         Very small diagonal vessels (<1 mm) with no signifiant disease Lcx: No disease in Lcx        Small OM branches (<2 mm), no significant disease RCA: No disease in RCA         Small branches off PDA/PLA, no significant disease   Normal LVEDP Overall presentation c/w MINOCA. Added low dose Imdur , in addition to atenolol . Continue DAPT for a year       Physical Exam VS:  BP (!) 100/50 (BP Location: Left Arm, Patient Position: Sitting, Cuff Size: Normal)   Pulse 77   Ht 5' 4 (1.626 m)   Wt 140 lb 9.6 oz (63.8 kg)   SpO2 99%   BMI 24.13 kg/m        Wt Readings from Last 3 Encounters:  04/04/24 140 lb 9.6 oz (63.8 kg)  03/11/24 141 lb 12.8 oz (64.3 kg)  11/26/23 142 lb (64.4 kg)    GEN: Well nourished, well developed in no acute distress NECK: No JVD; No carotid bruits CARDIAC: RRR, no murmurs, rubs, gallops RESPIRATORY:  Clear to  auscultation without rales, wheezing or rhonchi  ABDOMEN: Soft, non-tender, non-distended EXTREMITIES:  No edema; No deformity   ASSESSMENT AND PLAN  Pre-operative evaluation for Inspire implant Chest pain CAD H/o Coronary spasm She is scheduled for Inspire implant tomorrow, but reported episode of chest pain and was brought in for pre-op exam. She was on vacation and was unable to come in sooner. She reports chest pain episode following [place] food and a margarita, however pain was relieved with SL NTG.She walks daily and has no  exertional symptoms.  Prior notes report intermittent anginal episodes, possibly related to indigestion. Suspect GERD, but cannot 100% exclude CAD. EKG today is unchanged. She denies recurrent chest pain. Patient will be under anesthesia for surgery. Last heart cath in 2022 showed nonobstructive CAD. She has been on amlodipine  for h/o suspected vasospasm, however BP continues to decrease with weight loss and we will stop amlodipine  today. I will order Cardiac PET Stress for reassurance. If this is normal, OK for patient to undergo surgery.  METS>4 RCRI=1/1% risk of MACE From a CV perspective OK to hold ASA per surgery recommendations, however may need to consult neurology given h/o ischemic stroke.  HTN  BP is soft today, 100/50. We will stop amlodipine . We will re-check at follow-up.   HLD Lipids well-controlled. Continue Lipitor  20 mg daily.   H/o Ischemic stroke No deficits noted. Continue ASA and statin therapy.   Obesity She has lost 55lbs on Wegovy.         Dispo: follow-up 1 year  Signed, Tilda Samudio VEAR Fishman, PA-C

## 2024-04-04 NOTE — Patient Instructions (Addendum)
 Medication Instructions:  Your physician recommends the following medication changes.  STOP TAKING: Amlodipine    *If you need a refill on your cardiac medications before your next appointment, please call your pharmacy*  Lab Work: No labs ordered today    Testing/Procedures:    Please report to Radiology at the [facility] Main Entrance 30 minutes early for your test.  [address] [place], [place] [address]                         OR   Please report to Radiology at [facility] Main Entrance, medical mall, 30 mins prior to your test.  [address]  [place], [place]  How to Prepare for Your Cardiac PET/CT Stress Test:  Nothing to eat or drink, except water, 3 hours prior to arrival time.  NO caffeine/decaffeinated products, or chocolate 12 hours prior to arrival. (Please note decaffeinated beverages (teas/coffees) still contain caffeine).  If you have caffeine within 12 hours prior, the test will need to be rescheduled.  Medication instructions: Do not take erectile dysfunction medications for 72 hours prior to test (sildenafil, tadalafil) Do not take nitrates (isosorbide  mononitrate, Ranexa ) the day before or day of test Do not take tamsulosin the day before or morning of test Hold theophylline containing medications for 12 hours. Hold Dipyridamole 48 hours prior to the test.  Diabetic Preparation: If able to eat breakfast prior to 3 hour fasting, you may take all medications, including your insulin. Do not worry if you miss your breakfast dose of insulin - start at your next meal. If you do not eat prior to 3 hour fast-Hold all diabetes (oral and insulin) medications. Patients who wear a continuous glucose monitor MUST remove the device prior to scanning.  You may take your remaining medications with water.  NO perfume, cologne or lotion on chest or abdomen area. FEMALES - Please avoid wearing dresses to this  appointment.  Total time is 1 to 2 hours; you may want to bring reading material for the waiting time.  IF YOU THINK YOU MAY BE PREGNANT, OR ARE NURSING PLEASE INFORM THE TECHNOLOGIST.  In preparation for your appointment, medication and supplies will be purchased.  Appointment availability is limited, so if you need to cancel or reschedule, please call the Radiology Department Scheduler at [phone] 24 hours in advance to avoid a cancellation fee of $100.00  What to Expect When you Arrive:  Once you arrive and check in for your appointment, you will be taken to a preparation room within the Radiology Department.  A technologist or Nurse will obtain your medical history, verify that you are correctly prepped for the exam, and explain the procedure.  Afterwards, an IV will be started in your arm and electrodes will be placed on your skin for EKG monitoring during the stress portion of the exam. Then you will be escorted to the PET/CT scanner.  There, staff will get you positioned on the scanner and obtain a blood pressure and EKG.  During the exam, you will continue to be connected to the EKG and blood pressure machines.  A small, safe amount of a radioactive tracer will be injected in your IV to obtain a series of pictures of your heart along with an injection of a stress agent.    After your Exam:  It is recommended that you eat a meal and drink a caffeinated beverage to counter act any effects of  the stress agent.  Drink plenty of fluids for the remainder of the day and urinate frequently for the first couple of hours after the exam.  Your doctor will inform you of your test results within 7-10 business days.  For more information and frequently asked questions, please visit our website: [email or web address]  For questions about your test or how to prepare for your test, please call: Cardiac Imaging Nurse Navigators Office: [phone]   Follow-Up: At [facility], you and your health needs are our priority.  As part of our continuing mission to provide you with exceptional heart care, our providers are all part of one team.  This team includes your primary Cardiologist (physician) and Advanced Practice Providers or APPs (Physician Assistants and Nurse Practitioners) who all work together to provide you with the care you need, when you need it.  Your next appointment:   1 year(s)  Provider:   Gordy Bergamo, MD

## 2024-04-05 DIAGNOSIS — Z01818 Encounter for other preprocedural examination: Secondary | ICD-10-CM

## 2024-04-05 NOTE — Telephone Encounter (Signed)
 New procedure date is 05/17/24.

## 2024-04-08 ENCOUNTER — Encounter (HOSPITAL_COMMUNITY): Payer: Self-pay

## 2024-04-12 ENCOUNTER — Ambulatory Visit
Admission: RE | Admit: 2024-04-12 | Discharge: 2024-04-12 | Disposition: A | Discharge status: Home / Self Care | Source: Ambulatory Visit | Attending: Medical

## 2024-04-12 DIAGNOSIS — R079 Chest pain, unspecified: Secondary | ICD-10-CM | POA: Insufficient documentation

## 2024-04-12 LAB — NM PET CT CARDIAC PERFUSION MULTI W/ABSOLUTE BLOODFLOW
LV dias vol: 66 mL (ref 46–106)
MBFR: 2.65
Nuc Rest EF: 67 %
Nuc Stress EF: 75 %
Peak HR: 103 {beats}/min
Rest HR: 72 {beats}/min
Rest MBF: 1.03 ml/g/min
Rest Nuclear Isotope Dose: 16.5 mCi
SRS: 0
SSS: 2
ST Depression (mm): 0 mm
Stress MBF: 2.73 ml/g/min
Stress Nuclear Isotope Dose: 16.5 mCi
TID: 1.12

## 2024-04-12 MED ORDER — REGADENOSON 0.4 MG/5ML IV SOLN
INTRAVENOUS | Status: AC
  Filled 2024-04-12: qty 5

## 2024-04-12 MED ORDER — REGADENOSON 0.4 MG/5ML IV SOLN
0.4000 mg | Freq: Once | INTRAVENOUS | Status: AC
  Administered 2024-04-12 (×2): 0.4 mg via INTRAVENOUS
  Filled 2024-04-12: qty 5

## 2024-04-12 MED ORDER — RUBIDIUM RB82 GENERATOR (RUBYFILL)
25.0000 | PACK | Freq: Once | INTRAVENOUS | Status: AC
  Administered 2024-04-12 (×2): 16.46 via INTRAVENOUS

## 2024-04-12 NOTE — Progress Notes (Addendum)
 Pt tolerated lexiscan . Stated feeling light squeezing in her chest, similar to [her] second heart attack. Also had minor c/o of a HA during scan. All symptoms resolved at conclusion of scan. PIV removed and PO caffeine given.

## 2024-04-14 ENCOUNTER — Ambulatory Visit: Payer: Self-pay | Admitting: Medical

## 2024-04-16 ENCOUNTER — Observation Stay (HOSPITAL_BASED_OUTPATIENT_CLINIC_OR_DEPARTMENT_OTHER)
Admission: EM | Admit: 2024-04-16 | Discharge: 2024-04-17 | Disposition: A | Discharge status: Home / Self Care | Attending: Family Medicine | Admitting: Family Medicine

## 2024-04-16 ENCOUNTER — Other Ambulatory Visit: Payer: Self-pay

## 2024-04-16 ENCOUNTER — Encounter (HOSPITAL_BASED_OUTPATIENT_CLINIC_OR_DEPARTMENT_OTHER): Payer: Self-pay | Admitting: Urology

## 2024-04-16 DIAGNOSIS — Z8673 Personal history of transient ischemic attack (TIA), and cerebral infarction without residual deficits: Secondary | ICD-10-CM | POA: Diagnosis not present

## 2024-04-16 DIAGNOSIS — I10 Essential (primary) hypertension: Secondary | ICD-10-CM | POA: Insufficient documentation

## 2024-04-16 DIAGNOSIS — E785 Hyperlipidemia, unspecified: Secondary | ICD-10-CM | POA: Diagnosis present

## 2024-04-16 DIAGNOSIS — T7840XA Allergy, unspecified, initial encounter: Secondary | ICD-10-CM

## 2024-04-16 DIAGNOSIS — T63461A Toxic effect of venom of wasps, accidental (unintentional), initial encounter: Secondary | ICD-10-CM | POA: Diagnosis present

## 2024-04-16 DIAGNOSIS — I2510 Atherosclerotic heart disease of native coronary artery without angina pectoris: Secondary | ICD-10-CM | POA: Diagnosis not present

## 2024-04-16 DIAGNOSIS — F419 Anxiety disorder, unspecified: Secondary | ICD-10-CM | POA: Diagnosis not present

## 2024-04-16 DIAGNOSIS — E039 Hypothyroidism, unspecified: Secondary | ICD-10-CM | POA: Diagnosis present

## 2024-04-16 DIAGNOSIS — D72829 Elevated white blood cell count, unspecified: Secondary | ICD-10-CM | POA: Diagnosis not present

## 2024-04-16 DIAGNOSIS — R000 Tachycardia, unspecified: Secondary | ICD-10-CM

## 2024-04-16 DIAGNOSIS — T782XXA Anaphylactic shock, unspecified, initial encounter: Principal | ICD-10-CM | POA: Diagnosis present

## 2024-04-16 LAB — CBC WITH DIFFERENTIAL/PLATELET
Abs Immature Granulocytes: 0.04 K/uL (ref 0.00–0.07)
Basophils Absolute: 0 K/uL (ref 0.0–0.1)
Basophils Relative: 0 %
Eosinophils Absolute: 0 K/uL (ref 0.0–0.5)
Eosinophils Relative: 0 %
HCT: 43.8 % (ref 36.0–46.0)
Hemoglobin: 15.2 g/dL — ABNORMAL HIGH (ref 12.0–15.0)
Immature Granulocytes: 0 %
Lymphocytes Relative: 3 %
Lymphs Abs: 0.4 K/uL — ABNORMAL LOW (ref 0.7–4.0)
MCH: 30.8 pg (ref 26.0–34.0)
MCHC: 34.7 g/dL (ref 30.0–36.0)
MCV: 88.8 fL (ref 80.0–100.0)
Monocytes Absolute: 0.3 K/uL (ref 0.1–1.0)
Monocytes Relative: 2 %
Neutro Abs: 14.4 K/uL — ABNORMAL HIGH (ref 1.7–7.7)
Neutrophils Relative %: 95 %
Platelets: 246 K/uL (ref 150–400)
RBC: 4.93 MIL/uL (ref 3.87–5.11)
RDW: 12.4 % (ref 11.5–15.5)
WBC: 15.2 K/uL — ABNORMAL HIGH (ref 4.0–10.5)
nRBC: 0 % (ref 0.0–0.2)

## 2024-04-16 LAB — COMPREHENSIVE METABOLIC PANEL WITH GFR
ALT: 13 U/L (ref 0–44)
AST: 20 U/L (ref 15–41)
Albumin: 4.1 g/dL (ref 3.5–5.0)
Alkaline Phosphatase: 109 U/L (ref 38–126)
Anion gap: 12 (ref 5–15)
BUN: 18 mg/dL (ref 8–23)
CO2: 23 mmol/L (ref 22–32)
Calcium: 9.3 mg/dL (ref 8.9–10.3)
Chloride: 106 mmol/L (ref 98–111)
Creatinine, Ser: 0.76 mg/dL (ref 0.44–1.00)
GFR, Estimated: >60 mL/min (ref >60)
Glucose, Bld: 157 mg/dL — ABNORMAL HIGH (ref 70–99)
Potassium: 4 mmol/L (ref 3.5–5.1)
Sodium: 140 mmol/L (ref 135–145)
Total Bilirubin: 0.4 mg/dL (ref 0.0–1.2)
Total Protein: 6.6 g/dL (ref 6.5–8.1)

## 2024-04-16 MED ORDER — DEXAMETHASONE SODIUM PHOSPHATE 10 MG/ML IJ SOLN
10.0000 mg | Freq: Once | INTRAMUSCULAR | Status: AC
  Administered 2024-04-16: 10 mg via INTRAVENOUS
  Filled 2024-04-16: qty 1

## 2024-04-16 MED ORDER — MORPHINE SULFATE (PF) 2 MG/ML IV SOLN
1.0000 mg | Freq: Once | INTRAVENOUS | Status: AC
  Administered 2024-04-16: 1 mg via INTRAVENOUS
  Filled 2024-04-16: qty 1

## 2024-04-16 MED ORDER — FAMOTIDINE IN NACL 20-0.9 MG/50ML-% IV SOLN
20.0000 mg | Freq: Once | INTRAVENOUS | Status: AC
  Administered 2024-04-16: 20 mg via INTRAVENOUS
  Filled 2024-04-16: qty 50

## 2024-04-16 MED ORDER — EPINEPHRINE 0.3 MG/0.3ML IJ SOAJ
0.3000 mg | Freq: Once | INTRAMUSCULAR | Status: AC
  Administered 2024-04-16: 0.3 mg via INTRAMUSCULAR
  Filled 2024-04-16: qty 0.3

## 2024-04-16 MED ORDER — SODIUM CHLORIDE 0.9 % IV BOLUS
500.0000 mL | Freq: Once | INTRAVENOUS | Status: AC
  Administered 2024-04-16: 500 mL via INTRAVENOUS

## 2024-04-16 MED ORDER — ONDANSETRON HCL 4 MG/2ML IJ SOLN
4.0000 mg | Freq: Once | INTRAMUSCULAR | Status: AC
  Administered 2024-04-16: 4 mg via INTRAVENOUS
  Filled 2024-04-16: qty 2

## 2024-04-16 MED ORDER — ACETAMINOPHEN 325 MG PO TABS
975.0000 mg | ORAL_TABLET | Freq: Once | ORAL | Status: AC
  Administered 2024-04-16: 975 mg via ORAL
  Filled 2024-04-16: qty 3

## 2024-04-16 NOTE — ED Triage Notes (Signed)
 Wasp bite to back of left leg at approx 1200 Since has hives and states tongue feels thick and is [employer] some benadryl  but then vomited  Unknown if allergic to bees

## 2024-04-16 NOTE — ED Provider Notes (Signed)
 [facility] AT [facility] Provider Note   CSN: [id number] Arrival date & time: 04/16/24  1542     Patient presents with: Insect Bite and Allergic Reaction   Kelly Warner is a 68 y.o. female.   HPI   Patient presents because of concern for bee sting.  Patient was stung by bee about 1 PM.  Shortly thereafter, had episode of emesis.  Also feels like her mouth is swelling.  Also endorsing a rash on her chest, abdomen, back and bilateral arms.  She has no previous anaphylaxis history.  No allergy history that she reports.  Currently denies any shortness of breath but feels nauseous, feels like her throat swelling slightly as well, she feels like she cannot swallow as well as when she usually does.  Took Benadryl  before coming into the ED.  Previous medical history reviewed : Patient follows up with cardiology.  History of coronary spasm.  Cath back in 2022 showed small vessel disease.  Very mild coronary disease.  Recommended medical therapy.  Had NSTEMI in the past in the setting of vasospasm.   Prior to Admission medications   Medication Sig Start Date End Date Taking? Authorizing Provider  aspirin  (ASPIRIN  CHILDRENS) 81 MG chewable tablet Chew 1 tablet (81 mg total) by mouth every other day. 03/11/24   Ladona Heinz, MD  atorvastatin  (LIPITOR ) 20 MG tablet Take 20 mg by mouth every evening. 06/26/21   [provider]  citalopram  (CELEXA ) 10 MG tablet Take 10 mg by mouth daily.    [provider]  dexlansoprazole (DEXILANT) 60 MG capsule Take 60 mg by mouth at bedtime.    [provider]  fexofenadine (ALLEGRA) 180 MG tablet Take 180 mg by mouth as needed.    [provider]  levothyroxine  (SYNTHROID ) 25 MCG tablet Take 25 mcg by mouth daily before breakfast. 03/06/20   [provider]  mometasone (NASONEX) 50 MCG/ACT nasal spray Place 2 sprays into the nose as needed.    [provider]  nitroGLYCERIN   (NITROSTAT ) 0.4 MG SL tablet Place 1 tablet (0.4 mg total) under the tongue every 5 (five) minutes as needed for chest pain. 04/09/23 04/04/24  Ladona Heinz, MD  Polyethyl Glycol-Propyl Glycol (SYSTANE) 0.4-0.3 % SOLN Place 1-2 drops into both eyes 3 (three) times daily as needed (dry/irritated eyes.).    [provider]  Vitamin D, Ergocalciferol, (DRISDOL) 1.25 MG (50000 UNIT) CAPS capsule Take 50,000 Units by mouth once a week. 02/29/24 02/28/25  [provider]  WEGOVY 0.25 MG/0.5ML SOAJ Inject into the skin. 01/27/23   [provider]    Allergies: Isosorbide  nitrate, Oxybutynin, Lisinopril, and Lactose intolerance (gi)    Review of Systems  Constitutional:  Negative for chills and fever.  HENT:  Negative for ear pain and sore throat.   Eyes:  Negative for pain and visual disturbance.  Respiratory:  Negative for cough and shortness of breath.   Cardiovascular:  Negative for chest pain and palpitations.  Gastrointestinal:  Negative for abdominal pain and vomiting.  Genitourinary:  Negative for dysuria and hematuria.  Musculoskeletal:  Negative for arthralgias and back pain.  Skin:  Negative for color change and rash.  Neurological:  Negative for seizures and syncope.  All other systems reviewed and are negative.   Updated Vital Signs BP 117/74   Pulse (!) 114   Temp 98 F (36.7 C)   Resp (!) 22   Ht 5' 4 (1.626 m)   Wt 63.7  kg   SpO2 95%   BMI 24.11 kg/m   Physical Exam Vitals and nursing note reviewed.  Constitutional:      General: She is not in acute distress.    Appearance: She is well-developed.  HENT:     Head: Normocephalic and atraumatic.     Comments: Oral swelling noted  Eyes:     Conjunctiva/sclera: Conjunctivae normal.  Cardiovascular:     Rate and Rhythm: Normal rate and regular rhythm.     Heart sounds: No murmur heard. Pulmonary:     Effort: Pulmonary effort is normal. No respiratory distress.     Breath sounds: Normal breath  sounds.  Abdominal:     Palpations: Abdomen is soft.     Tenderness: There is no abdominal tenderness.  Musculoskeletal:        General: No swelling.     Cervical back: Neck supple.  Skin:    General: Skin is warm and dry.     Capillary Refill: Capillary refill takes less than 2 seconds.     Comments: Urticaria on the chest, abdomen, bilateral arms   Neurological:     Mental Status: She is alert.  Psychiatric:        Mood and Affect: Mood normal.     (all labs ordered are listed, but only abnormal results are displayed) Labs Reviewed  CBC WITH DIFFERENTIAL/PLATELET - Abnormal; Notable for the following components:      Result Value   WBC 15.2 (*)    Hemoglobin 15.2 (*)    Neutro Abs 14.4 (*)    Lymphs Abs 0.4 (*)    All other components within normal limits  COMPREHENSIVE METABOLIC PANEL WITH GFR - Abnormal; Notable for the following components:   Glucose, Bld 157 (*)    All other components within normal limits    EKG: None  Radiology: No results found.   Procedures   Medications Ordered in the ED  sodium chloride  0.9 % bolus 500 mL (0 mLs Intravenous Stopped 04/16/24 1649)  EPINEPHrine  (EPI-PEN) injection 0.3 mg (0.3 mg Intramuscular Given 04/16/24 1604)  dexamethasone  (DECADRON ) injection 10 mg (10 mg Intravenous Given 04/16/24 1600)  famotidine  (PEPCID ) IVPB 20 mg premix (0 mg Intravenous Stopped 04/16/24 1637)  ondansetron  (ZOFRAN ) injection 4 mg (4 mg Intravenous Given 04/16/24 1611)  sodium chloride  0.9 % bolus 500 mL (0 mLs Intravenous Stopped 04/16/24 2121)  acetaminophen  (TYLENOL ) tablet 975 mg (975 mg Oral Given 04/16/24 2148)                                    Medical Decision Making Amount and/or Complexity of Data Reviewed Labs: ordered.  Risk OTC drugs. Prescription drug management. Decision regarding hospitalization.     Patient presents because of concern for bee sting.  Patient was stung by bee about 1 PM.  Shortly thereafter, had episode of  emesis.  Also feels like her mouth is swelling.  Also endorsing a rash on her chest, abdomen, back and bilateral arms.  She has no previous anaphylaxis history.  No allergy history that she reports.  Currently denies any shortness of breath but feels nauseous, feels like her throat swelling slightly as well, she feels like she cannot swallow as well as when she usually does.  Took Benadryl  before coming into the ED.  Previous medical history reviewed : Patient follows up with cardiology.  History of coronary spasm.  Cath back in  2022 showed small vessel disease.  Very mild coronary disease.  Recommended medical therapy.  Had NSTEMI in the past in the setting of vasospasm.    Upon exam, patient hemodynamically stable.  O2 saturation 99% on room air.   Patient meets 2 out of 4 criteria for anaphylaxis given nausea and vomiting, urticaria.  Currently no wheezing or blood pressure issues.  But given that patient has swelling in her mouth, I do think we should proceed with IM epinephrine .  She does have a history of coronary vasospasm.  We will watch closely but I think the benefits outweigh the risk in terms of the coronary spasm with this oral swelling.   After epinephrine  administration, patient symptoms largely improved.  Urticaria improved.  Patient still endorse some ongoing oral swelling.  Interestingly, patient heart rate remained elevated for greater than 5 hours after the epinephrine  administration.  Initially, I was thinking the elevated heart rate was likely related to epinephrine  but we think this would have worn off by now.  Repeat EKG.  Sinus tachycardia.  No chest pain.  No shortness of breath.  Obtain laboratory workup to which was unremarkable.  Does show some leukocytosis which is likely reactive in the setting of steroids and epinephrine .   Do not think there is any indication to give another round of epinephrine  despite the tachycardia.  No hypotension.  Urticaria is improved.  No GI  distress.  She feels like her oral swelling is improving as well.  She also has a history of coronary vasospasms resulting in NSTEMI.   Given the ongoing tachycardia, and minimal oral swelling, patient will be observed overnight to make sure she does not have encountered rebound anaphylaxis and/or her heart rate improves.        Final diagnoses:  Anaphylaxis, initial encounter  Tachycardia    ED Discharge Orders     None          Simon Lavonia SAILOR, MD 04/16/24 2240

## 2024-04-16 NOTE — Progress Notes (Signed)
 Plan of Care Note for accepted transfer   Patient: Kelly Warner MRN: [id number]   DOA: 04/16/2024  Facility requesting transfer: [facility]   Requesting Provider: Dr. Simon   Reason for transfer: Anaphylaxis   Facility course: 68 yr old female with HTN, HLD, non-obstructive CAD, coronary vasospasm, OSA, and hypothyroidism who presents with urticaria, N/V, and sensation of tongue swelling.   She is mildly tachycardic with stable BP.   She was given Epi-Pen, Decadron , Pepcid , Zofran , and 1 liter NS.   Sxs resolved and ED requesting overnight observation.   Plan of care: The patient is accepted for admission to Progressive unit, at [facility].   Author: Evalene GORMAN Sprinkles, MD 04/16/2024  Check [email or web address] for on-call coverage.  Nursing staff, Please call [facility] Admits & Consults System-Wide number on Amion as soon as patient's arrival, so appropriate admitting provider can evaluate the pt.

## 2024-04-17 DIAGNOSIS — I2510 Atherosclerotic heart disease of native coronary artery without angina pectoris: Secondary | ICD-10-CM | POA: Diagnosis present

## 2024-04-17 DIAGNOSIS — T782XXA Anaphylactic shock, unspecified, initial encounter: Secondary | ICD-10-CM

## 2024-04-17 DIAGNOSIS — T63461A Toxic effect of venom of wasps, accidental (unintentional), initial encounter: Secondary | ICD-10-CM

## 2024-04-17 DIAGNOSIS — E039 Hypothyroidism, unspecified: Secondary | ICD-10-CM | POA: Diagnosis present

## 2024-04-17 LAB — BASIC METABOLIC PANEL WITH GFR
Anion gap: 11 (ref 5–15)
BUN: 16 mg/dL (ref 8–23)
CO2: 25 mmol/L (ref 22–32)
Calcium: 9.2 mg/dL (ref 8.9–10.3)
Chloride: 102 mmol/L (ref 98–111)
Creatinine, Ser: 0.65 mg/dL (ref 0.44–1.00)
GFR, Estimated: >60 mL/min (ref >60)
Glucose, Bld: 134 mg/dL — ABNORMAL HIGH (ref 70–99)
Potassium: 4.3 mmol/L (ref 3.5–5.1)
Sodium: 138 mmol/L (ref 135–145)

## 2024-04-17 LAB — CBC
HCT: 43.1 % (ref 36.0–46.0)
Hemoglobin: 14.5 g/dL (ref 12.0–15.0)
MCH: 31 pg (ref 26.0–34.0)
MCHC: 33.6 g/dL (ref 30.0–36.0)
MCV: 92.3 fL (ref 80.0–100.0)
Platelets: 269 K/uL (ref 150–400)
RBC: 4.67 MIL/uL (ref 3.87–5.11)
RDW: 12.5 % (ref 11.5–15.5)
WBC: 12 K/uL — ABNORMAL HIGH (ref 4.0–10.5)
nRBC: 0 % (ref 0.0–0.2)

## 2024-04-17 LAB — HIV ANTIBODY (ROUTINE TESTING W REFLEX): HIV Screen 4th Generation wRfx: NONREACTIVE

## 2024-04-17 MED ORDER — EPINEPHRINE 0.3 MG/0.3ML IJ SOAJ: 0.3000 mg | Freq: Once | INTRAMUSCULAR | Status: DC | PRN

## 2024-04-17 MED ORDER — ONDANSETRON HCL 4 MG PO TABS: 4.0000 mg | ORAL_TABLET | Freq: Four times a day (QID) | ORAL | Status: DC | PRN

## 2024-04-17 MED ORDER — EPINEPHRINE 0.3 MG/0.3ML IJ SOAJ: 0.3000 mg | INTRAMUSCULAR | 3 refills | Status: AC | PRN

## 2024-04-17 MED ORDER — PREDNISONE 20 MG PO TABS: 40.0000 mg | ORAL_TABLET | Freq: Every day | ORAL | 0 refills | Status: DC

## 2024-04-17 MED ORDER — ACETAMINOPHEN 325 MG PO TABS
650.0000 mg | ORAL_TABLET | Freq: Four times a day (QID) | ORAL | Status: DC | PRN
  Administered 2024-04-17: 650 mg via ORAL
  Filled 2024-04-17: qty 2

## 2024-04-17 MED ORDER — PREDNISONE 20 MG PO TABS
40.0000 mg | ORAL_TABLET | Freq: Every day | ORAL | Status: AC
  Administered 2024-04-17: 40 mg via ORAL
  Filled 2024-04-17: qty 2

## 2024-04-17 MED ORDER — ATORVASTATIN CALCIUM 20 MG PO TABS: 20.0000 mg | ORAL_TABLET | Freq: Every evening | ORAL | Status: DC

## 2024-04-17 MED ORDER — ONDANSETRON HCL 4 MG/2ML IJ SOLN: 4.0000 mg | Freq: Four times a day (QID) | INTRAMUSCULAR | Status: DC | PRN

## 2024-04-17 MED ORDER — ACETAMINOPHEN 650 MG RE SUPP: 650.0000 mg | Freq: Four times a day (QID) | RECTAL | Status: DC | PRN

## 2024-04-17 MED ORDER — FAMOTIDINE 20 MG PO TABS: 20.0000 mg | ORAL_TABLET | Freq: Two times a day (BID) | ORAL | 0 refills | Status: DC

## 2024-04-17 MED ORDER — EPINEPHRINE PF 1 MG/ML IJ SOLN: 0.3000 mg | Freq: Once | INTRAMUSCULAR | Status: DC | PRN

## 2024-04-17 MED ORDER — SENNOSIDES-DOCUSATE SODIUM 8.6-50 MG PO TABS: 1.0000 | ORAL_TABLET | Freq: Every evening | ORAL | Status: DC | PRN

## 2024-04-17 MED ORDER — LEVOTHYROXINE SODIUM 25 MCG PO TABS
25.0000 ug | ORAL_TABLET | Freq: Every day | ORAL | Status: DC
  Administered 2024-04-17: 25 ug via ORAL
  Filled 2024-04-17: qty 1

## 2024-04-17 MED ORDER — FAMOTIDINE IN NACL 20-0.9 MG/50ML-% IV SOLN
20.0000 mg | Freq: Two times a day (BID) | INTRAVENOUS | Status: DC
  Administered 2024-04-17: 20 mg via INTRAVENOUS
  Filled 2024-04-17: qty 50

## 2024-04-17 MED ORDER — ASPIRIN 81 MG PO CHEW
81.0000 mg | CHEWABLE_TABLET | ORAL | Status: DC
  Administered 2024-04-17: 81 mg via ORAL
  Filled 2024-04-17: qty 1

## 2024-04-17 MED ORDER — DIPHENHYDRAMINE HCL 50 MG/ML IJ SOLN: 25.0000 mg | Freq: Four times a day (QID) | INTRAMUSCULAR | Status: DC | PRN

## 2024-04-17 MED ORDER — CITALOPRAM HYDROBROMIDE 20 MG PO TABS
20.0000 mg | ORAL_TABLET | Freq: Every day | ORAL | Status: DC
  Administered 2024-04-17: 20 mg via ORAL
  Filled 2024-04-17: qty 1

## 2024-04-17 MED ORDER — ENOXAPARIN SODIUM 40 MG/0.4ML IJ SOSY
40.0000 mg | PREFILLED_SYRINGE | INTRAMUSCULAR | Status: DC
  Administered 2024-04-17: 40 mg via SUBCUTANEOUS
  Filled 2024-04-17: qty 0.4

## 2024-04-17 MED ORDER — SODIUM CHLORIDE 0.9% FLUSH
3.0000 mL | Freq: Two times a day (BID) | INTRAVENOUS | Status: DC
  Administered 2024-04-17: 3 mL via INTRAVENOUS

## 2024-04-17 NOTE — TOC Initial Note (Signed)
 Transition of Care [facility]) - Initial/Assessment Note    Patient Details  Name: Kelly Warner MRN: [id number] Date of Birth: 1955/09/07  Transition of Care [facility]) CM/SW Contact:    Sonda Manuella Quill, RN Phone Number: 04/17/2024, 12:39 PM  Clinical Narrative:                 [name] w/ pt and spouse [name] [phone]) in room; pt says she lives at home; she plans to return at d/c; pt verified insurance/PCP; she denied SDOH risks; pt does not have DME, HH services, or home oxygen; no TOC needs.  Expected Discharge Plan: Home/Self Care Barriers to Discharge: No Barriers Identified   Patient Goals and CMS Choice Patient states their goals for this hospitalization and ongoing recovery are:: home CMS Medicare.gov Compare Post Acute Care list provided to:: Patient        Expected Discharge Plan and Services   Discharge Planning Services: CM Consult   Living arrangements for the past 2 months: Single Family Home Expected Discharge Date: 04/17/24               DME Arranged: N/A DME Agency: NA       HH Arranged: NA HH Agency: NA        Prior Living Arrangements/Services Living arrangements for the past 2 months: Single Family Home Lives with:: Spouse Patient language and need for interpreter reviewed:: Yes Do you feel safe going back to the place where you live?: Yes      Need for Family Participation in Patient Care: Yes (Comment) Care giver support system in place?: Yes (comment) Current home services:  (n/a) Criminal Activity/Legal Involvement Pertinent to Current Situation/Hospitalization: No - Comment as needed  Activities of Daily Living   ADL Screening (condition at time of admission) Independently performs ADLs?: No Does the patient have a NEW difficulty with bathing/dressing/toileting/self-feeding that is expected to last >3 days?: No Does the patient have a NEW difficulty with getting in/out of bed, walking, or climbing stairs that is expected to  last >3 days?: No Does the patient have a NEW difficulty with communication that is expected to last >3 days?: No Is the patient deaf or have difficulty hearing?: Yes Does the patient have difficulty seeing, even when wearing glasses/contacts?: Yes Does the patient have difficulty concentrating, remembering, or making decisions?: No  Permission Sought/Granted Permission sought to share information with : Case Manager Permission granted to share information with : Yes, Verbal Permission Granted  Share Information with NAME: Case Manager     Permission granted to share info w Relationship: Boyd Litaker (spouse) [phone]     Emotional Assessment Appearance:: Appears stated age Attitude/Demeanor/Rapport: Gracious Affect (typically observed): Accepting Orientation: : Oriented to Self, Oriented to Place, Oriented to  Time, Oriented to Situation Alcohol / Substance Use: Not Applicable Psych Involvement: No (comment)  Admission diagnosis:  Allergic reaction [T78.40XA] Tachycardia [R00.0] Anaphylaxis, initial encounter [T78.2XXA] Patient Active Problem List   Diagnosis Date Noted   Hypothyroidism 04/17/2024   Coronary artery disease    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/2024   Pure hypercholesterolemia 10/23/2021   Non-ST elevation (NSTEMI) myocardial infarction [facility])    Unstable angina pectoris (HCC) 08/13/2021   History of stroke 11/16/2017   Acute ischemic stroke (HCC) 03/15/2017   Acute recurrent pansinusitis 11/02/2015   Allergic rhinitis 09/20/2015   Hyperlipidemia 09/20/2015   Laryngopharyngeal reflux (LPR) 09/20/2015   Lentigo 09/20/2015   Migraine headache 09/20/2015   Osteopenia 09/20/2015   Other abnormal  auditory perceptions, unspecified ear 09/20/2015   Other seborrheic keratosis 09/20/2015   Primary hypertension 03/12/2009   Vitamin D deficiency 09/07/2008   PCP:  Debrah Josette ORN., PA-C Pharmacy:   [facility] [id number] - [address], [place] -  [address] [place] [address] Phone: [phone] Fax: [phone]     Social Drivers of Health (SDOH) Social History: SDOH Screenings   Food Insecurity: No Food Insecurity (04/17/2024)  Housing: Low Risk  (04/17/2024)  Transportation Needs: No Transportation Needs (04/17/2024)  Utilities: Not At Risk (04/17/2024)  Depression (PHQ2-9): Low Risk  (12/26/2021)  Financial Resource Strain: Low Risk  (11/11/2022)   Received from [facility]  Social Connections: Socially Integrated (04/16/2024)  Tobacco Use: Low Risk  (04/16/2024)   SDOH Interventions: Food Insecurity Interventions: Intervention Not Indicated, Inpatient TOC Housing Interventions: Intervention Not Indicated, Inpatient TOC Transportation Interventions: Intervention Not Indicated, Inpatient TOC Utilities Interventions: Intervention Not Indicated, Inpatient TOC   Readmission Risk Interventions     No data to display

## 2024-04-17 NOTE — Discharge Summary (Signed)
 Physician Discharge Summary   Patient: Kelly Warner MRN: [id number] DOB: 1955/11/25  Admit date:     04/16/2024  Discharge date: 04/17/24  Discharge Physician: Toribio Door   PCP: Debrah Josette ORN., PA-C   Recommendations at discharge:   Recommend outpatient follow-up with allergist given this was her first reaction to a wasp sting.  May be useful to conduct an evaluation for other allergies.  Discharge Diagnoses: Principal Problem:   Anaphylactic reaction to wasp sting Active Problems:   Hyperlipidemia   History of stroke   Coronary artery disease   Hypothyroidism  Resolved Problems:   * No resolved hospital problems. *  Hospital Course: 68 year old woman presented with allergic reaction after wasp sting resulting in significant erythema arms and legs as well as tongue swelling.  Admitted for anaphylaxis.  Treated with epinephrine , Pepcid , Decadron  with marked clinical improvement.  On discharge, very little rash left and groin and axillas.  No tongue swelling.  Advised to follow-up with allergist.  Discharged on short course of prednisone  and Pepcid , with EpiPen .  Consultants None   Procedures/Events None  Chronic issues remained stable including history of stroke, CAD, hypertension, hyperlipidemia, hypothyroidism and anxiety.  Disposition: Home Diet recommendation:  Discharge Diet Orders (From admission, onward)     Start     Ordered   04/17/24 0000  Diet general        04/17/24 1217           Regular diet DISCHARGE MEDICATION: Allergies as of 04/17/2024       Reactions   Isosorbide  Nitrate Other (See Comments)   Headache, nausea and vomiting.   Oxybutynin Other (See Comments)   Headaches  Headaches   Lisinopril Other (See Comments)   Incontinence  Incontinence   Lactose Intolerance (gi) Other (See Comments)   GI upset        Medication List     TAKE these medications    aspirin  81 MG chewable tablet Commonly known as: Aspirin   Childrens Chew 1 tablet (81 mg total) by mouth every other day.   atorvastatin  20 MG tablet Commonly known as: LIPITOR  Take 20 mg by mouth every evening.   citalopram  10 MG tablet Commonly known as: CELEXA  Take 10 mg by mouth daily.   dexlansoprazole 60 MG capsule Commonly known as: DEXILANT Take 60 mg by mouth at bedtime.   EPINEPHrine  0.3 mg/0.3 mL Soaj injection Commonly known as: EPI-PEN Inject 0.3 mg into the muscle as needed for anaphylaxis.   famotidine  20 MG tablet Commonly known as: PEPCID  Take 1 tablet (20 mg total) by mouth 2 (two) times daily.   fexofenadine 180 MG tablet Commonly known as: ALLEGRA Take 180 mg by mouth as needed.   levothyroxine  25 MCG tablet Commonly known as: SYNTHROID  Take 25 mcg by mouth daily before breakfast.   mometasone 50 MCG/ACT nasal spray Commonly known as: NASONEX Place 2 sprays into the nose as needed.   nitroGLYCERIN  0.4 MG SL tablet Commonly known as: NITROSTAT  Place 1 tablet (0.4 mg total) under the tongue every 5 (five) minutes as needed for chest pain.   predniSONE  20 MG tablet Commonly known as: DELTASONE  Take 2 tablets (40 mg total) by mouth daily with breakfast.   Systane 0.4-0.3 % Soln Generic drug: Polyethyl Glycol-Propyl Glycol Place 1-2 drops into both eyes 3 (three) times daily as needed (dry/irritated eyes.).   Vitamin D (Ergocalciferol) 1.25 MG (50000 UNIT) Caps capsule Commonly known as: DRISDOL Take 50,000 Units by mouth once a week.  Wegovy 0.25 MG/0.5ML Soaj SQ injection Generic drug: semaglutide-weight management Inject into the skin.        Follow-up Information     Debrah Josette ORN., PA-C Follow up.   Specialty: Family Medicine Why: As needed Contact information: [address] [place] [address] [phone]                Feels better Hives almost gone Tongue edema resolved  Discharge Exam: Filed Weights   04/16/24 1547 04/16/24 2348  Weight: 63.7 kg 63.7 kg    Physical Exam Vitals reviewed.  Constitutional:      General: She is not in acute distress.    Appearance: She is not ill-appearing or toxic-appearing.  Cardiovascular:     Rate and Rhythm: Normal rate and regular rhythm.     Heart sounds: No murmur heard. Pulmonary:     Effort: Pulmonary effort is normal. No respiratory distress.     Breath sounds: No wheezing, rhonchi or rales.  Skin:    Comments: Very little rash residual when bilateral axilla and groin.  Neurological:     Mental Status: She is alert.  Psychiatric:        Mood and Affect: Mood normal.        Behavior: Behavior normal.      Condition at discharge: good  The results of significant diagnostics from this hospitalization (including imaging, microbiology, ancillary and laboratory) are listed below for reference.   Imaging Studies: NM PET CT CARDIAC PERFUSION MULTI W/ABSOLUTE BLOODFLOW Result Date: 04/12/2024   LV perfusion is normal. There is no evidence of ischemia. There is no evidence of infarction.   Rest left ventricular function is normal. Rest EF: 67%. Stress left ventricular function is normal. Stress EF: 75%. End diastolic cavity size is normal.   Myocardial blood flow was computed to be 1.22ml/g/min at rest and 2.61ml/g/min at stress. Global myocardial blood flow reserve was 2.65 and was normal.   Coronary calcium  was absent on the attenuation correction CT images.   The study is normal. The study is low risk. Electronically signed by Darryle Decent, MD ___________________________________________________________________________ ________________________________________________________________ ERASMO: OVER-READ INTERPRETATION  CT CHEST The following report is a limited chest CT over-read performed by radiologist Dr. Elsie Ko [facility] on 04/12/2024. This over-read does not include interpretation of cardiac or coronary anatomy or pathology nor does it include evaluation of the PET data. The  cardiac PET-CT interpretation by the cardiologist is attached. COMPARISON:  Chest radiographs 08/13/2021.  Neck CTA 03/15/2017. FINDINGS: Mediastinum/Nodes: No enlarged lymph nodes within the visualized mediastinum. Lungs/Pleura: There is no pleural effusion. The visualized lungs appear clear. Upper abdomen: No significant findings in the visualized upper abdomen. Musculoskeletal/Chest wall: No chest wall mass or suspicious osseous findings within the visualized chest. IMPRESSION: No significant extracardiac findings within the visualized chest. Electronically Signed   By: Elsie Perone M.D.   On: 04/12/2024 14:16   Microbiology: Results for orders placed or performed during the hospital encounter of 08/13/21  Resp Panel by RT-PCR (Flu A&B, Covid) Nasopharyngeal Swab     Status: None   Collection Time: 08/14/21  1:29 AM   Specimen: Nasopharyngeal Swab; Nasopharyngeal(NP) swabs in vial transport medium  Result Value Ref Range Status   SARS Coronavirus 2 by RT PCR NEGATIVE NEGATIVE Final    Comment: (NOTE) SARS-CoV-2 target nucleic acids are NOT DETECTED.  The SARS-CoV-2 RNA is generally detectable in upper respiratory specimens during the acute phase of infection. The lowest concentration of SARS-CoV-2  viral copies this assay can detect is 138 copies/mL. A negative result does not preclude SARS-Cov-2 infection and should not be used as the sole basis for treatment or other patient management decisions. A negative result may occur with  improper specimen collection/handling, submission of specimen other than nasopharyngeal swab, presence of viral mutation(s) within the areas targeted by this assay, and inadequate number of viral copies(<138 copies/mL). A negative result must be combined with clinical observations, patient history, and epidemiological information. The expected result is Negative.  Fact Sheet for Patients:  BloggerCourse.com  Fact Sheet for  Healthcare Providers:  SeriousBroker.it  This test is no t yet approved or cleared by the [place]  FDA and  has been authorized for detection and/or diagnosis of SARS-CoV-2 by FDA under an Emergency Use Authorization (EUA). This EUA will remain  in effect (meaning this test can be used) for the duration of the COVID-19 declaration under Section 564(b)(1) of the Act, 21 U.S.C.section 360bbb-3(b)(1), unless the authorization is terminated  or revoked sooner.       Influenza A by PCR NEGATIVE NEGATIVE Final   Influenza B by PCR NEGATIVE NEGATIVE Final    Comment: (NOTE) The Xpert Xpress SARS-CoV-2/FLU/RSV plus assay is intended as an aid in the diagnosis of influenza from Nasopharyngeal swab specimens and should not be used as a sole basis for treatment. Nasal washings and aspirates are unacceptable for Xpert Xpress SARS-CoV-2/FLU/RSV testing.  Fact Sheet for Patients: BloggerCourse.com  Fact Sheet for Healthcare Providers: SeriousBroker.it  This test is not yet approved or cleared by the [place]  FDA and has been authorized for detection and/or diagnosis of SARS-CoV-2 by FDA under an Emergency Use Authorization (EUA). This EUA will remain in effect (meaning this test can be used) for the duration of the COVID-19 declaration under Section 564(b)(1) of the Act, 21 U.S.C. section 360bbb-3(b)(1), unless the authorization is terminated or revoked.  Performed at [facility], [address], [place], [place] [address]     Labs: CBC: Recent Labs  Lab 04/16/24 2150 04/17/24 0503  WBC 15.2* 12.0*  NEUTROABS 14.4*  --   HGB 15.2* 14.5  HCT 43.8 43.1  MCV 88.8 92.3  PLT 246 269   Basic Metabolic Panel: Recent Labs  Lab 04/16/24 2150 04/17/24 0503  NA 140 138  K 4.0 4.3  CL 106 102  CO2 23 25  GLUCOSE 157* 134*  BUN 18 16  CREATININE 0.76 0.65  CALCIUM  9.3 9.2   Liver  Function Tests: Recent Labs  Lab 04/16/24 2150  AST 20  ALT 13  ALKPHOS 109  BILITOT 0.4  PROT 6.6  ALBUMIN 4.1   CBG: No results for input(s): GLUCAP in the last 168 hours.  Discharge time spent: greater than 30 minutes.  Signed: Toribio Door, MD [facility] 04/17/2024

## 2024-04-17 NOTE — Hospital Course (Addendum)
 68 year old woman presented with allergic reaction after wasp sting resulting in significant erythema arms and legs as well as tongue swelling.  Admitted for anaphylaxis.  Treated with epinephrine , Pepcid , Decadron  with marked clinical improvement.  On discharge, very little rash left and groin and axillas.  No tongue swelling.  Advised to follow-up with allergist.  Discharged on short course of prednisone  and Pepcid , with EpiPen .  Consultants None   Procedures/Events None

## 2024-04-17 NOTE — H&P (Signed)
 History and Physical    [name] FMW:[id number] DOB: 1956/07/11 DOA: 04/16/2024  PCP: Debrah Josette ORN., PA-C  Patient coming from: Home  I have personally briefly reviewed patient's old medical records in [facility]  Chief Complaint: Allergic reaction after wasp sting  HPI: Kelly Warner is a 68 y.o. female with medical history significant for nonobstructive CAD, coronary vasospasm, history of CVA, HTN, hypothyroidism, GERD, OSA who presented to the ED for evaluation of an allergic reaction after a wasp sting.  Patient states that she was stung on the back of her left upper leg by a wasp on 8/16 around 1 PM.  Within the next hour she developed urticarial rash on her chest, bilateral upper strenuously, abdomen, back, and groin area.  She also felt like she had swelling on the inner part of her lips as well as tongue.  She says she did not feel like she had any trouble controlling her oral secretions or shortness of breath.  She tried taking Benadryl  but had not episode of nausea and emesis afterwards.  She denies any similar episodes in the past.  Patient otherwise denies chest pain, dyspnea, fevers, chills, diaphoresis.  She says she was recently taken off her antihypertensive medications, otherwise no other new meds.  [facility] ED Course  Labs/Imaging on admission: I have personally reviewed following labs and imaging studies.  Initial vitals showed BP 169/87, pulse 83, RR 20, temp 98.0 F, SpO2 99% on room air.  Labs showed sodium 140, potassium 4.0, bicarb 23, BUN 18, creatinine 0.76, serum glucose 157, LFTs within normal limits, WBC 15.2, hemoglobin 15.2, platelets 246.  Patient was given IM EpiPen , 1 L normal saline, IV Decadron  10 mg, IV Pepcid  20 mg, IV morphine  1 mg.  Per EDP documentation urticaria significantly improved after administration of IM epi.  She still had continued but improved oral swelling as well as ongoing tachycardia therefore  admission for overnight observation was requested.  The hospitalist service was consulted to admit.  Review of Systems: All systems reviewed and are negative except as documented in history of present illness above.   Past Medical History:  Diagnosis Date   Anxiety    Arthritis    hands   Coronary artery disease    GERD (gastroesophageal reflux disease)    Hypertension    Hypothyroidism    Migraines    NSTEMI (non-ST elevation myocardial infarction) (HCC)    Sleep apnea    no CPAP   Stroke (HCC) 2018   no deficits    Past Surgical History:  Procedure Laterality Date   ABDOMINAL HYSTERECTOMY     CARDIAC CATHETERIZATION     COLONOSCOPY WITH PROPOFOL  N/A 04/19/2020   Procedure: COLONOSCOPY WITH PROPOFOL ;  Surgeon: Kristie Lamprey, MD;  Location: [facility] ENDOSCOPY;  Service: Endoscopy;  Laterality: N/A;   DRUG INDUCED ENDOSCOPY Bilateral 11/26/2023   Procedure: DRUG INDUCED SLEEP ENDOSCOPY;  Surgeon: Carlie Clark, MD;  Location: [facility] ENDOSCOPY;  Service: ENT;  Laterality: Bilateral;   LEFT HEART CATH AND CORONARY ANGIOGRAPHY N/A 08/14/2021   Procedure: LEFT HEART CATH AND CORONARY ANGIOGRAPHY;  Surgeon: Elmira Newman PARAS, MD;  Location: [facility] INVASIVE CV LAB;  Service: Cardiovascular;  Laterality: N/A;   NASAL FRACTURE SURGERY     POLYPECTOMY  04/19/2020   Procedure: POLYPECTOMY;  Surgeon: Kristie Lamprey, MD;  Location: [facility] ENDOSCOPY;  Service: Endoscopy;;   SHOULDER ARTHROSCOPY Right    TONSILLECTOMY      Social History: Social History  Tobacco Use   Smoking status: Never   Smokeless tobacco: Never  Vaping Use   Vaping status: Never Used  Substance Use Topics   Alcohol use: Not Currently   Drug use: No   Allergies  Allergen Reactions   Isosorbide  Nitrate Other (See Comments)    Headache, nausea and vomiting.   Oxybutynin Other (See Comments)    Headaches   Headaches   Lisinopril Other (See Comments)    Incontinence   Incontinence   Lactose Intolerance (Gi) Other (See  Comments)    GI upset    Family History  Problem Relation Age of Onset   Atrial fibrillation Mother 56   Hypertension Mother    Heart attack Father 105   Atrial fibrillation Sister 67   Atrial fibrillation Brother 50   Hypertension Brother      Prior to Admission medications   Medication Sig Start Date End Date Taking? Authorizing Provider  aspirin  (ASPIRIN  CHILDRENS) 81 MG chewable tablet Chew 1 tablet (81 mg total) by mouth every other day. 03/11/24   Ladona Heinz, MD  atorvastatin  (LIPITOR ) 20 MG tablet Take 20 mg by mouth every evening. 06/26/21   [provider]  citalopram  (CELEXA ) 10 MG tablet Take 10 mg by mouth daily.    [provider]  dexlansoprazole (DEXILANT) 60 MG capsule Take 60 mg by mouth at bedtime.    [provider]  fexofenadine (ALLEGRA) 180 MG tablet Take 180 mg by mouth as needed.    [provider]  levothyroxine  (SYNTHROID ) 25 MCG tablet Take 25 mcg by mouth daily before breakfast. 03/06/20   [provider]  mometasone (NASONEX) 50 MCG/ACT nasal spray Place 2 sprays into the nose as needed.    [provider]  nitroGLYCERIN  (NITROSTAT ) 0.4 MG SL tablet Place 1 tablet (0.4 mg total) under the tongue every 5 (five) minutes as needed for chest pain. 04/09/23 04/04/24  Ladona Heinz, MD  Polyethyl Glycol-Propyl Glycol (SYSTANE) 0.4-0.3 % SOLN Place 1-2 drops into both eyes 3 (three) times daily as needed (dry/irritated eyes.).    [provider]  Vitamin D, Ergocalciferol, (DRISDOL) 1.25 MG (50000 UNIT) CAPS capsule Take 50,000 Units by mouth once a week. 02/29/24 02/28/25  [provider]  WEGOVY 0.25 MG/0.5ML SOAJ Inject into the skin. 01/27/23   [provider]    Physical Exam: Vitals:   04/16/24 2030 04/16/24 2100 04/16/24 2300 04/16/24 2348  BP: (!) 102/54 117/74 128/70 134/71  Pulse: (!) 108 (!) 114 (!) 108 (!) 101  Resp: (!) 21 (!) 22 18 20   Temp:    98.5 F (36.9 C)  TempSrc:     Oral  SpO2: 90% 95% 93% 95%  Weight:    63.7 kg  Height:    5' 4 (1.626 m)   Constitutional: Resting in bed, NAD, calm, comfortable Eyes: EOMI, lids and conjunctivae normal ENMT: Mucous membranes are moist. Posterior pharynx clear of any exudate or lesions.Normal dentition.  No appreciable swelling of lips or tongue.  Controlling secretions and speaking in full clear sentences. Neck: normal, supple, no masses. Respiratory: clear to auscultation bilaterally, no wheezing, no crackles, no stridor. Normal respiratory effort. No accessory muscle use.  Cardiovascular: Regular rate and rhythm, no murmurs / rubs / gallops. No extremity edema. 2+ pedal pulses. Abdomen: no tenderness, no masses palpated. Musculoskeletal: no clubbing / cyanosis. No joint deformity upper and lower extremities. Good ROM, no contractures. Normal muscle tone.  Skin: Circular erythematous target type rash to posterior  left thigh with visible puncture site from wasp sting as pictured below. Neurologic: Sensation intact. Strength 5/5 in all 4.  Psychiatric: Normal judgment and insight. Alert and oriented x 3. Normal mood.    EKG: Personally reviewed. Sinus tachycardia, rate 112, PACs, LAFB.  Rate is faster when compared to previous.  Assessment/Plan Principal Problem:   Anaphylactic reaction to wasp sting Active Problems:   Hyperlipidemia   History of stroke   Coronary artery disease   Hypothyroidism   SEVANA GRANDINETTI is a 68 y.o. female with medical history significant for nonobstructive CAD, coronary vasospasm, history of CVA, HTN, hypothyroidism, GERD, OSA who is admitted with an anaphylactic reaction to a wasp sting.  Assessment and Plan: Anaphylactic reaction to wasp sting: Presented to the ED with urticaria, tongue and lip swelling after she was stung by a wasp.  Received IM epinephrine  with significant improvement.  Anaphylactic reaction appears to have nearly resolved. - Will continue IV Pepcid  q12h for  now - IV Benadryl  as needed - Continue close monitoring of airway - EpiPen  available if needed; she will need prescription on discharge  Leukocytosis: This is likely in response to anaphylactic process.  Nonobstructive CAD/history of coronary vasospasm: Stable.  She had recent normal Lexiscan  stress test.  Continue aspirin  and statin.  History of CVA: Continue aspirin  and atorvastatin .  Hypertension: Patient was recently taken off her antihypertensives due to low normal blood pressure.  Hyperlipidemia: Continue atorvastatin .  Hypothyroidism: Synthroid .  Anxiety: Continue Celexa .   DVT prophylaxis: enoxaparin  (LOVENOX ) injection 40 mg Start: 04/17/24 1000 Code Status:   Code Status: Do not attempt resuscitation (DNR) PRE-ARREST INTERVENTIONS DESIRED discussed and confirmed with patient on admission. Family Communication: Discussed with patient, she has discussed with family Disposition Plan: From home and likely discharge to home pending clinical progress Consults called: None Severity of Illness: The appropriate patient status for this patient is OBSERVATION. Observation status is judged to be reasonable and necessary in order to provide the required intensity of service to ensure the patient's safety. The patient's presenting symptoms, physical exam findings, and initial radiographic and laboratory data in the context of their medical condition is felt to place them at decreased risk for further clinical deterioration. Furthermore, it is anticipated that the patient will be medically stable for discharge from the hospital within 2 midnights of admission.   Jorie Blanch MD [facility]  If 7PM-7AM, please contact night-coverage [email or web address]  04/17/2024, 12:38 AM

## 2024-04-17 NOTE — Progress Notes (Incomplete)
  Progress Note   Patient: Kelly Warner FMW:[id number] DOB: [birth year] DOA: 04/16/2024     0 DOS: the patient was seen and examined on 04/17/2024   Brief hospital course: Kelly Warner is a 68 y.o. female with medical history significant for nonobstructive CAD, coronary vasospasm, history of CVA, HTN, hypothyroidism, GERD, OSA who is admitted with an anaphylactic reaction to a wasp sting.  Assessment and Plan: Anaphylactic reaction to wasp sting: Presented to the ED with urticaria, tongue and lip swelling after she was stung by a wasp.  Received IM epinephrine  with significant improvement.  Anaphylactic reaction appears to have nearly resolved. - Will continue IV Pepcid  q12h for now - IV Benadryl  as needed - Continue close monitoring of airway - EpiPen  available if needed; she will need prescription on discharge   Leukocytosis: This is likely in response to anaphylactic process.   Nonobstructive CAD/history of coronary vasospasm: Stable.  She had recent normal Lexiscan  stress test.  Continue aspirin  and statin.   History of CVA: Continue aspirin  and atorvastatin .   Hypertension: Patient was recently taken off her antihypertensives due to low normal blood pressure.   Hyperlipidemia: Continue atorvastatin .   Hypothyroidism: Synthroid .   Anxiety: Continue Celexa .   {Tip this will not be part of the note when signed Body mass index is 24.11 kg/m. , ,  (Optional):26781}  Subjective: ***  Physical Exam: Vitals:   04/16/24 2300 04/16/24 2348 04/17/24 0414 04/17/24 0805  BP: 128/70 134/71 122/63 125/66  Pulse: (!) 108 (!) 101 89 88  Resp: 18 20 15    Temp:  98.5 F (36.9 C) 98 F (36.7 C) 98.1 F (36.7 C)  TempSrc:  Oral Oral Oral  SpO2: 93% 95% 93% 95%  Weight:  63.7 kg    Height:  5' 4 (1.626 m)     Physical Exam  Data Reviewed: {Tip this will not be part of the note when signed- Document your independent interpretation of telemetry tracing, EKG, lab,  Radiology test or any other diagnostic tests. Add any new diagnostic test ordered today.  Family Communication: ***  Disposition: Status is: Observation {Observation:23811}  {Tip this will not be part of the note when signed  DVT Prophylaxis  ., Enoxaparin  (lovenox ) injection 40 mg  (Optional):26781}   Time spent: *** minutes  Author: Toribio Door, MD 04/17/2024 8:25 AM  For on call review [email or web address].

## 2024-04-17 NOTE — Care Management Obs Status (Signed)
 MEDICARE OBSERVATION STATUS NOTIFICATION   Patient Details  Name: Kelly Warner MRN: [id number] Date of Birth: 1955-11-08   Medicare Observation Status Notification Given:  Yes    Sonda Manuella Quill, RN 04/17/2024, 12:30 PM

## 2024-05-11 ENCOUNTER — Other Ambulatory Visit: Payer: Self-pay

## 2024-05-11 ENCOUNTER — Encounter (HOSPITAL_BASED_OUTPATIENT_CLINIC_OR_DEPARTMENT_OTHER): Payer: Self-pay | Admitting: Otolaryngology

## 2024-05-17 ENCOUNTER — Ambulatory Visit (HOSPITAL_COMMUNITY)

## 2024-05-17 ENCOUNTER — Encounter (HOSPITAL_BASED_OUTPATIENT_CLINIC_OR_DEPARTMENT_OTHER): Payer: Self-pay | Admitting: Otolaryngology

## 2024-05-17 ENCOUNTER — Other Ambulatory Visit: Payer: Self-pay

## 2024-05-17 ENCOUNTER — Encounter (HOSPITAL_BASED_OUTPATIENT_CLINIC_OR_DEPARTMENT_OTHER)
Admission: RE | Disposition: A | Discharge status: Home / Self Care | Payer: Self-pay | Source: Home / Self Care | Attending: Otolaryngology

## 2024-05-17 ENCOUNTER — Ambulatory Visit (HOSPITAL_BASED_OUTPATIENT_CLINIC_OR_DEPARTMENT_OTHER)
Admission: RE | Admit: 2024-05-17 | Discharge: 2024-05-17 | Disposition: A | Discharge status: Home / Self Care | Attending: Otolaryngology | Admitting: Otolaryngology

## 2024-05-17 ENCOUNTER — Encounter (HOSPITAL_BASED_OUTPATIENT_CLINIC_OR_DEPARTMENT_OTHER): Payer: Self-pay | Admitting: Anesthesiology

## 2024-05-17 ENCOUNTER — Ambulatory Visit (HOSPITAL_BASED_OUTPATIENT_CLINIC_OR_DEPARTMENT_OTHER): Payer: Self-pay | Admitting: Anesthesiology

## 2024-05-17 DIAGNOSIS — K219 Gastro-esophageal reflux disease without esophagitis: Secondary | ICD-10-CM | POA: Diagnosis not present

## 2024-05-17 DIAGNOSIS — I10 Essential (primary) hypertension: Secondary | ICD-10-CM | POA: Diagnosis not present

## 2024-05-17 DIAGNOSIS — E039 Hypothyroidism, unspecified: Secondary | ICD-10-CM | POA: Insufficient documentation

## 2024-05-17 DIAGNOSIS — F419 Anxiety disorder, unspecified: Secondary | ICD-10-CM | POA: Diagnosis not present

## 2024-05-17 DIAGNOSIS — G4730 Sleep apnea, unspecified: Secondary | ICD-10-CM | POA: Diagnosis not present

## 2024-05-17 DIAGNOSIS — G4733 Obstructive sleep apnea (adult) (pediatric): Secondary | ICD-10-CM

## 2024-05-17 DIAGNOSIS — I25110 Atherosclerotic heart disease of native coronary artery with unstable angina pectoris: Secondary | ICD-10-CM

## 2024-05-17 DIAGNOSIS — I25119 Atherosclerotic heart disease of native coronary artery with unspecified angina pectoris: Secondary | ICD-10-CM | POA: Insufficient documentation

## 2024-05-17 DIAGNOSIS — Z79899 Other long term (current) drug therapy: Secondary | ICD-10-CM | POA: Diagnosis not present

## 2024-05-17 DIAGNOSIS — Z8673 Personal history of transient ischemic attack (TIA), and cerebral infarction without residual deficits: Secondary | ICD-10-CM | POA: Diagnosis not present

## 2024-05-17 DIAGNOSIS — I252 Old myocardial infarction: Secondary | ICD-10-CM | POA: Insufficient documentation

## 2024-05-17 DIAGNOSIS — M1990 Unspecified osteoarthritis, unspecified site: Secondary | ICD-10-CM | POA: Insufficient documentation

## 2024-05-17 DIAGNOSIS — Z01818 Encounter for other preprocedural examination: Secondary | ICD-10-CM

## 2024-05-17 HISTORY — DX: Sleep apnea, unspecified: G47.30

## 2024-05-17 HISTORY — DX: Hypothyroidism, unspecified: E03.9

## 2024-05-17 HISTORY — DX: Anxiety disorder, unspecified: F41.9

## 2024-05-17 HISTORY — PX: IMPLANTATION OF HYPOGLOSSAL NERVE STIMULATOR: SHX6827

## 2024-05-17 HISTORY — DX: Unspecified osteoarthritis, unspecified site: M19.90

## 2024-05-17 SURGERY — INSERTION, HYPOGLOSSAL NERVE STIMULATOR
Anesthesia: General | Site: Chest | Laterality: Right

## 2024-05-17 MED ORDER — ACETAMINOPHEN 500 MG PO TABS
1000.0000 mg | ORAL_TABLET | Freq: Once | ORAL | Status: AC
  Administered 2024-05-17: 1000 mg via ORAL

## 2024-05-17 MED ORDER — MEPERIDINE HCL 25 MG/ML IJ SOLN: 6.2500 mg | INTRAMUSCULAR | Status: DC | PRN

## 2024-05-17 MED ORDER — OXYCODONE HCL 5 MG/5ML PO SOLN: 5.0000 mg | Freq: Once | ORAL | Status: AC | PRN

## 2024-05-17 MED ORDER — CEFAZOLIN SODIUM-DEXTROSE 2-4 GM/100ML-% IV SOLN
INTRAVENOUS | Status: AC
Start: 2024-05-17 — End: 2024-05-17
  Filled 2024-05-17: qty 100

## 2024-05-17 MED ORDER — MIDAZOLAM HCL 2 MG/2ML IJ SOLN
INTRAMUSCULAR | Status: AC
  Filled 2024-05-17: qty 2

## 2024-05-17 MED ORDER — SUCCINYLCHOLINE CHLORIDE 200 MG/10ML IV SOSY
PREFILLED_SYRINGE | INTRAVENOUS | Status: AC
  Filled 2024-05-17: qty 10

## 2024-05-17 MED ORDER — PROPOFOL 500 MG/50ML IV EMUL
INTRAVENOUS | Status: DC | PRN
  Administered 2024-05-17: 50 ug/kg/min via INTRAVENOUS

## 2024-05-17 MED ORDER — LIDOCAINE-EPINEPHRINE 1 %-1:100000 IJ SOLN
INTRAMUSCULAR | Status: DC | PRN
  Administered 2024-05-17: 6 mL

## 2024-05-17 MED ORDER — FENTANYL CITRATE (PF) 100 MCG/2ML IJ SOLN
INTRAMUSCULAR | Status: AC
  Filled 2024-05-17: qty 2

## 2024-05-17 MED ORDER — LIDOCAINE 2% (20 MG/ML) 5 ML SYRINGE
INTRAMUSCULAR | Status: AC
  Filled 2024-05-17: qty 5

## 2024-05-17 MED ORDER — PROPOFOL 10 MG/ML IV BOLUS
INTRAVENOUS | Status: DC | PRN
  Administered 2024-05-17: 150 mg via INTRAVENOUS

## 2024-05-17 MED ORDER — ONDANSETRON HCL 4 MG/2ML IJ SOLN
INTRAMUSCULAR | Status: DC | PRN
  Administered 2024-05-17: 4 mg via INTRAVENOUS

## 2024-05-17 MED ORDER — HYDROCODONE-ACETAMINOPHEN 5-325 MG PO TABS: 1.0000 | ORAL_TABLET | Freq: Four times a day (QID) | ORAL | 0 refills | Status: AC | PRN

## 2024-05-17 MED ORDER — PROPOFOL 10 MG/ML IV BOLUS
INTRAVENOUS | Status: AC
Start: 2024-05-17 — End: 2024-05-17
  Filled 2024-05-17: qty 20

## 2024-05-17 MED ORDER — FENTANYL CITRATE (PF) 100 MCG/2ML IJ SOLN: 25.0000 ug | INTRAMUSCULAR | Status: DC | PRN

## 2024-05-17 MED ORDER — FENTANYL CITRATE (PF) 100 MCG/2ML IJ SOLN
INTRAMUSCULAR | Status: DC | PRN
  Administered 2024-05-17 (×2): 50 ug via INTRAVENOUS

## 2024-05-17 MED ORDER — CEFAZOLIN SODIUM-DEXTROSE 2-4 GM/100ML-% IV SOLN: 2.0000 g | INTRAVENOUS | Status: DC

## 2024-05-17 MED ORDER — ONDANSETRON HCL 4 MG/2ML IJ SOLN: 4.0000 mg | Freq: Once | INTRAMUSCULAR | Status: DC | PRN

## 2024-05-17 MED ORDER — DEXAMETHASONE SODIUM PHOSPHATE 10 MG/ML IJ SOLN
INTRAMUSCULAR | Status: AC
  Filled 2024-05-17: qty 1

## 2024-05-17 MED ORDER — DEXAMETHASONE SODIUM PHOSPHATE 4 MG/ML IJ SOLN
INTRAMUSCULAR | Status: DC | PRN
  Administered 2024-05-17: 8 mg via INTRAVENOUS

## 2024-05-17 MED ORDER — OXYCODONE HCL 5 MG PO TABS
ORAL_TABLET | ORAL | Status: AC
  Filled 2024-05-17: qty 1

## 2024-05-17 MED ORDER — PHENYLEPHRINE HCL-NACL 20-0.9 MG/250ML-% IV SOLN
INTRAVENOUS | Status: DC | PRN
  Administered 2024-05-17: 40 ug/min via INTRAVENOUS

## 2024-05-17 MED ORDER — SUCCINYLCHOLINE CHLORIDE 200 MG/10ML IV SOSY
PREFILLED_SYRINGE | INTRAVENOUS | Status: DC | PRN
  Administered 2024-05-17: 120 mg via INTRAVENOUS

## 2024-05-17 MED ORDER — OXYCODONE HCL 5 MG PO TABS
5.0000 mg | ORAL_TABLET | Freq: Once | ORAL | Status: AC | PRN
  Administered 2024-05-17: 5 mg via ORAL

## 2024-05-17 MED ORDER — LACTATED RINGERS IV SOLN: INTRAVENOUS | Status: DC

## 2024-05-17 MED ORDER — ONDANSETRON HCL 4 MG/2ML IJ SOLN
INTRAMUSCULAR | Status: AC
  Filled 2024-05-17: qty 2

## 2024-05-17 MED ORDER — 0.9 % SODIUM CHLORIDE (POUR BTL) OPTIME
TOPICAL | Status: DC | PRN
  Administered 2024-05-17: 1000 mL

## 2024-05-17 MED ORDER — MIDAZOLAM HCL 5 MG/5ML IJ SOLN
INTRAMUSCULAR | Status: DC | PRN
  Administered 2024-05-17 (×2): 1 mg via INTRAVENOUS

## 2024-05-17 MED ORDER — ACETAMINOPHEN 500 MG PO TABS: 1000.0000 mg | ORAL_TABLET | Freq: Once | ORAL | Status: DC

## 2024-05-17 MED ORDER — ACETAMINOPHEN 500 MG PO TABS
ORAL_TABLET | ORAL | Status: AC
  Filled 2024-05-17: qty 2

## 2024-05-17 MED ORDER — LIDOCAINE HCL (CARDIAC) PF 100 MG/5ML IV SOSY
PREFILLED_SYRINGE | INTRAVENOUS | Status: DC | PRN
  Administered 2024-05-17: 40 mg via INTRAVENOUS

## 2024-05-17 SURGICAL SUPPLY — 59 items
BLADE CLIPPER SURG (BLADE) IMPLANT
BLADE SURG 15 STRL LF DISP TIS (BLADE) ×1 IMPLANT
CANISTER SUCT 1200ML W/VALVE (MISCELLANEOUS) ×1 IMPLANT
CORD BIPOLAR FORCEPS 12FT (ELECTRODE) ×1 IMPLANT
COVER PROBE W GEL 5X96 (DRAPES) IMPLANT
DERMABOND ADVANCED .7 DNX12 (GAUZE/BANDAGES/DRESSINGS) ×2 IMPLANT
DRAPE C-ARM 35X43 STRL (DRAPES) ×1 IMPLANT
DRAPE HEAD BAR (DRAPES) IMPLANT
DRAPE INCISE IOBAN 66X45 STRL (DRAPES) ×1 IMPLANT
DRAPE MICROSCOPE WILD 40.5X102 (DRAPES) ×1 IMPLANT
DRAPE UTILITY XL STRL (DRAPES) ×1 IMPLANT
DRSG TEGADERM 2-3/8X2-3/4 SM (GAUZE/BANDAGES/DRESSINGS) ×2 IMPLANT
DRSG TEGADERM 4X4.75 (GAUZE/BANDAGES/DRESSINGS) IMPLANT
ELECT COATED BLADE 2.86 ST (ELECTRODE) ×1 IMPLANT
ELECTRODE EMG 18 NIMS (NEUROSURGERY SUPPLIES) ×1 IMPLANT
ELECTRODE REM PT RTRN 9FT ADLT (ELECTROSURGICAL) ×1 IMPLANT
FORCEPS BIPOLAR SPETZLER 8 1.0 (NEUROSURGERY SUPPLIES) ×1 IMPLANT
GAUZE 4X4 16PLY ~~LOC~~+RFID DBL (SPONGE) ×1 IMPLANT
GAUZE SPONGE 4X4 12PLY STRL (GAUZE/BANDAGES/DRESSINGS) ×1 IMPLANT
GENERATOR PULSE INSPIRE (Generator) ×1 IMPLANT
GENERATOR PULSE INSPIRE IV (Generator) ×1 IMPLANT
GLOVE BIO SURGEON STRL SZ 6.5 (GLOVE) IMPLANT
GLOVE BIO SURGEON STRL SZ7.5 (GLOVE) ×1 IMPLANT
GLOVE BIOGEL PI IND STRL 7.0 (GLOVE) IMPLANT
GLOVE BIOGEL PI IND STRL 7.5 (GLOVE) IMPLANT
GLOVE SURG SS PI 6.5 STRL IVOR (GLOVE) IMPLANT
GLOVE SURG SS PI 7.0 STRL IVOR (GLOVE) IMPLANT
GOWN STRL REUS W/ TWL LRG LVL3 (GOWN DISPOSABLE) ×3 IMPLANT
IV CATH 18G SAFETY (IV SOLUTION) ×1 IMPLANT
KIT NEURO ACCESSORY W/WRENCH (MISCELLANEOUS) IMPLANT
LEAD SENSING RESP INSPIRE (Lead) ×1 IMPLANT
LEAD SENSING RESP INSPIRE IV (Lead) ×1 IMPLANT
LEAD SLEEP STIM INSPIRE IV/V (Lead) ×1 IMPLANT
LEAD SLEEP STIMULATION INSPIRE (Lead) ×1 IMPLANT
LOOP VASCLR MAXI BLUE 18IN ST (MISCELLANEOUS) ×1 IMPLANT
LOOP VESSEL MINI RED (MISCELLANEOUS) ×1 IMPLANT
MARKER SKIN DUAL TIP RULER LAB (MISCELLANEOUS) ×1 IMPLANT
NDL HYPO 25X1 1.5 SAFETY (NEEDLE) ×1 IMPLANT
NEEDLE HYPO 25X1 1.5 SAFETY (NEEDLE) ×1 IMPLANT
NS IRRIG 1000ML POUR BTL (IV SOLUTION) ×1 IMPLANT
PACK BASIN DAY SURGERY FS (CUSTOM PROCEDURE TRAY) ×1 IMPLANT
PACK ENT DAY SURGERY (CUSTOM PROCEDURE TRAY) ×1 IMPLANT
PASSER CATH 36 CODMAN DISP (NEUROSURGERY SUPPLIES) IMPLANT
PASSER CATH 38CM DISP (INSTRUMENTS) IMPLANT
PENCIL SMOKE EVACUATOR (MISCELLANEOUS) ×1 IMPLANT
PROBE NERVE STIMULATOR (NEUROSURGERY SUPPLIES) ×1 IMPLANT
REMOTE CONTROL SLEEP INSPIRE (MISCELLANEOUS) ×1 IMPLANT
SET WALTER ACTIVATION W/DRAPE (SET/KITS/TRAYS/PACK) ×1 IMPLANT
SLEEVE SCD COMPRESS KNEE MED (STOCKING) ×1 IMPLANT
SPONGE INTESTINAL PEANUT (DISPOSABLE) ×1 IMPLANT
SUT SILK 2 0 SH (SUTURE) ×1 IMPLANT
SUT SILK 3 0 REEL (SUTURE) ×1 IMPLANT
SUT SILK 3 0 SH 30 (SUTURE) ×1 IMPLANT
SUT VIC AB 3-0 SH 27X BRD (SUTURE) ×2 IMPLANT
SUT VIC AB 4-0 PS2 27 (SUTURE) ×2 IMPLANT
SUTURE SILK 3-0 RB1 30XBRD (SUTURE) ×1 IMPLANT
SYR 10ML LL (SYRINGE) ×1 IMPLANT
SYR BULB EAR ULCER 3OZ GRN STR (SYRINGE) ×1 IMPLANT
TOWEL GREEN STERILE FF (TOWEL DISPOSABLE) ×2 IMPLANT

## 2024-05-17 NOTE — Anesthesia Postprocedure Evaluation (Signed)
 Anesthesia Post Note  Patient: Kelly Warner  Procedure(s) Performed: INSERTION, HYPOGLOSSAL NERVE STIMULATOR (Right: Chest)     Patient location during evaluation: PACU Anesthesia Type: General Level of consciousness: awake and alert Pain management: pain level controlled Vital Signs Assessment: post-procedure vital signs reviewed and stable Respiratory status: spontaneous breathing, nonlabored ventilation, respiratory function stable and patient connected to nasal cannula oxygen Cardiovascular status: blood pressure returned to baseline and stable Postop Assessment: no apparent nausea or vomiting Anesthetic complications: no   No notable events documented.  Last Vitals:  Vitals:   05/17/24 1345 05/17/24 1400  BP: (!) 163/88 (!) 164/85  Pulse: 73 75  Resp: 18 17  Temp:    SpO2: 100% 100%    Last Pain:  Vitals:   05/17/24 1400  PainSc: 1                  Lakisha Peyser

## 2024-05-17 NOTE — Op Note (Signed)

## 2024-05-17 NOTE — Transfer of Care (Signed)
 Immediate Anesthesia Transfer of Care Note  Patient: Kelly Warner  Procedure(s) Performed: INSERTION, HYPOGLOSSAL NERVE STIMULATOR (Right: Chest)  Patient Location: PACU  Anesthesia Type:General  Level of Consciousness: awake, alert , and oriented  Airway & Oxygen Therapy: Patient Spontanous Breathing and Patient connected to face mask oxygen  Post-op Assessment: Report given to RN and Post -op Vital signs reviewed and stable  Post vital signs: Reviewed and stable  Last Vitals:  Vitals Value Taken Time  BP 120/59 05/17/24 13:30  Temp    Pulse 71 05/17/24 13:36  Resp 14 05/17/24 13:36  SpO2 99 % 05/17/24 13:36  Vitals shown include unfiled device data.  Last Pain:  Vitals:   05/17/24 1030  PainSc: 0-No pain         Complications: No notable events documented.

## 2024-05-17 NOTE — Anesthesia Preprocedure Evaluation (Signed)
 Anesthesia Evaluation  Patient identified by MRN, date of birth, ID band Patient awake    Reviewed: Allergy & Precautions, NPO status , Patient's Chart, lab work & pertinent test results  History of Anesthesia Complications Negative for: history of anesthetic complications  Airway Mallampati: II  TM Distance: >3 FB Neck ROM: Full    Dental  (+) Dental Advisory Given, Teeth Intact   Pulmonary sleep apnea    Pulmonary exam normal        Cardiovascular hypertension, + angina  + CAD and + Past MI  Normal cardiovascular exam     Neuro/Psych  Headaches  Anxiety     CVA, No Residual Symptoms  negative psych ROS   GI/Hepatic Neg liver ROS,GERD  Controlled and Medicated,,  Endo/Other  Hypothyroidism    Renal/GU negative Renal ROS     Musculoskeletal negative musculoskeletal ROS (+) Arthritis ,    Abdominal   Peds  Hematology negative hematology ROS (+)   Anesthesia Other Findings On GLP-1a   Reproductive/Obstetrics                              Anesthesia Physical Anesthesia Plan  ASA: 3  Anesthesia Plan: General   Post-op Pain Management: Minimal or no pain anticipated, Tylenol  PO (pre-op)* and Precedex   Induction: Intravenous  PONV Risk Score and Plan: 2 and Treatment may vary due to age or medical condition, Ondansetron  and Dexamethasone   Airway Management Planned: Oral ETT  Additional Equipment: None  Intra-op Plan:   Post-operative Plan: Extubation in OR  Informed Consent: I have reviewed the patients History and Physical, chart, labs and discussed the procedure including the risks, benefits and alternatives for the proposed anesthesia with the patient or authorized representative who has indicated his/her understanding and acceptance.       Plan Discussed with: CRNA and Anesthesiologist  Anesthesia Plan Comments: (  )         Anesthesia Quick Evaluation

## 2024-05-17 NOTE — H&P (Signed)
 Kelly Warner is an 68 y.o. female.   Chief Complaint: Sleep apnea HPI: 68 year old female with sleep apnea who has not been able to tolerate CPAP.  Past Medical History:  Diagnosis Date   Anxiety    Arthritis    hands   Coronary artery disease    GERD (gastroesophageal reflux disease)    Hypertension    Hypothyroidism    Migraines    NSTEMI (non-ST elevation myocardial infarction) (HCC)    Sleep apnea    no CPAP   Stroke (HCC) 2018   no deficits    Past Surgical History:  Procedure Laterality Date   ABDOMINAL HYSTERECTOMY     CARDIAC CATHETERIZATION     COLONOSCOPY WITH PROPOFOL  N/A 04/19/2020   Procedure: COLONOSCOPY WITH PROPOFOL ;  Surgeon: Kristie Lamprey, MD;  Location: [facility];  Service: Endoscopy;  Laterality: N/A;   DRUG INDUCED ENDOSCOPY Bilateral 11/26/2023   Procedure: DRUG INDUCED SLEEP ENDOSCOPY;  Surgeon: Carlie Clark, MD;  Location: [facility] ENDOSCOPY;  Service: ENT;  Laterality: Bilateral;   LEFT HEART CATH AND CORONARY ANGIOGRAPHY N/A 08/14/2021   Procedure: LEFT HEART CATH AND CORONARY ANGIOGRAPHY;  Surgeon: Elmira Newman PARAS, MD;  Location: [facility];  Service: Cardiovascular;  Laterality: N/A;   NASAL FRACTURE SURGERY     POLYPECTOMY  04/19/2020   Procedure: POLYPECTOMY;  Surgeon: Kristie Lamprey, MD;  Location: [facility];  Service: Endoscopy;;   SHOULDER ARTHROSCOPY Right    TONSILLECTOMY      Family History  Problem Relation Age of Onset   Atrial fibrillation Mother 80   Hypertension Mother    Heart attack Father 71   Atrial fibrillation Sister 88   Atrial fibrillation Brother 50   Hypertension Brother    Social History:  reports that she has never smoked. She has never used smokeless tobacco. She reports that she does not currently use alcohol. She reports that she does not use drugs.  Allergies:  Allergies  Allergen Reactions   Wasp Venom Anaphylaxis   Isosorbide  Nitrate Other (See Comments)    Headache, nausea and vomiting.    Oxybutynin Other (See Comments)    Headaches   Headaches   Lisinopril Other (See Comments)    Incontinence   Incontinence   Lactose Intolerance (Gi) Other (See Comments)    GI upset    Medications Prior to Admission  Medication Sig Dispense Refill   aspirin  (ASPIRIN  CHILDRENS) 81 MG chewable tablet Chew 1 tablet (81 mg total) by mouth every other day.     atorvastatin  (LIPITOR ) 20 MG tablet Take 20 mg by mouth every evening.     citalopram  (CELEXA ) 10 MG tablet Take 10 mg by mouth daily.     dexlansoprazole (DEXILANT) 60 MG capsule Take 60 mg by mouth at bedtime.     fexofenadine (ALLEGRA) 180 MG tablet Take 180 mg by mouth as needed.     levothyroxine  (SYNTHROID ) 25 MCG tablet Take 25 mcg by mouth daily before breakfast.     mometasone (NASONEX) 50 MCG/ACT nasal spray Place 2 sprays into the nose as needed.     nitroGLYCERIN  (NITROSTAT ) 0.4 MG SL tablet Place 1 tablet (0.4 mg total) under the tongue every 5 (five) minutes as needed for chest pain. 30 tablet 3   Polyethyl Glycol-Propyl Glycol (SYSTANE) 0.4-0.3 % SOLN Place 1-2 drops into both eyes 3 (three) times daily as needed (dry/irritated eyes.).     Vitamin D, Ergocalciferol, (DRISDOL) 1.25 MG (50000 UNIT) CAPS capsule Take 50,000  Units by mouth once a week.     EPINEPHrine  0.3 mg/0.3 mL IJ SOAJ injection Inject 0.3 mg into the muscle as needed for anaphylaxis. 1 each 3   WEGOVY 0.25 MG/0.5ML SOAJ Inject into the skin.      No results found for this or any previous visit (from the past 48 hours). No results found.  Review of Systems  All other systems reviewed and are negative.   Blood pressure (!) 145/63, pulse 71, temperature 98.6 F (37 C), resp. rate 18, height 5' 4 (1.626 m), weight 64.9 kg, SpO2 97%. Physical Exam Constitutional:      Appearance: Normal appearance. She is normal weight.  HENT:     Head: Normocephalic and atraumatic.     Right Ear: External ear normal.     Left Ear: External ear normal.      Nose: Nose normal.     Mouth/Throat:     Mouth: Mucous membranes are moist.     Pharynx: Oropharynx is clear.  Eyes:     Extraocular Movements: Extraocular movements intact.     Pupils: Pupils are equal, round, and reactive to light.  Cardiovascular:     Rate and Rhythm: Normal rate.  Pulmonary:     Effort: Pulmonary effort is normal.  Skin:    General: Skin is warm and dry.  Neurological:     General: No focal deficit present.     Mental Status: She is alert and oriented to person, place, and time.  Psychiatric:        Mood and Affect: Mood normal.        Behavior: Behavior normal.        Thought Content: Thought content normal.        Judgment: Judgment normal.      Assessment/Plan Obstructive sleep apnea and BMI 24.56  To OR for hypoglossal nerve stimulator placement.  Vaughan Ricker, MD 05/17/2024, 11:28 AM

## 2024-05-17 NOTE — Anesthesia Procedure Notes (Addendum)
 Procedure Name: Intubation Date/Time: 05/17/2024 11:47 AM  Performed by: Buster Catheryn SAUNDERS, CRNAPre-anesthesia Checklist: Patient identified, Emergency Drugs available, Suction available and Patient being monitored Patient Re-evaluated:Patient Re-evaluated prior to induction Oxygen Delivery Method: Circle system utilized Preoxygenation: Pre-oxygenation with 100% oxygen Induction Type: IV induction Ventilation: Mask ventilation without difficulty Laryngoscope Size: Miller and 2 Grade View: Grade II Tube type: Oral Tube size: 7.0 mm Number of attempts: 1 Airway Equipment and Method: Stylet Placement Confirmation: ETT inserted through vocal cords under direct vision, positive ETCO2 and breath sounds checked- equal and bilateral Secured at: 22 cm Tube secured with: Tape Dental Injury: Teeth and Oropharynx as per pre-operative assessment

## 2024-05-17 NOTE — Discharge Instructions (Addendum)
 No Tylenol  until 4:30pm today, if needed.   Post Anesthesia Home Care Instructions  Activity: Get plenty of rest for the remainder of the day. A responsible individual must stay with you for 24 hours following the procedure.  For the next 24 hours, DO NOT: -Drive a car -Advertising copywriter -Drink alcoholic beverages -Take any medication unless instructed by your physician -Make any legal decisions or sign important papers.  Meals: Start with liquid foods such as gelatin or soup. Progress to regular foods as tolerated. Avoid greasy, spicy, heavy foods. If nausea and/or vomiting occur, drink only clear liquids until the nausea and/or vomiting subsides. Call your physician if vomiting continues.  Special Instructions/Symptoms: Your throat may feel dry or sore from the anesthesia or the breathing tube placed in your throat during surgery. If this causes discomfort, gargle with warm salt water. The discomfort should disappear within 24 hours.  If you had a scopolamine patch placed behind your ear for the management of post- operative nausea and/or vomiting:  1. The medication in the patch is effective for 72 hours, after which it should be removed.  Wrap patch in a tissue and discard in the trash. Wash hands thoroughly with soap and water. 2. You may remove the patch earlier than 72 hours if you experience unpleasant side effects which may include dry mouth, dizziness or visual disturbances. 3. Avoid touching the patch. Wash your hands with soap and water after contact with the patch.

## 2024-05-18 ENCOUNTER — Encounter (HOSPITAL_BASED_OUTPATIENT_CLINIC_OR_DEPARTMENT_OTHER): Payer: Self-pay | Admitting: Otolaryngology

## 2024-06-06 ENCOUNTER — Telehealth: Payer: Self-pay | Admitting: Pulmonary Disease

## 2024-06-06 NOTE — Telephone Encounter (Signed)
 Copied from CRM [id number]. Topic: Appointments - Appointment Scheduling >> Jun 03, 2024  2:14 PM Rilla B wrote: Patient has had inspire surgery (Dr Carlie)  and needs to set up appt for equip with dr Neda. Please call patient to go over next steps and to set up appt @ [phone].   Patient had implant completed on 05/17/2024 by Dr. Carlie. According to his last notes from her f/u on 05/23/2024  The activation of the device will be coordinated with sleep partners, and instructions on its use will be provided. Wanted to check prior to further outreach if there has been any communication regarding this patient's inspire and her readiness for activation. Her last OV with Dr. Neda was 11/25/2021 and no communication from Dr. Carlie at this time to [facility].

## 2024-06-07 NOTE — Telephone Encounter (Signed)
 Mrs. Randol is now scheduled for INSPIRE activation, as well as two follow-up appointments. She is aware, printed and mailed reminder. No further action is needed at this time.

## 2024-06-16 ENCOUNTER — Ambulatory Visit: Admitting: Pulmonary Disease

## 2024-06-16 ENCOUNTER — Encounter: Payer: Self-pay | Admitting: Pulmonary Disease

## 2024-06-16 VITALS — BP 122/72 | HR 73 | Temp 98.2°F | Ht 64.0 in | Wt 149.0 lb

## 2024-06-16 DIAGNOSIS — G4733 Obstructive sleep apnea (adult) (pediatric): Secondary | ICD-10-CM

## 2024-06-16 MED ORDER — RAMELTEON 8 MG PO TABS: 8.0000 mg | ORAL_TABLET | Freq: Every day | ORAL | 2 refills | Status: DC

## 2024-06-16 NOTE — Patient Instructions (Signed)
 We will see you back in about 4 weeks  We can work on having you on a medication that helps your sleep  As I mentioned that the only medication that may help sleep without relaxing the muscles of the back of the throat more than usual is called ramelteon I can put in a prescription for you to try  We can try other agents if the ramelteon does not work for you once we activate your device  The hope is that if your tongue, back of your throat is not sore at the next visit we should be able to activate the device

## 2024-06-16 NOTE — Progress Notes (Signed)
 Kelly Warner    [id number]    [birth year]  Primary Care Physician:Kaplan, Josette ORN., PA-C  Referring Physician: Debrah Josette ORN., PA-C [address]  [place] [address]  Chief complaint:   Patient with obstructive sleep apnea status post hypoglossal nerve stimulator implantation  HPI:  Comes in today for activation  Notes some tenderness on the side of her tongue Tongue protrusion also did cause discomfort  Hypoglossal nerve insertion 05/17/2024  She does have a history of obstructive sleep apnea, did not tolerate CPAP therapy despite pressure adjustments and trying different masks  Sleep apnea he remains about the same goes to bed between 10 and 11, falls asleep okay Wakes up about 7 AM   Sleep study shows AHI of 24.5 with saturations of 84%  History of allergy to mold and dust Does have a pet dog  No pertinent occupational history  Outpatient Encounter Medications as of 06/16/2024  Medication Sig   ramelteon (ROZEREM) 8 MG tablet Take 1 tablet (8 mg total) by mouth at bedtime.   aspirin  (ASPIRIN  CHILDRENS) 81 MG chewable tablet Chew 1 tablet (81 mg total) by mouth every other day.   atorvastatin  (LIPITOR ) 20 MG tablet Take 20 mg by mouth every evening.   citalopram  (CELEXA ) 10 MG tablet Take 10 mg by mouth daily.   dexlansoprazole (DEXILANT) 60 MG capsule Take 60 mg by mouth at bedtime.   EPINEPHrine  0.3 mg/0.3 mL IJ SOAJ injection Inject 0.3 mg into the muscle as needed for anaphylaxis.   fexofenadine (ALLEGRA) 180 MG tablet Take 180 mg by mouth as needed.   HYDROcodone -acetaminophen  (NORCO/VICODIN) 5-325 MG tablet Take 1-2 tablets by mouth every 6 (six) hours as needed.   levothyroxine  (SYNTHROID ) 25 MCG tablet Take 25 mcg by mouth daily before breakfast.   mometasone (NASONEX) 50 MCG/ACT nasal spray Place 2 sprays into the nose as needed.   nitroGLYCERIN  (NITROSTAT ) 0.4 MG SL tablet Place 1 tablet (0.4 mg total) under the tongue every 5  (five) minutes as needed for chest pain.   Polyethyl Glycol-Propyl Glycol (SYSTANE) 0.4-0.3 % SOLN Place 1-2 drops into both eyes 3 (three) times daily as needed (dry/irritated eyes.).   Vitamin D, Ergocalciferol, (DRISDOL) 1.25 MG (50000 UNIT) CAPS capsule Take 50,000 Units by mouth once a week.   WEGOVY 0.25 MG/0.5ML SOAJ Inject into the skin.   No facility-administered encounter medications on file as of 06/16/2024.    Allergies as of 06/16/2024 - Review Complete 05/17/2024  Allergen Reaction Noted   Wasp venom Anaphylaxis 05/17/2024   Isosorbide  nitrate Other (See Comments) 08/21/2021   Oxybutynin Other (See Comments) 08/21/2022   Lisinopril Other (See Comments) 08/21/2022   Lactose intolerance (gi) Other (See Comments) 04/09/2020    Past Medical History:  Diagnosis Date   Anxiety    Arthritis    hands   Coronary artery disease    GERD (gastroesophageal reflux disease)    Hypertension    Hypothyroidism    Migraines    NSTEMI (non-ST elevation myocardial infarction) (HCC)    Sleep apnea    no CPAP   Stroke (HCC) 2018   no deficits    Past Surgical History:  Procedure Laterality Date   ABDOMINAL HYSTERECTOMY     CARDIAC CATHETERIZATION     COLONOSCOPY WITH PROPOFOL  N/A 04/19/2020   Procedure: COLONOSCOPY WITH PROPOFOL ;  Surgeon: Kristie Lamprey, MD;  Location: [facility];  Service: Endoscopy;  Laterality: N/A;   DRUG INDUCED ENDOSCOPY  Bilateral 11/26/2023   Procedure: DRUG INDUCED SLEEP ENDOSCOPY;  Surgeon: Carlie Clark, MD;  Location: [facility] ENDOSCOPY;  Service: ENT;  Laterality: Bilateral;   IMPLANTATION OF HYPOGLOSSAL NERVE STIMULATOR Right 05/17/2024   Procedure: INSERTION, HYPOGLOSSAL NERVE STIMULATOR;  Surgeon: Carlie Clark, MD;  Location: [facility];  Service: ENT;  Laterality: Right;   LEFT HEART CATH AND CORONARY ANGIOGRAPHY N/A 08/14/2021   Procedure: LEFT HEART CATH AND CORONARY ANGIOGRAPHY;  Surgeon: Elmira Newman PARAS, MD;  Location: [facility];  Service: Cardiovascular;  Laterality: N/A;   NASAL FRACTURE SURGERY     POLYPECTOMY  04/19/2020   Procedure: POLYPECTOMY;  Surgeon: Kristie Lamprey, MD;  Location: [facility];  Service: Endoscopy;;   SHOULDER ARTHROSCOPY Right    TONSILLECTOMY      Family History  Problem Relation Age of Onset   Atrial fibrillation Mother 63   Hypertension Mother    Heart attack Father 21   Atrial fibrillation Sister 74   Atrial fibrillation Brother 50   Hypertension Brother     Social History   Socioeconomic History   Marital status: Married    Spouse name: Alm   Number of children: 3   Years of education: 4   Highest education level: Not on file  Occupational History    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  Tobacco Use   Smoking status: Never   Smokeless tobacco: Never  Vaping Use   Vaping status: Never Used  Substance and Sexual Activity   Alcohol use: Not Currently   Drug use: No   Sexual activity: Yes    Birth control/protection: Surgical    Comment: hyst  Other Topics Concern   Not on file  Social History Narrative   Lives with husband   Caffeine - none   Social Drivers of Health   Financial Resource Strain: Low Risk (11/11/2022)   Received from [facility] Short Social Needs Screening - Medical Financial Resource Strain    Would you like help with any of the following needs? (Select ALL that apply): I don't want help with any of these  Food Insecurity: Low Risk  (04/18/2024)   Received from [facility]   Hunger Vital Sign    Within the past 12 months, you worried that your food would run out before you got money to buy more: Never true    Within the past 12 months, the food you bought just didn't last and you didn't have money to get more. : Never true  Transportation Needs: No Transportation Needs (04/18/2024)   Received from [facility]    In the past 12 months, has lack of reliable transportation kept you from  medical appointments, meetings, work or from getting things needed for daily living? : No  Physical Activity: Not on file  Stress: Not on file  Social Connections: Socially Integrated (04/16/2024)   Social Connection and Isolation Panel    Frequency of Communication with Friends and Family: Three times a week    Frequency of Social Gatherings with Friends and Family: Three times a week    Attends Religious Services: More than 4 times per year    Active Member of Clubs or Organizations: Yes    Attends Banker Meetings: More than 4 times per year    Marital Status: Married  Catering manager Violence: Not At Risk (04/17/2024)   Humiliation, Afraid, Rape, and Kick questionnaire    Fear of Current  or Ex-Partner: No    Emotionally Abused: No    Physically Abused: No    Sexually Abused: No    Review of Systems  Respiratory:  Positive for apnea.   Psychiatric/Behavioral:  Positive for sleep disturbance.     Vitals:   06/16/24 1351  BP: 122/72  Pulse: 73  Temp: 98.2 F (36.8 C)  SpO2: 95%     Physical Exam Constitutional:      Appearance: She is obese.  HENT:     Head: Normocephalic.     Nose: Nose normal.     Mouth/Throat:     Mouth: Mucous membranes are moist.  Eyes:     General: No scleral icterus. Cardiovascular:     Rate and Rhythm: Normal rate and regular rhythm.     Heart sounds: No murmur heard.    No friction rub.  Pulmonary:     Effort: No respiratory distress.     Breath sounds: No stridor. No wheezing or rhonchi.  Musculoskeletal:     Cervical back: No rigidity or tenderness.  Neurological:     Mental Status: She is alert.  Psychiatric:        Mood and Affect: Mood normal.    Data Reviewed: Sleep study 10/23/2021 reviewed showing moderate obstructive sleep apnea  Assessment/Plan: With ongoing pain and discomfort with tongue protrusion, discomfort at the site of the tongue Decision was made not to activate the device today  Device remote  training was provided  Will see patient again in about 4 weeks to decide on her activation  Encouraged to call us  with significant concerns  She does have insomnia Prescription for ramelteon was placed  She may require a sleep aid for insomnia once device is activated   Jennet Epley MD [facility] 06/16/2024, 2:15 PM  CC: Debrah Josette ORN., PA-C

## 2024-06-17 ENCOUNTER — Telehealth: Payer: Self-pay

## 2024-06-17 ENCOUNTER — Other Ambulatory Visit (HOSPITAL_COMMUNITY): Payer: Self-pay

## 2024-06-17 NOTE — Telephone Encounter (Signed)
*  Pulm  Pharmacy Patient Advocate Encounter   Received notification from Fax that prior authorization for Rameleton 8mg  is required/requested.   Insurance verification completed.   The patient is insured through [employer].   Per test claim:  Trazodone, Belsomra, Zolpidem or Eszopiclone is preferred by the insurance.  If suggested medication is appropriate, Please send in a new RX and discontinue this one. If not, please advise as to why it's not appropriate so that we may request a Prior Authorization. Please note, some preferred medications may still require a PA.  If the suggested medications have not been trialed and there are no contraindications to their use, the PA will not be submitted, as it will not be approved.

## 2024-06-17 NOTE — Telephone Encounter (Signed)
 Patient has obstructive sleep apnea with an inspire device in place that is not yet activated  Struggling with insomnia  Ramelteon does not depress the central nervous system and does not relax the pharyngeal muscles, this can be used for insomnia compared with the other medications. Goal is to transition to other medications once inspire device is activated and we are managing patient's sleep apnea

## 2024-06-18 ENCOUNTER — Other Ambulatory Visit: Payer: Self-pay | Admitting: Pulmonary Disease

## 2024-06-18 ENCOUNTER — Encounter: Payer: Self-pay | Admitting: Pulmonary Disease

## 2024-06-18 MED ORDER — BELSOMRA 10 MG PO TABS: 10.0000 mg | ORAL_TABLET | Freq: Every evening | ORAL | 1 refills | Status: AC

## 2024-06-18 NOTE — Telephone Encounter (Signed)
 Order for Belsomra  placed.

## 2024-06-21 NOTE — Telephone Encounter (Signed)
 Please advise on preference of med, I have also sent to PA team

## 2024-06-22 ENCOUNTER — Telehealth: Payer: Self-pay

## 2024-06-22 NOTE — Telephone Encounter (Signed)
 PA needed for ramelton

## 2024-06-22 NOTE — Telephone Encounter (Signed)
 [facility] sending request rx for Ramelteon 8 mg.  Dr. Neda, please advise.  See telephone message from 06/18/2024.  There is also  prior authorization paperwork for you to fill out on your desk in [facility].

## 2024-06-22 NOTE — Telephone Encounter (Signed)
Paperwork has been received. 

## 2024-06-23 ENCOUNTER — Encounter: Payer: Self-pay | Admitting: Pulmonary Disease

## 2024-06-23 ENCOUNTER — Other Ambulatory Visit: Payer: Self-pay | Admitting: Pulmonary Disease

## 2024-06-23 MED ORDER — RAMELTEON 8 MG PO TABS: 8.0000 mg | ORAL_TABLET | Freq: Every day | ORAL | 3 refills | Status: AC

## 2024-06-23 NOTE — Telephone Encounter (Signed)
 Prior auth paperwork filled out by Dr.Olalere and faxed to [facility].

## 2024-06-23 NOTE — Progress Notes (Signed)
Rozerem sent in

## 2024-07-01 NOTE — Telephone Encounter (Signed)
 The papers were shredded,It was a form saying one was needed not started.

## 2024-07-03 ENCOUNTER — Encounter: Payer: Self-pay | Admitting: Pulmonary Disease

## 2024-07-04 ENCOUNTER — Telehealth: Payer: Self-pay

## 2024-07-04 NOTE — Telephone Encounter (Signed)
*  Pulm  Pharmacy Patient Advocate Encounter   Received notification from Pt Calls Messages that prior authorization for Ramelteon 8MG  tablets  is required/requested.   Insurance verification completed.   The patient is insured through [employer].   Per test claim: PA required; PA submitted to above mentioned insurance via Latent Key/confirmation #/EOC N/A Status is pending   *denial already determined, having insurance send over denial papers to look into appeal.

## 2024-07-04 NOTE — Telephone Encounter (Signed)
 When trying to start a request- [employer] shows a case already open, this may be due to the patients message that [facility] sent over Prior Authorization forms. Calling insurance to have forms re-sent to complete.

## 2024-07-05 ENCOUNTER — Telehealth: Payer: Self-pay | Admitting: *Deleted

## 2024-07-05 ENCOUNTER — Other Ambulatory Visit: Payer: Self-pay | Admitting: Pulmonary Disease

## 2024-07-05 MED ORDER — TRAZODONE HCL 50 MG PO TABS: 50.0000 mg | ORAL_TABLET | Freq: Every day | ORAL | 0 refills | Status: DC

## 2024-07-05 NOTE — Telephone Encounter (Signed)
 Patient wants to know if she should be taking Trazodone and Citalopram  together?   Copied from CRM [id number]. Topic: General - Other >> Jul 05, 2024  3:14 PM [name] wrote: Reason for CRM: Patient is requesting [name] call her back, as she has questions regarding the trazadone. Patient is curios if she should be taking that and citalopram  (?) together. Please return call to patient.

## 2024-07-05 NOTE — Telephone Encounter (Signed)
 Order for trazodone placed to be tried  I am not sure there is any medication that can guarantee that you sleep for more than 5 straight hours  What we may end up with is better than previous, if we do not want to risk side effects

## 2024-07-05 NOTE — Telephone Encounter (Signed)
Dr. Olalere, please advise. 

## 2024-07-05 NOTE — Telephone Encounter (Signed)
 Denial letter received due to no preferred alternative tried and failed.   Preferred alternatives are: Trazodone,  Zolpidem and Eszopiclone tablets  Full denial letter attached in patients media.

## 2024-07-05 NOTE — Telephone Encounter (Signed)
 Patient advised that Trazodone has been sent to pharmacy for trial.

## 2024-07-12 ENCOUNTER — Telehealth: Payer: Self-pay

## 2024-07-12 NOTE — Telephone Encounter (Unsigned)
 Copied from CRM [id number]. Topic: Clinical - Prescription Issue >> Jul 11, 2024 12:09 PM Devaughn RAMAN wrote: Reason for CRM: Deward with [facility] called regarding pt medication ramelteon (ROZEREM) 8 MG tablet medication is $787.09. The pharmacy Is requesting an alternate medication of tragodone/ golpidem 5 mg 10 mg. Need provider approval to change medication, requesting new rx.  [facility] [phone] [phone]

## 2024-07-12 NOTE — Telephone Encounter (Signed)
 Fax sent to [facility] for Ramelton sent to pharmacy confirmation received

## 2024-07-13 NOTE — Telephone Encounter (Signed)
 Prescription for Ramelteon 8 mg received from [facility].  Form completed and faxed to [phone] on 06/23/2024.  Confirmation fax received that it was sent successfully.   Sent to scan.  Nothing further needed.

## 2024-07-27 ENCOUNTER — Ambulatory Visit: Admitting: Pulmonary Disease

## 2024-07-27 ENCOUNTER — Encounter: Payer: Self-pay | Admitting: Pulmonary Disease

## 2024-07-27 VITALS — BP 126/80 | HR 75 | Ht 64.0 in | Wt 150.6 lb

## 2024-07-27 DIAGNOSIS — G4700 Insomnia, unspecified: Secondary | ICD-10-CM

## 2024-07-27 DIAGNOSIS — G4733 Obstructive sleep apnea (adult) (pediatric): Secondary | ICD-10-CM

## 2024-07-27 MED ORDER — TRAZODONE HCL 50 MG PO TABS: 50.0000 mg | ORAL_TABLET | Freq: Every day | ORAL | 3 refills | Status: AC

## 2024-07-27 NOTE — Patient Instructions (Signed)
 Keep your appointment in December as already scheduled  Make sure you are increasing your voltage on a weekly basis as tolerated  Call us  with significant concerns  We will make sure I placed refills for your trazodone   Continue to stay active

## 2024-07-27 NOTE — Progress Notes (Signed)
 Kelly Warner    [id number]    03/03/1956  Primary Care Physician:Kaplan, Josette ORN., PA-C  Referring Physician: Debrah Josette ORN., PA-C [address]  [place] [address]  Chief complaint:   Patient with obstructive sleep apnea s/p hypoglossal nerve stimulator implantation  Hypoglossal nerve insertion 05/17/2024 Last visit 06/16/2024 patient had pain and discomfort decision was made not to activate at the visit Device training was provided at the time Treatment for insomnia started with ramelteon , Belsomra tried, trazodone  tried  Visit with Dr. Carlie 06/27/2024 with vicryl suture irritation  Discussed the use of AI scribe software for clinical note transcription with the patient, who gave verbal consent to proceed.  History of Present Illness Kelly Warner is a 68 year old female who presents with sleep disturbances.  She experiences sleep disturbances and uses trazodone  to aid her sleep. She takes trazodone  at 9 PM and goes to bed around 10:30 or 11 PM. The medication improves her sleep quality, but it takes time to take effect. She sometimes falls asleep immediately, other times not, but once asleep, she sleeps until 7 AM. Despite improvement, she occasionally wakes up feeling tired, which she attributes to breathing issues. No swallowing issues, voice changes, pain, or discomfort.  She is undergoing treatment with a nerve stimulation device for her sleep disturbances.  She recently had ENT follow-up regarding an inflamed area with exudate and blood around implantation site. She applied hydrocortisone, which improved the condition significantly. The area is now only sensitive when pressure is applied, such as when her grandchildren jump on her lap.    Outpatient Encounter Medications as of 07/27/2024  Medication Sig   ramelteon  (ROZEREM ) 8 MG tablet Take 1 tablet (8 mg total) by mouth at bedtime.   Suvorexant (BELSOMRA) 10 MG TABS Take 1 tablet (10 mg  total) by mouth at bedtime.   aspirin  (ASPIRIN  CHILDRENS) 81 MG chewable tablet Chew 1 tablet (81 mg total) by mouth every other day.   atorvastatin  (LIPITOR ) 20 MG tablet Take 20 mg by mouth every evening.   citalopram  (CELEXA ) 10 MG tablet Take 10 mg by mouth daily.   dexlansoprazole (DEXILANT) 60 MG capsule Take 60 mg by mouth at bedtime.   EPINEPHrine  0.3 mg/0.3 mL IJ SOAJ injection Inject 0.3 mg into the muscle as needed for anaphylaxis.   fexofenadine (ALLEGRA) 180 MG tablet Take 180 mg by mouth as needed.   HYDROcodone -acetaminophen  (NORCO/VICODIN) 5-325 MG tablet Take 1-2 tablets by mouth every 6 (six) hours as needed.   levothyroxine  (SYNTHROID ) 25 MCG tablet Take 25 mcg by mouth daily before breakfast.   mometasone (NASONEX) 50 MCG/ACT nasal spray Place 2 sprays into the nose as needed.   nitroGLYCERIN  (NITROSTAT ) 0.4 MG SL tablet Place 1 tablet (0.4 mg total) under the tongue every 5 (five) minutes as needed for chest pain.   Polyethyl Glycol-Propyl Glycol (SYSTANE) 0.4-0.3 % SOLN Place 1-2 drops into both eyes 3 (three) times daily as needed (dry/irritated eyes.).   traZODone  (DESYREL ) 50 MG tablet Take 1 tablet (50 mg total) by mouth at bedtime.   Vitamin D, Ergocalciferol, (DRISDOL) 1.25 MG (50000 UNIT) CAPS capsule Take 50,000 Units by mouth once a week.   WEGOVY 0.25 MG/0.5ML SOAJ Inject into the skin.   No facility-administered encounter medications on file as of 07/27/2024.    Allergies as of 07/27/2024 - Review Complete 05/17/2024  Allergen Reaction Noted   Wasp venom Anaphylaxis 05/17/2024  Isosorbide  nitrate Other (See Comments) 08/21/2021   Oxybutynin Other (See Comments) 08/21/2022   Lisinopril Other (See Comments) 08/21/2022   Lactose intolerance (gi) Other (See Comments) 04/09/2020    Past Medical History:  Diagnosis Date   Anxiety    Arthritis    hands   Coronary artery disease    GERD (gastroesophageal reflux disease)    Hypertension    Hypothyroidism     Migraines    NSTEMI (non-ST elevation myocardial infarction) (HCC)    Sleep apnea    no CPAP   Stroke (HCC) 2018   no deficits    Past Surgical History:  Procedure Laterality Date   ABDOMINAL HYSTERECTOMY     CARDIAC CATHETERIZATION     COLONOSCOPY WITH PROPOFOL  N/A 04/19/2020   Procedure: COLONOSCOPY WITH PROPOFOL ;  Surgeon: Kristie Lamprey, MD;  Location: [facility];  Service: Endoscopy;  Laterality: N/A;   DRUG INDUCED ENDOSCOPY Bilateral 11/26/2023   Procedure: DRUG INDUCED SLEEP ENDOSCOPY;  Surgeon: Carlie Clark, MD;  Location: [facility];  Service: ENT;  Laterality: Bilateral;   IMPLANTATION OF HYPOGLOSSAL NERVE STIMULATOR Right 05/17/2024   Procedure: INSERTION, HYPOGLOSSAL NERVE STIMULATOR;  Surgeon: Carlie Clark, MD;  Location: [facility];  Service: ENT;  Laterality: Right;   LEFT HEART CATH AND CORONARY ANGIOGRAPHY N/A 08/14/2021   Procedure: LEFT HEART CATH AND CORONARY ANGIOGRAPHY;  Surgeon: Elmira Newman PARAS, MD;  Location: [facility];  Service: Cardiovascular;  Laterality: N/A;   NASAL FRACTURE SURGERY     POLYPECTOMY  04/19/2020   Procedure: POLYPECTOMY;  Surgeon: Kristie Lamprey, MD;  Location: [facility];  Service: Endoscopy;;   SHOULDER ARTHROSCOPY Right    TONSILLECTOMY      Family History  Problem Relation Age of Onset   Atrial fibrillation Mother 36   Hypertension Mother    Heart attack Father 92   Atrial fibrillation Sister 64   Atrial fibrillation Brother 50   Hypertension Brother     Social History   Socioeconomic History   Marital status: Married    Spouse name: Alm   Number of children: 3   Years of education: 4   Highest education level: Not on file  Occupational History    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 (Select ALL that apply): I don't want help with any of these  Food Insecurity: Low Risk  (04/18/2024)   Received from [facility]   Hunger Vital Sign    Within the past 12 months, you worried that your food would run out before you got money to buy more: Never true    Within the past 12 months, the food you bought just didn't last and you didn't have money to get more. : Never true  Transportation Needs: No Transportation Needs (04/18/2024)  Received from [facility]    In the past 12 months, has lack of reliable transportation kept you from medical appointments, meetings, work or from getting things needed for daily living? : No  Physical Activity: Not on file  Stress: Not on file  Social Connections: Socially Integrated (04/16/2024)   Social Connection and Isolation Panel    Frequency of Communication with Friends and Family: Three times a week    Frequency of Social Gatherings with Friends and Family: Three times a week    Attends Religious Services: More than 4 times per year    Active Member of Clubs or Organizations: Yes    Attends Banker Meetings: More than 4 times per year    Marital Status: Married  Catering Manager Violence: Not At Risk (04/17/2024)   Humiliation, Afraid, Rape, and Kick questionnaire    Fear of Current or Ex-Partner: No    Emotionally Abused: No    Physically Abused: No    Sexually Abused: No    Review of Systems  Respiratory:  Positive for apnea.   Psychiatric/Behavioral:  Positive for sleep disturbance.      There were no vitals filed for this visit.   Physical Exam Constitutional:      Appearance: Normal appearance.  HENT:     Head: Normocephalic.     Mouth/Throat:     Mouth: Mucous membranes are moist.  Eyes:     Pupils: Pupils are equal, round, and reactive to light.  Cardiovascular:     Rate and Rhythm: Normal rate and regular rhythm.     Heart sounds: No murmur heard.    No friction rub.  Pulmonary:     Effort: No respiratory distress.     Breath sounds: No stridor. No wheezing or rhonchi.  Musculoskeletal:     Cervical back: No rigidity or tenderness.  Neurological:     Mental Status: She is alert.  Psychiatric:        Mood and Affect: Mood normal.    Data Reviewed: Device was interrogated today and activation was performed  Tongue movement threshold-0.7 Functional range-0.71.7 Initial amplitude-0.7 Delay time-60 minutes Pause duration-15 minutes Therapy duration-8 hours  Assessment and Plan Assessment & Plan Obstructive sleep apnea Managed with a device to stimulate the hypoglossal nerve. Initial activation was performed, and the device was set to a starting amplitude of 0.7. The device is expected to synchronize with breathing during sleep, providing a smoother and slower stimulation. She was educated on the device's operation, including the ability to pause it if it interferes with sleep onset or if she wakes up during the night. The goal is to find a comfortable amplitude that does not disrupt sleep. - Set device amplitude to 0.7. - Set pause time to 15 minutes. - Set treatment duration to 8 hours. - Educated on device operation and adjustments.  Insomnia Managed with trazodone , which she reports is effective but takes time to kick in. She is advised to adjust the timing of the medication to find the optimal time for sleep onset. The goal is to minimize side effects while ensuring effective sleep. She is encouraged to skip doses on days when she feels rested  and does not require medication. - Refilled trazodone  prescription. - Advised adjusting trazodone  timing to optimize sleep onset. - Encouraged skipping doses on days when rested.  Follow-up in about 4 weeks, has an appointment for December  Graded exercise as tolerated I encouraged to call with significant concerns  Primary hypertension - Well-controlled  Coronary artery disease - Well-controlled  I personally spent a total of 46 minutes in the care of the patient today including preparing to see the patient, performing a medically appropriate exam/evaluation, counseling and educating, placing orders, documenting clinical information in the EHR, independently interpreting results, and interrogating and adjusting settings for inspire device activation.  Jennet Epley MD [facility] 07/27/2024, 4:47 AM  CC: Debrah Josette ORN., PA-C

## 2024-08-04 NOTE — Telephone Encounter (Signed)
 NFN

## 2024-08-23 ENCOUNTER — Ambulatory Visit (INDEPENDENT_AMBULATORY_CARE_PROVIDER_SITE_OTHER): Admitting: Pulmonary Disease

## 2024-08-23 ENCOUNTER — Encounter: Payer: Self-pay | Admitting: Pulmonary Disease

## 2024-08-23 VITALS — BP 143/84 | HR 74 | Ht 64.0 in | Wt 145.0 lb

## 2024-08-23 DIAGNOSIS — I10 Essential (primary) hypertension: Secondary | ICD-10-CM

## 2024-08-23 DIAGNOSIS — G4733 Obstructive sleep apnea (adult) (pediatric): Secondary | ICD-10-CM | POA: Diagnosis not present

## 2024-08-23 DIAGNOSIS — I2510 Atherosclerotic heart disease of native coronary artery without angina pectoris: Secondary | ICD-10-CM | POA: Diagnosis not present

## 2024-08-23 DIAGNOSIS — G4700 Insomnia, unspecified: Secondary | ICD-10-CM

## 2024-08-23 NOTE — Progress Notes (Deleted)
 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 Carlie 06/27/2024 with vicryl suture irritation  Discussed the use of AI scribe software for clinical note transcription with the patient, who gave verbal consent to proceed.  History of Present Illness Kelly Warner is a 68 year old female who presents with sleep disturbances.  She experiences sleep disturbances and uses trazodone  to aid her sleep. She takes trazodone  at 9 PM and goes to bed around 10:30 or 11 PM. The medication improves her sleep quality, but it takes time to take effect. She sometimes falls asleep immediately, other times not, but once asleep, she sleeps until 7 AM. Despite improvement, she occasionally wakes up feeling tired, which she attributes to breathing issues. No swallowing issues, voice changes, pain, or discomfort.  She is undergoing treatment with a nerve stimulation device for her sleep disturbances.  She recently had ENT follow-up regarding an inflamed area with exudate and blood around implantation site. She applied hydrocortisone, which improved the condition significantly. The area is now only sensitive when pressure is applied, such as when her grandchildren jump on her lap.    Outpatient Encounter Medications as of 08/23/2024  Medication Sig   aspirin  (ASPIRIN  CHILDRENS) 81 MG chewable tablet Chew 1 tablet (81 mg total) by mouth every other day.   atorvastatin  (LIPITOR ) 20 MG  tablet Take 20 mg by mouth every evening.   citalopram  (CELEXA ) 10 MG tablet Take 10 mg by mouth daily.   dexlansoprazole (DEXILANT) 60 MG capsule Take 60 mg by mouth at bedtime.   EPINEPHrine  0.3 mg/0.3 mL IJ SOAJ injection Inject 0.3 mg into the muscle as needed for anaphylaxis.   fexofenadine (ALLEGRA) 180 MG tablet Take 180 mg by mouth as needed.   HYDROcodone -acetaminophen  (NORCO/VICODIN) 5-325 MG tablet Take 1-2 tablets by mouth every 6 (six) hours as needed.   levothyroxine  (SYNTHROID ) 25 MCG tablet Take 25 mcg by mouth daily before breakfast.   mometasone (NASONEX) 50 MCG/ACT nasal spray Place 2 sprays into the nose as needed.   nitroGLYCERIN  (NITROSTAT ) 0.4 MG SL tablet Place 1 tablet (0.4 mg total) under the tongue every 5 (five) minutes as needed for chest pain.   Polyethyl Glycol-Propyl Glycol (SYSTANE) 0.4-0.3 % SOLN Place 1-2 drops into both eyes 3 (three) times daily as needed (dry/irritated eyes.).   ramelteon  (ROZEREM ) 8 MG tablet Take 1 tablet (8 mg total) by mouth at bedtime.   Suvorexant  (BELSOMRA ) 10 MG TABS Take 1 tablet (10 mg total) by mouth at bedtime.   traZODone  (DESYREL ) 50 MG tablet Take 1 tablet (50 mg total) by mouth at bedtime.   Vitamin D, Ergocalciferol, (DRISDOL) 1.25 MG (50000 UNIT) CAPS capsule Take 50,000 Units by mouth once a week.   WEGOVY 0.25 MG/0.5ML SOAJ Inject into the skin.   No facility-administered encounter medications on file as of 08/23/2024.    Allergies as of 08/23/2024 - Review Complete 08/23/2024  Allergen Reaction Noted  Wasp venom Anaphylaxis 05/17/2024   Isosorbide  nitrate Other (See Comments) 08/21/2021   Oxybutynin Other (See Comments) 08/21/2022   Honey bee venom  08/23/2024   Lisinopril Other (See Comments) 08/21/2022   Wasp venom protein  08/23/2024   Lactose intolerance (gi) Other (See Comments) 04/09/2020    Past Medical History:  Diagnosis Date   Anxiety    Arthritis    hands   Coronary artery disease    GERD  (gastroesophageal reflux disease)    Hypertension    Hypothyroidism    Migraines    NSTEMI (non-ST elevation myocardial infarction) (HCC)    Sleep apnea    no CPAP   Stroke (HCC) 2018   no deficits    Past Surgical History:  Procedure Laterality Date   ABDOMINAL HYSTERECTOMY     CARDIAC CATHETERIZATION     COLONOSCOPY WITH PROPOFOL  N/A 04/19/2020   Procedure: COLONOSCOPY WITH PROPOFOL ;  Surgeon: Kristie Lamprey, MD;  Location: [facility];  Service: Endoscopy;  Laterality: N/A;   DRUG INDUCED ENDOSCOPY Bilateral 11/26/2023   Procedure: DRUG INDUCED SLEEP ENDOSCOPY;  Surgeon: Carlie Clark, MD;  Location: [facility] ENDOSCOPY;  Service: ENT;  Laterality: Bilateral;   IMPLANTATION OF HYPOGLOSSAL NERVE STIMULATOR Right 05/17/2024   Procedure: INSERTION, HYPOGLOSSAL NERVE STIMULATOR;  Surgeon: Carlie Clark, MD;  Location: [facility];  Service: ENT;  Laterality: Right;   LEFT HEART CATH AND CORONARY ANGIOGRAPHY N/A 08/14/2021   Procedure: LEFT HEART CATH AND CORONARY ANGIOGRAPHY;  Surgeon: Elmira Newman PARAS, MD;  Location: [facility];  Service: Cardiovascular;  Laterality: N/A;   NASAL FRACTURE SURGERY     POLYPECTOMY  04/19/2020   Procedure: POLYPECTOMY;  Surgeon: Kristie Lamprey, MD;  Location: [facility];  Service: Endoscopy;;   SHOULDER ARTHROSCOPY Right    TONSILLECTOMY      Family History  Problem Relation Age of Onset   Atrial fibrillation Mother 1   Hypertension Mother    Heart attack Father 47   Atrial fibrillation Sister 23   Atrial fibrillation Brother 50   Hypertension Brother     Social History   Socioeconomic History   Marital status: Married    Spouse name: Alm   Number of children: 3   Years of education: 4   Highest education level: Not on file  Occupational History    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  Tobacco Use   Smoking status: Never   Smokeless tobacco: Never  Vaping Use   Vaping status: Never Used  Substance  and Sexual Activity   Alcohol use: Not Currently   Drug use: No   Sexual activity: Yes    Birth control/protection: Surgical    Comment: hyst  Other Topics Concern   Not on file  Social History Narrative   Lives with husband   Caffeine - none   Social Drivers of Health   Tobacco Use: Low Risk (08/23/2024)   Patient History    Smoking Tobacco Use: Never    Smokeless Tobacco Use: Never    Passive Exposure: Not on file  Financial Resource Strain: Low Risk (11/11/2022)   Received from [facility] Short Social Needs Screening - Medical Financial Resource Strain    Would you like help with any of the following needs? (Select ALL that apply): I don't want help with any of these  Food Insecurity: Low Risk (04/18/2024)   Received from [facility]   Epic    Within the past 12 months, you  worried that your food would run out before you got money to buy more: Never true    Within the past 12 months, the food you bought just didn't last and you didn't have money to get more. : Never true  Transportation Needs: No Transportation Needs (04/18/2024)   Received from [facility]    In the past 12 months, has lack of reliable transportation kept you from medical appointments, meetings, work or from getting things needed for daily living? : No  Physical Activity: Not on file  Stress: Not on file  Social Connections: Socially Integrated (04/16/2024)   Social Connection and Isolation Panel    Frequency of Communication with Friends and Family: Three times a week    Frequency of Social Gatherings with Friends and Family: Three times a week    Attends Religious Services: More than 4 times per year    Active Member of Clubs or Organizations: Yes    Attends Banker Meetings: More than 4 times per year    Marital Status: Married  Catering Manager Violence: Not At Risk (04/17/2024)   Epic    Fear of Current or Ex-Partner: No    Emotionally Abused: No     Physically Abused: No    Sexually Abused: No  Depression (PHQ2-9): Low Risk (12/26/2021)   Depression (PHQ2-9)    PHQ-2 Score: 0  Alcohol Screen: Not on file  Housing: Low Risk (04/18/2024)   Received from [facility]   Epic    What is your living situation today?: I have a steady place to live    Think about the place you live. Do you have problems with any of the following? Choose all that apply:: None/None on this list  Utilities: Low Risk (04/18/2024)   Received from [facility]   Utilities    In the past 12 months has the electric, gas, oil, or water company threatened to shut off services in your home? : No  Health Literacy: Not on file    Review of Systems  Respiratory:  Positive for apnea.   Psychiatric/Behavioral:  Positive for sleep disturbance.     Vitals:   08/23/24 1451  BP: (!) 143/84  Pulse: 74  SpO2: 98%     Physical Exam Constitutional:      Appearance: Normal appearance.  HENT:     Head: Normocephalic.     Mouth/Throat:     Mouth: Mucous membranes are moist.  Eyes:     Pupils: Pupils are equal, round, and reactive to light.  Cardiovascular:     Rate and Rhythm: Normal rate and regular rhythm.     Heart sounds: No murmur heard.    No friction rub.  Pulmonary:     Effort: No respiratory distress.     Breath sounds: No stridor. No wheezing or rhonchi.  Musculoskeletal:     Cervical back: No rigidity or tenderness.  Neurological:     Mental Status: She is alert.  Psychiatric:        Mood and Affect: Mood normal.    Data Reviewed: Device was interrogated today and activation was performed  Tongue movement threshold-0.7 Functional range-0.71.7 Initial amplitude-0.7 Delay time-60 minutes Pause duration-15 minutes Therapy duration-8 hours  Assessment and Plan Assessment & Plan Obstructive sleep apnea Managed with a device to stimulate the hypoglossal nerve. Initial activation was performed, and the device was set to a starting amplitude  of 0.7. The device is expected to synchronize with breathing during sleep,  providing a smoother and slower stimulation. She was educated on the device's operation, including the ability to pause it if it interferes with sleep onset or if she wakes up during the night. The goal is to find a comfortable amplitude that does not disrupt sleep. - Set device amplitude to 0.7. - Set pause time to 15 minutes. - Set treatment duration to 8 hours. - Educated on device operation and adjustments.  Insomnia Managed with trazodone , which she reports is effective but takes time to kick in. She is advised to adjust the timing of the medication to find the optimal time for sleep onset. The goal is to minimize side effects while ensuring effective sleep. She is encouraged to skip doses on days when she feels rested and does not require medication. - Refilled trazodone  prescription. - Advised adjusting trazodone  timing to optimize sleep onset. - Encouraged skipping doses on days when rested.  Follow-up in about 4 weeks, has an appointment for December  Graded exercise as tolerated I encouraged to call with significant concerns  Primary hypertension - Well-controlled  Coronary artery disease - Well-controlled  I personally spent a total of 46 minutes in the care of the patient today including preparing to see the patient, performing a medically appropriate exam/evaluation, counseling and educating, placing orders, documenting clinical information in the EHR, independently interpreting results, and interrogating and adjusting settings for inspire device activation.  Jennet Epley MD [facility] 08/23/2024, 3:25 PM  CC: Debrah Josette ORN., PA-C   "

## 2024-08-23 NOTE — Progress Notes (Signed)
 "              Kelly Warner    [id number]    July 14, 1956  Primary Care Physician:Kaplan, Josette ORN., PA-C  Referring Physician: Debrah Josette ORN., PA-C [address]  [place] [address]  Chief complaint:   Follow-up for obstructive sleep apnea   HPI:  Hypoglossal nerve insertion 05/17/2024 Hypoglossal nerve activation 07/27/2024  She has been doing really well Sleeping well, waking up rested Not as tired during the day  Denies any swallowing difficulty, no problems with the tongue Sometimes feels some discomfort at the back of her throat but not limited  Associated insomnia for which she is currently using trazodone  at 50 mg  Onset time of 60 minutes is adequate Therapy duration of 8 hours, pause of 15 minutes as needed  She is overall feeling better with use of the device  Outpatient Encounter Medications as of 08/23/2024  Medication Sig   aspirin  (ASPIRIN  CHILDRENS) 81 MG chewable tablet Chew 1 tablet (81 mg total) by mouth every other day.   atorvastatin  (LIPITOR ) 20 MG tablet Take 20 mg by mouth every evening.   citalopram  (CELEXA ) 10 MG tablet Take 10 mg by mouth daily.   dexlansoprazole (DEXILANT) 60 MG capsule Take 60 mg by mouth at bedtime.   EPINEPHrine  0.3 mg/0.3 mL IJ SOAJ injection Inject 0.3 mg into the muscle as needed for anaphylaxis.   fexofenadine (ALLEGRA) 180 MG tablet Take 180 mg by mouth as needed.   HYDROcodone -acetaminophen  (NORCO/VICODIN) 5-325 MG tablet Take 1-2 tablets by mouth every 6 (six) hours as needed.   levothyroxine  (SYNTHROID ) 25 MCG tablet Take 25 mcg by mouth daily before breakfast.   mometasone (NASONEX) 50 MCG/ACT nasal spray Place 2 sprays into the nose as needed.   nitroGLYCERIN  (NITROSTAT ) 0.4 MG SL tablet Place 1 tablet (0.4 mg total) under the tongue every 5 (five) minutes as needed for chest pain.   Polyethyl Glycol-Propyl Glycol (SYSTANE) 0.4-0.3 % SOLN Place 1-2 drops into both eyes 3 (three) times daily as needed  (dry/irritated eyes.).   ramelteon  (ROZEREM ) 8 MG tablet Take 1 tablet (8 mg total) by mouth at bedtime.   Suvorexant  (BELSOMRA ) 10 MG TABS Take 1 tablet (10 mg total) by mouth at bedtime.   traZODone  (DESYREL ) 50 MG tablet Take 1 tablet (50 mg total) by mouth at bedtime.   Vitamin D, Ergocalciferol, (DRISDOL) 1.25 MG (50000 UNIT) CAPS capsule Take 50,000 Units by mouth once a week.   WEGOVY 0.25 MG/0.5ML SOAJ Inject into the skin.   No facility-administered encounter medications on file as of 08/23/2024.    Allergies as of 08/23/2024 - Review Complete 08/23/2024  Allergen Reaction Noted   Wasp venom Anaphylaxis 05/17/2024   Isosorbide  nitrate Other (See Comments) 08/21/2021   Oxybutynin Other (See Comments) 08/21/2022   Honey bee venom  08/23/2024   Lisinopril Other (See Comments) 08/21/2022   Wasp venom protein  08/23/2024   Lactose intolerance (gi) Other (See Comments) 04/09/2020    Past Medical History:  Diagnosis Date   Anxiety    Arthritis    hands   Coronary artery disease    GERD (gastroesophageal reflux disease)    Hypertension    Hypothyroidism    Migraines    NSTEMI (non-ST elevation myocardial infarction) (HCC)    Sleep apnea    no CPAP   Stroke (HCC) 2018   no deficits    Past Surgical History:  Procedure Laterality Date   ABDOMINAL HYSTERECTOMY  CARDIAC CATHETERIZATION     COLONOSCOPY WITH PROPOFOL  N/A 04/19/2020   Procedure: COLONOSCOPY WITH PROPOFOL ;  Surgeon: Kristie Lamprey, MD;  Location: [facility];  Service: Endoscopy;  Laterality: N/A;   DRUG INDUCED ENDOSCOPY Bilateral 11/26/2023   Procedure: DRUG INDUCED SLEEP ENDOSCOPY;  Surgeon: Carlie Clark, MD;  Location: [facility]COPY;  Service: ENT;  Laterality: Bilateral;   IMPLANTATION OF HYPOGLOSSAL NERVE STIMULATOR Right 05/17/2024   Procedure: INSERTION, HYPOGLOSSAL NERVE STIMULATOR;  Surgeon: Carlie Clark, MD;  Location: [facility];  Service: ENT;  Laterality: Right;   LEFT HEART CATH  AND CORONARY ANGIOGRAPHY N/A 08/14/2021   Procedure: LEFT HEART CATH AND CORONARY ANGIOGRAPHY;  Surgeon: Elmira Newman PARAS, MD;  Location: [facility];  Service: Cardiovascular;  Laterality: N/A;   NASAL FRACTURE SURGERY     POLYPECTOMY  04/19/2020   Procedure: POLYPECTOMY;  Surgeon: Kristie Lamprey, MD;  Location: [facility];  Service: Endoscopy;;   SHOULDER ARTHROSCOPY Right    TONSILLECTOMY      Family History  Problem Relation Age of Onset   Atrial fibrillation Mother 73   Hypertension Mother    Heart attack Father 20   Atrial fibrillation Sister 43   Atrial fibrillation Brother 50   Hypertension Brother     Social History   Socioeconomic History   Marital status: Married    Spouse name: Kelly Warner   Number of children: 3   Years of education: 4   Highest education level: Not on file  Occupational History    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  Tobacco Use   Smoking status: Never   Smokeless tobacco: Never  Vaping Use   Vaping status: Never Used  Substance and Sexual Activity   Alcohol use: Not Currently   Drug use: No   Sexual activity: Yes    Birth control/protection: Surgical    Comment: hyst  Other Topics Concern   Not on file  Social History Narrative   Lives with husband   Caffeine - none   Social Drivers of Health   Tobacco Use: Low Risk (08/23/2024)   Patient History    Smoking Tobacco Use: Never    Smokeless Tobacco Use: Never    Passive Exposure: Not on file  Financial Resource Strain: Low Risk (11/11/2022)   Received from [facility] Short Social Needs Screening - Medical Financial Resource Strain    Would you like help with any of the following needs? (Select ALL that apply): I don't want help with any of these  Food Insecurity: Low Risk (04/18/2024)   Received from [facility]   Epic    Within the past 12 months, you worried that your food would run out before you got money to buy more: Never true    Within the  past 12 months, the food you bought just didn't last and you didn't have money to get more. : Never true  Transportation Needs: No Transportation Needs (04/18/2024)   Received from [employer]    In the past 12 months, has lack of reliable transportation kept you from medical appointments, meetings, work or from getting things needed for daily living? : No  Physical Activity: Not on file  Stress: Not on file  Social Connections: Socially Integrated (04/16/2024)   Social Connection and Isolation Panel    Frequency of Communication with Friends and Family: Three times a week    Frequency of Social Gatherings with Friends and Family: Three times  a week    Attends Religious Services: More than 4 times per year    Active Member of Clubs or Organizations: Yes    Attends Banker Meetings: More than 4 times per year    Marital Status: Married  Catering Manager Violence: Not At Risk (04/17/2024)   Epic    Fear of Current or Ex-Partner: No    Emotionally Abused: No    Physically Abused: No    Sexually Abused: No  Depression (PHQ2-9): Low Risk (12/26/2021)   Depression (PHQ2-9)    PHQ-2 Score: 0  Alcohol Screen: Not on file  Housing: Low Risk (04/18/2024)   Received from [facility]   Epic    What is your living situation today?: I have a steady place to live    Think about the place you live. Do you have problems with any of the following? Choose all that apply:: None/None on this list  Utilities: Low Risk (04/18/2024)   Received from [facility]   Utilities    In the past 12 months has the electric, gas, oil, or water company threatened to shut off services in your home? : No  Health Literacy: Not on file    Review of Systems  Respiratory:  Negative for shortness of breath.     Vitals:   08/23/24 1451  BP: (!) 143/84  Pulse: 74  SpO2: 98%     Physical Exam Constitutional:      Appearance: Normal appearance. She is obese.  HENT:     Head:  Normocephalic.     Mouth/Throat:     Mouth: Mucous membranes are moist.  Eyes:     General: No scleral icterus. Cardiovascular:     Rate and Rhythm: Normal rate and regular rhythm.     Heart sounds: No murmur heard.    No friction rub.  Pulmonary:     Effort: No respiratory distress.     Breath sounds: No stridor. No wheezing or rhonchi.  Neurological:     General: No focal deficit present.     Mental Status: She is alert.  Psychiatric:        Mood and Affect: Mood normal.    Data Reviewed: Device was interrogated during the office visit today Currently at amplitude of 1, tried amplitude of 1.1 and it was tolerated   Assessment/Plan: Obstructive sleep apnea  Hypoglossal nerve stimulator in place and tolerated well  Insomnia - On trazodone  at 50 mg -Tolerating this well  Delay time of 60 minutes Pause duration of 50 minutes Therapy duration 8 hours Amplitude currently 1.0, she will be increasing 12/24  Graded exercise as tolerated  Hypertension - Controlled  Coronary artery disease Controlled  Follow-up in about 4 to 6 weeks Will make a decision about when to order a sleep study at the next visit  Jennet Epley MD [facility] 08/23/2024, 3:59 PM  CC: Debrah Josette ORN., PA-C   "

## 2024-08-23 NOTE — Patient Instructions (Signed)
 I will see you back in about 4 weeks  Advance to levels as tolerated You can stay at the same level if you have any difficulty or discomfort  Continue use of trazodone   Call us  with significant concerns  We will plan your sleep study following the next visit depending on how things are going at the time

## 2024-09-08 ENCOUNTER — Other Ambulatory Visit: Payer: Self-pay | Admitting: Pulmonary Disease

## 2024-09-08 DIAGNOSIS — G4733 Obstructive sleep apnea (adult) (pediatric): Secondary | ICD-10-CM

## 2024-09-20 ENCOUNTER — Encounter: Payer: Self-pay | Admitting: Pulmonary Disease

## 2024-09-20 ENCOUNTER — Ambulatory Visit: Admitting: Pulmonary Disease

## 2024-09-20 VITALS — BP 113/72 | HR 78 | Ht 64.0 in | Wt 150.2 lb

## 2024-09-20 DIAGNOSIS — I10 Essential (primary) hypertension: Secondary | ICD-10-CM | POA: Diagnosis not present

## 2024-09-20 DIAGNOSIS — G4733 Obstructive sleep apnea (adult) (pediatric): Secondary | ICD-10-CM

## 2024-09-20 DIAGNOSIS — I2510 Atherosclerotic heart disease of native coronary artery without angina pectoris: Secondary | ICD-10-CM

## 2024-09-20 DIAGNOSIS — Z9682 Presence of neurostimulator: Secondary | ICD-10-CM

## 2024-09-20 DIAGNOSIS — G4700 Insomnia, unspecified: Secondary | ICD-10-CM | POA: Diagnosis not present

## 2024-09-20 NOTE — Patient Instructions (Signed)
 Will see you back after your sleep study  Sleep study scheduled for March 17  Will see you back end of March  The only change we made to your device today is increasing the pause time to 20 minutes  You can try to go up to level 6 from level 5 as tolerated  Call us  with significant concerns  Continue using your trazodone , he can try half the dose to see if that is tolerated

## 2024-09-20 NOTE — Progress Notes (Signed)
 "              Kelly Warner    [id number]    05/07/1956  Primary Care Physician:Kaplan, Josette ORN., PA-C  Referring Physician: Debrah Josette ORN., PA-C [address]  [place] [address]  Chief complaint:   Follow-up for obstructive sleep apnea Has been doing well  HPI:  Post hypoglossal nerve stimulator placement  She had a hypoglossal nerve insertion 05/17/2024 Hypoglossal nerve activation 07/27/2024  Has been gradually adjusting amplitude Comes in today with an amplitude of 1.1  She has a sleep study scheduled for March 17 An in-lab study  Trazodone  is helping her fall asleep Sometimes she feels the dose may be too high and she is considered going to a lower dose which I encouraged her to do, I believe she is safe trying 25 or 50 mg  Denies any swallowing difficulty, no problems with the tongue No significant discomfort at the back of her throat  She feels she is sleeping better, waking up feeling better  She may get tired about 3:00 and take a nap for about 30 minutes otherwise energy levels are much better  Onset time of 60 minutes is adequate Therapy duration of 8 hours, pause of 15 minutes as needed   Outpatient Encounter Medications as of 09/20/2024  Medication Sig   aspirin  (ASPIRIN  CHILDRENS) 81 MG chewable tablet Chew 1 tablet (81 mg total) by mouth every other day.   atorvastatin  (LIPITOR ) 20 MG tablet Take 20 mg by mouth every evening.   citalopram  (CELEXA ) 10 MG tablet Take 10 mg by mouth daily.   dexlansoprazole (DEXILANT) 60 MG capsule Take 60 mg by mouth at bedtime.   EPINEPHrine  0.3 mg/0.3 mL IJ SOAJ injection Inject 0.3 mg into the muscle as needed for anaphylaxis.   HYDROcodone -acetaminophen  (NORCO/VICODIN) 5-325 MG tablet Take 1-2 tablets by mouth every 6 (six) hours as needed.   levothyroxine  (SYNTHROID ) 25 MCG tablet Take 25 mcg by mouth daily before breakfast.   nitroGLYCERIN  (NITROSTAT ) 0.4 MG SL tablet Place 1 tablet (0.4 mg total)  under the tongue every 5 (five) minutes as needed for chest pain.   Polyethyl Glycol-Propyl Glycol (SYSTANE) 0.4-0.3 % SOLN Place 1-2 drops into both eyes 3 (three) times daily as needed (dry/irritated eyes.).   traZODone  (DESYREL ) 50 MG tablet Take 1 tablet (50 mg total) by mouth at bedtime.   Vitamin D, Ergocalciferol, (DRISDOL) 1.25 MG (50000 UNIT) CAPS capsule Take 50,000 Units by mouth once a week.   WEGOVY 0.25 MG/0.5ML SOAJ Inject into the skin.   fexofenadine (ALLEGRA) 180 MG tablet Take 180 mg by mouth as needed. (Patient not taking: Reported on 09/20/2024)   mometasone (NASONEX) 50 MCG/ACT nasal spray Place 2 sprays into the nose as needed. (Patient not taking: Reported on 09/20/2024)   ramelteon  (ROZEREM ) 8 MG tablet Take 1 tablet (8 mg total) by mouth at bedtime. (Patient not taking: Reported on 09/20/2024)   Suvorexant  (BELSOMRA ) 10 MG TABS Take 1 tablet (10 mg total) by mouth at bedtime. (Patient not taking: Reported on 09/20/2024)   No facility-administered encounter medications on file as of 09/20/2024.    Allergies as of 09/20/2024 - Review Complete 09/20/2024  Allergen Reaction Noted   Fire ant Swelling 09/20/2024   Wasp venom Anaphylaxis 05/17/2024   Isosorbide  nitrate Other (See Comments) 08/21/2021   Oxybutynin Other (See Comments) 08/21/2022   Honey bee venom  08/23/2024   Lisinopril Other (See Comments) 08/21/2022   Wasp venom protein  08/23/2024   Lactose intolerance (gi) Other (See Comments) 04/09/2020    Past Medical History:  Diagnosis Date   Anxiety    Arthritis    hands   Coronary artery disease    GERD (gastroesophageal reflux disease)    Hypertension    Hypothyroidism    Migraines    NSTEMI (non-ST elevation myocardial infarction) (HCC)    Sleep apnea    no CPAP   Stroke (HCC) 2018   no deficits    Past Surgical History:  Procedure Laterality Date   ABDOMINAL HYSTERECTOMY     CARDIAC CATHETERIZATION     COLONOSCOPY WITH PROPOFOL  N/A 04/19/2020    Procedure: COLONOSCOPY WITH PROPOFOL ;  Surgeon: Kristie Lamprey, MD;  Location: [facility];  Service: Endoscopy;  Laterality: N/A;   DRUG INDUCED ENDOSCOPY Bilateral 11/26/2023   Procedure: DRUG INDUCED SLEEP ENDOSCOPY;  Surgeon: Carlie Clark, MD;  Location: [facility];  Service: ENT;  Laterality: Bilateral;   IMPLANTATION OF HYPOGLOSSAL NERVE STIMULATOR Right 05/17/2024   Procedure: INSERTION, HYPOGLOSSAL NERVE STIMULATOR;  Surgeon: Carlie Clark, MD;  Location: [facility];  Service: ENT;  Laterality: Right;   LEFT HEART CATH AND CORONARY ANGIOGRAPHY N/A 08/14/2021   Procedure: LEFT HEART CATH AND CORONARY ANGIOGRAPHY;  Surgeon: Elmira Newman PARAS, MD;  Location: [facility];  Service: Cardiovascular;  Laterality: N/A;   NASAL FRACTURE SURGERY     POLYPECTOMY  04/19/2020   Procedure: POLYPECTOMY;  Surgeon: Kristie Lamprey, MD;  Location: [facility];  Service: Endoscopy;;   SHOULDER ARTHROSCOPY Right    TONSILLECTOMY      Family History  Problem Relation Age of Onset   Atrial fibrillation Mother 70   Hypertension Mother    Heart attack Father 43   Atrial fibrillation Sister 72   Atrial fibrillation Brother 50   Hypertension Brother     Social History   Socioeconomic History   Marital status: Married    Spouse name: Alm   Number of children: 3   Years of education: 4   Highest education level: Not on file  Occupational History    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  Tobacco Use   Smoking status: Never   Smokeless tobacco: Never  Vaping Use   Vaping status: Never Used  Substance and Sexual Activity   Alcohol use: Not Currently   Drug use: No   Sexual activity: Yes    Birth control/protection: Surgical    Comment: hyst  Other Topics Concern   Not on file  Social History Narrative   Lives with husband   Caffeine - none   Social Drivers of Health   Tobacco Use: Low Risk (09/20/2024)   Patient History    Smoking Tobacco Use:  Never    Smokeless Tobacco Use: Never    Passive Exposure: Not on file  Financial Resource Strain: Low Risk (11/11/2022)   Received from [facility] Short Social Needs Screening - Medical Financial Resource Strain    Would you like help with any of the following needs? (Select ALL that apply): I don't want help with any of these  Food Insecurity: Low Risk (09/15/2024)   Received from [facility]   Epic    Within the past 12 months, you worried that your food would run out before you got money to buy more: Never true    Within the past 12 months, the food you bought just didn't last and you didn't have money to get more. :  Never true  Transportation Needs: No Transportation Needs (09/15/2024)   Received from [employer]    In the past 12 months, has lack of reliable transportation kept you from medical appointments, meetings, work or from getting things needed for daily living? : No  Physical Activity: Not on file  Stress: Not on file  Social Connections: Socially Integrated (04/16/2024)   Social Connection and Isolation Panel    Frequency of Communication with Friends and Family: Three times a week    Frequency of Social Gatherings with Friends and Family: Three times a week    Attends Religious Services: More than 4 times per year    Active Member of Clubs or Organizations: Yes    Attends Banker Meetings: More than 4 times per year    Marital Status: Married  Catering Manager Violence: Not At Risk (04/17/2024)   Epic    Fear of Current or Ex-Partner: No    Emotionally Abused: No    Physically Abused: No    Sexually Abused: No  Depression (PHQ2-9): Low Risk (12/26/2021)   Depression (PHQ2-9)    PHQ-2 Score: 0  Alcohol Screen: Not on file  Housing: Low Risk (09/15/2024)   Received from [facility]   Epic    What is your living situation today?: I have a steady place to live    Think about the place you live. Do you have problems with any of  the following? Choose all that apply:: None/None on this list  Utilities: Low Risk (09/15/2024)   Received from [facility]   Utilities    In the past 12 months has the electric, gas, oil, or water company threatened to shut off services in your home? : No  Health Literacy: Not on file    Review of Systems  Respiratory:  Positive for apnea. Negative for shortness of breath.   Psychiatric/Behavioral:  Positive for sleep disturbance.     Vitals:   09/20/24 1403  BP: 113/72  Pulse: 78  SpO2: 94%     Physical Exam Constitutional:      Appearance: Normal appearance.  HENT:     Head: Normocephalic.     Nose: Nose normal.     Mouth/Throat:     Mouth: Mucous membranes are moist.  Eyes:     General: No scleral icterus. Cardiovascular:     Rate and Rhythm: Normal rate and regular rhythm.     Heart sounds: No murmur heard.    No friction rub.  Pulmonary:     Effort: No respiratory distress.     Breath sounds: No stridor. No wheezing or rhonchi.  Musculoskeletal:     Cervical back: No rigidity or tenderness.  Neurological:     General: No focal deficit present.     Mental Status: She is alert.  Psychiatric:        Mood and Affect: Mood normal.    Data Reviewed: Device was interrogated during the office visit today  She is currently on an amplitude of 1.1, range of 0.7-1.7 Delay time of 60 minutes, duration of 8 hours, pause was changed to 20 minutes  Assessment/Plan: Obstructive sleep apnea  Hypoglossal nerve stimulator in place and tolerated well  Insomnia - She is on trazodone  and tolerating this okay  Only changes to the device today was changing the pause time to 20 minutes  Encouraged to continue graded exercises as tolerated  Hypertension is controlled  Coronary artery disease - Stable  Follow-up will  be a couple of weeks after sleep study in March   I personally spent a total of 40 minutes in the care of the patient today including preparing to see  the patient, getting/reviewing separately obtained history, performing a medically appropriate exam/evaluation, counseling and educating, placing orders, documenting clinical information in the EHR, and independently interpreting results.    Jennet Epley MD [facility] 09/20/2024, 5:01 PM  CC: Debrah Josette ORN., PA-C   "

## 2024-11-15 ENCOUNTER — Ambulatory Visit (HOSPITAL_BASED_OUTPATIENT_CLINIC_OR_DEPARTMENT_OTHER): Admitting: Pulmonary Disease

## 2024-12-06 ENCOUNTER — Encounter: Admitting: Pulmonary Disease
